# Patient Record
Sex: Male | Born: 1960
Health system: Southern US, Community
[De-identification: ages and names within clinical notes are randomized; demographics above are authoritative.]

## PROBLEM LIST (undated history)

## (undated) DIAGNOSIS — E119 Type 2 diabetes mellitus without complications: Secondary | ICD-10-CM

## (undated) DIAGNOSIS — Z87891 Personal history of nicotine dependence: Secondary | ICD-10-CM

## (undated) DIAGNOSIS — I639 Cerebral infarction, unspecified: Secondary | ICD-10-CM

## (undated) DIAGNOSIS — G4730 Sleep apnea, unspecified: Secondary | ICD-10-CM

## (undated) DIAGNOSIS — M109 Gout, unspecified: Secondary | ICD-10-CM

## (undated) DIAGNOSIS — E785 Hyperlipidemia, unspecified: Secondary | ICD-10-CM

## (undated) DIAGNOSIS — Z972 Presence of dental prosthetic device (complete) (partial): Secondary | ICD-10-CM

## (undated) DIAGNOSIS — E669 Obesity, unspecified: Secondary | ICD-10-CM

## (undated) DIAGNOSIS — I10 Essential (primary) hypertension: Secondary | ICD-10-CM

## (undated) HISTORY — DX: Hyperlipidemia, unspecified: E78.5

## (undated) HISTORY — DX: Personal history of nicotine dependence: Z87.891

## (undated) HISTORY — DX: Presence of dental prosthetic device (complete) (partial): Z97.2

## (undated) HISTORY — DX: Type 2 diabetes mellitus without complications: E11.9

## (undated) HISTORY — DX: Obesity, unspecified: E66.9

## (undated) HISTORY — DX: Cerebral infarction, unspecified: I63.9

## (undated) HISTORY — DX: Essential (primary) hypertension: I10

## (undated) HISTORY — PX: MOUTH SURGERY: SHX715

## (undated) HISTORY — PX: WISDOM TOOTH EXTRACTION: SHX21

## (undated) HISTORY — DX: Gout, unspecified: M10.9

## (undated) HISTORY — DX: Sleep apnea, unspecified: G47.30

---

## 2011-07-07 ENCOUNTER — Encounter: Payer: Self-pay | Admitting: Family Medicine

## 2011-07-07 ENCOUNTER — Ambulatory Visit (INDEPENDENT_AMBULATORY_CARE_PROVIDER_SITE_OTHER): Payer: BC Managed Care – PPO | Admitting: Family Medicine

## 2011-07-07 VITALS — BP 150/100 | HR 68 | Temp 98.8°F | Ht 68.0 in | Wt 234.8 lb

## 2011-07-07 DIAGNOSIS — I10 Essential (primary) hypertension: Secondary | ICD-10-CM | POA: Insufficient documentation

## 2011-07-07 DIAGNOSIS — R21 Rash and other nonspecific skin eruption: Secondary | ICD-10-CM | POA: Insufficient documentation

## 2011-07-07 NOTE — Patient Instructions (Signed)
Good to meet you today, call us with questions. Return at your convenience fasting for blood work, afterwards for physical. Pass by Marion's office to schedule dermatology appointment. I will request records from prior doc.

## 2011-07-07 NOTE — Assessment & Plan Note (Signed)
Longstanding.  Unclear etiology.  Not clearly fungal or eczematous.  Nonvesicular.   ?rxn to rubber vs eczematous dermatitis although papular/pustular description not consistent with this or in typical distribution of eczema.   lichenification behind ears and mid back patch from excessive scratching. Not improved with low potency steroid. Will refer to derm for further input.

## 2011-07-07 NOTE — Assessment & Plan Note (Addendum)
Discussed monitoring bp at home, if staying elevated to return sooner.  Pt endorses compliance with meds, states bp always elevated with doctor visits, states at home runs 130/80s. Return fasting for bloodwork and afterwards for CPE.

## 2011-07-07 NOTE — Progress Notes (Signed)
Subjective:    Patient ID: Thomas Howard, male    DOB: August 15, 1960, 51 y.o.   MRN: [id number]  HPI CC: new pt, rash  Prior saw Dr. Iris Pert in [place].  Rash - started last summer, thought mosquito bites.  Never fully healed.  Stinging and itchy rash.  Started left leg spreading to arms and torso and scalp now.  No new lotions, detergents, soaps, shampoos.  Worse itch with hot weather.  No fevers/chills, abd pain, n/v, oral lesions, joint pains.  States lesions start out as pimples.  Has been using vaseline to keep from skin drying.   Has been using cortisone (cortaid and caldecort) and anti itch creams which help but only temporarily. No h/o eczema.  Works for [employer] ?rubber rash.  Preventative: Tetanus unsure  Caffeine: sodas - 6 pack/day (pepsi, mt dew, ginger ale) Lives with GF, 1 cat.  Divorced Occupation: [employer] Activity: [employer] Diet: some water, rare fruits/vegetables  Medications and allergies reviewed and updated in chart.  Past histories reviewed and updated if relevant as below. Patient Active Problem List  Diagnoses  . Skin rash   Past Medical History  Diagnosis Date  . HTN (hypertension)   . Wears partial dentures    Past Surgical History  Procedure Date  . Wisdom tooth extraction   . Mouth surgery    History  Substance Use Topics  . Smoking status: Former Smoker    Types: Cigarettes    Quit date: 03/12/2011  . Smokeless tobacco: Never Used   Comment: still use e-cigs  . Alcohol Use: Yes     Occasional   Family History  Problem Relation Age of Onset  . Diabetes Mother   . Hypertension Mother   . Diabetes Maternal Grandmother   . Hypertension Maternal Grandmother   . Coronary artery disease Neg Hx   . Stroke Neg Hx   . Cancer Neg Hx    No Known Allergies No current outpatient prescriptions on file prior to visit.   Review of Systems  Constitutional: Negative for fever, chills, activity  change, appetite change, fatigue and unexpected weight change.  HENT: Negative for hearing loss and neck pain.   Eyes: Negative for visual disturbance.  Respiratory: Negative for cough, chest tightness, shortness of breath and wheezing.   Cardiovascular: Negative for chest pain, palpitations and leg swelling.  Gastrointestinal: Negative for nausea, vomiting, abdominal pain, diarrhea, constipation, blood in stool and abdominal distention.  Genitourinary: Negative for hematuria and difficulty urinating.  Musculoskeletal: Negative for myalgias and arthralgias.  Skin: Negative for rash.  Neurological: Negative for dizziness, seizures, syncope and headaches.  Hematological: Does not bruise/bleed easily.  Psychiatric/Behavioral: Negative for dysphoric mood. The patient is not nervous/anxious.        Objective:   Physical Exam  Nursing note and vitals reviewed. Constitutional: He is oriented to person, place, and time. He appears well-developed and well-nourished. No distress.  HENT:  Head: Normocephalic and atraumatic.  Right Ear: External ear normal.  Left Ear: External ear normal.  Nose: Nose normal.  Mouth/Throat: Oropharynx is clear and moist. No oropharyngeal exudate.  Eyes: Conjunctivae and EOM are normal. Pupils are equal, round, and reactive to light. No scleral icterus.  Neck: Normal range of motion. Neck supple. No thyromegaly present.  Cardiovascular: Normal rate, regular rhythm, normal heart sounds and intact distal pulses.   No murmur heard. Pulses:      Radial pulses are 2+ on the right side, and  2+ on the left side.  Pulmonary/Chest: Effort normal and breath sounds normal. No respiratory distress. He has no wheezes. He has no rales.  Abdominal: Soft. Bowel sounds are normal. He exhibits no distension and no mass. There is no tenderness. There is no rebound and no guarding.  Musculoskeletal: Normal range of motion. He exhibits no edema.  Lymphadenopathy:    He has no  cervical adenopathy.  Neurological: He is alert and oriented to person, place, and time.       CN grossly intact, station and gait intact  Skin: Skin is warm and dry. No rash noted.       Pruritic papular rash throughout body, old lesions with residual hyperpigmentation more on back and left leg.  Some papules on scalp as well. Patch of eczematous skin midline lower thoracic back. Some skin thickening posterior to bilateral ears L>R.  Psychiatric: He has a normal mood and affect. His behavior is normal. Judgment and thought content normal.      Assessment & Plan:

## 2011-12-15 ENCOUNTER — Encounter: Payer: Self-pay | Admitting: Family Medicine

## 2011-12-15 ENCOUNTER — Ambulatory Visit (INDEPENDENT_AMBULATORY_CARE_PROVIDER_SITE_OTHER): Payer: BC Managed Care – PPO | Admitting: Family Medicine

## 2011-12-15 VITALS — BP 138/84 | HR 78 | Temp 98.2°F | Wt 240.8 lb

## 2011-12-15 DIAGNOSIS — I10 Essential (primary) hypertension: Secondary | ICD-10-CM

## 2011-12-15 DIAGNOSIS — R21 Rash and other nonspecific skin eruption: Secondary | ICD-10-CM

## 2011-12-15 DIAGNOSIS — E669 Obesity, unspecified: Secondary | ICD-10-CM

## 2011-12-15 LAB — BASIC METABOLIC PANEL
BUN: 13 mg/dL (ref 6–23)
CO2: 29 mEq/L (ref 19–32)
Chloride: 99 mEq/L (ref 96–112)
Creatinine, Ser: 1 mg/dL (ref 0.4–1.5)
Glucose, Bld: 117 mg/dL — ABNORMAL HIGH (ref 70–99)
Potassium: 3.9 mEq/L (ref 3.5–5.1)

## 2011-12-15 LAB — TSH: TSH: 1.13 u[IU]/mL (ref 0.35–5.50)

## 2011-12-15 LAB — LIPID PANEL
Cholesterol: 184 mg/dL (ref 0–200)
LDL Cholesterol: 123 mg/dL — ABNORMAL HIGH (ref 0–99)
Triglycerides: 79 mg/dL (ref 0.0–149.0)

## 2011-12-15 MED ORDER — ENALAPRIL MALEATE 20 MG PO TABS: 20.0000 mg | ORAL_TABLET | Freq: Every day | ORAL | Status: DC

## 2011-12-15 MED ORDER — HYDROCHLOROTHIAZIDE 25 MG PO TABS: 25.0000 mg | ORAL_TABLET | Freq: Every day | ORAL | Status: DC | PRN

## 2011-12-15 NOTE — Assessment & Plan Note (Signed)
Has seen derm for this.

## 2011-12-15 NOTE — Progress Notes (Signed)
  Subjective:    Patient ID: Thomas Howard, male    DOB: Nov 02, 1960, 51 y.o.   MRN: [id number]  HPI CC: med refill  HTN - compliant enalapril and hydrochlorothiazide.  No HA, vision changes, CP/tightness, SOB, leg swelling.  Last vision check was last year.  Trouble with near vision.   BP Readings from Last 3 Encounters:  12/15/11 138/84  07/07/11 150/100   Smoking - still abstinent.  Wt Readings from Last 3 Encounters:  12/15/11 240 lb 12 oz (109.203 kg)  07/07/11 234 lb 12 oz (106.482 kg)  up 6 lbs.  Attributes to increased appetite after quitting smoking. Has increased water compared to sodas.  Works second shift.  Takes bp meds at ~12am. Waking up to go to bathroom 1x/night. Wonders about frequency.    fmhx diabetes and HTN in family.  Last CPE unsure. Fasting today.  Review of Systems Per HPI    Objective:   Physical Exam  Nursing note and vitals reviewed. Constitutional: He appears well-developed and well-nourished. No distress.  HENT:  Head: Normocephalic and atraumatic.  Mouth/Throat: Oropharynx is clear and moist. No oropharyngeal exudate.  Eyes: Conjunctivae and EOM are normal. Pupils are equal, round, and reactive to light. No scleral icterus.  Neck: Normal range of motion. Neck supple. Carotid bruit is not present.  Cardiovascular: Normal rate, regular rhythm, normal heart sounds and intact distal pulses.   No murmur heard. Pulmonary/Chest: Breath sounds normal. No respiratory distress. He has no wheezes. He has no rales.  Musculoskeletal: He exhibits no edema.  Skin: Skin is warm and dry. No rash noted.       Assessment & Plan:

## 2011-12-15 NOTE — Patient Instructions (Signed)
Blood work today.  We will call you with results. meds refilled for 1 year. Return in 1 year for physical

## 2011-12-15 NOTE — Assessment & Plan Note (Signed)
Weight gain noted. Wt Readings from Last 3 Encounters:  12/15/11 240 lb 12 oz (109.203 kg)  07/07/11 234 lb 12 oz (106.482 kg)  encouraged continued activity, watching diet.  Congratulated on changing sodas to water. Check FLP and glucose today ,as fasting.

## 2011-12-15 NOTE — Assessment & Plan Note (Signed)
Chronic, stable.  Continue meds.  BP Readings from Last 3 Encounters:  12/15/11 138/84  07/07/11 150/100

## 2013-06-20 ENCOUNTER — Ambulatory Visit (INDEPENDENT_AMBULATORY_CARE_PROVIDER_SITE_OTHER): Payer: BC Managed Care – PPO | Admitting: Family Medicine

## 2013-06-20 ENCOUNTER — Encounter: Payer: Self-pay | Admitting: Family Medicine

## 2013-06-20 VITALS — BP 164/102 | HR 82 | Temp 98.2°F | Ht 68.0 in | Wt 246.0 lb

## 2013-06-20 DIAGNOSIS — Z125 Encounter for screening for malignant neoplasm of prostate: Secondary | ICD-10-CM

## 2013-06-20 DIAGNOSIS — R21 Rash and other nonspecific skin eruption: Secondary | ICD-10-CM

## 2013-06-20 DIAGNOSIS — Z0000 Encounter for general adult medical examination without abnormal findings: Secondary | ICD-10-CM

## 2013-06-20 DIAGNOSIS — E669 Obesity, unspecified: Secondary | ICD-10-CM

## 2013-06-20 DIAGNOSIS — Z1211 Encounter for screening for malignant neoplasm of colon: Secondary | ICD-10-CM

## 2013-06-20 DIAGNOSIS — I10 Essential (primary) hypertension: Secondary | ICD-10-CM

## 2013-06-20 DIAGNOSIS — Z23 Encounter for immunization: Secondary | ICD-10-CM

## 2013-06-20 LAB — COMPREHENSIVE METABOLIC PANEL
ALBUMIN: 4 g/dL (ref 3.5–5.2)
ALT: 34 U/L (ref 0–53)
AST: 28 U/L (ref 0–37)
Alkaline Phosphatase: 88 U/L (ref 39–117)
BUN: 13 mg/dL (ref 6–23)
CALCIUM: 9.4 mg/dL (ref 8.4–10.5)
CHLORIDE: 104 meq/L (ref 96–112)
CO2: 26 meq/L (ref 19–32)
Creatinine, Ser: 1 mg/dL (ref 0.4–1.5)
GFR: 98.13 mL/min (ref >60.00)
Glucose, Bld: 123 mg/dL — ABNORMAL HIGH (ref 70–99)
POTASSIUM: 3.9 meq/L (ref 3.5–5.1)
Sodium: 138 mEq/L (ref 135–145)
Total Bilirubin: 1.2 mg/dL (ref 0.3–1.2)
Total Protein: 7.6 g/dL (ref 6.0–8.3)

## 2013-06-20 LAB — CBC WITH DIFFERENTIAL/PLATELET
BASOS PCT: 0.7 % (ref 0.0–3.0)
Basophils Absolute: 0 10*3/uL (ref 0.0–0.1)
EOS ABS: 0.2 10*3/uL (ref 0.0–0.7)
Eosinophils Relative: 4 % (ref 0.0–5.0)
HCT: 44.4 % (ref 39.0–52.0)
HEMOGLOBIN: 14.4 g/dL (ref 13.0–17.0)
LYMPHS PCT: 30.5 % (ref 12.0–46.0)
Lymphs Abs: 1.3 10*3/uL (ref 0.7–4.0)
MCHC: 32.5 g/dL (ref 30.0–36.0)
MCV: 89.1 fl (ref 78.0–100.0)
Monocytes Absolute: 0.3 10*3/uL (ref 0.1–1.0)
Monocytes Relative: 7.7 % (ref 3.0–12.0)
NEUTROS ABS: 2.5 10*3/uL (ref 1.4–7.7)
NEUTROS PCT: 57.1 % (ref 43.0–77.0)
Platelets: 298 10*3/uL (ref 150.0–400.0)
RBC: 4.98 Mil/uL (ref 4.22–5.81)
RDW: 13.5 % (ref 11.5–14.6)
WBC: 4.4 10*3/uL — ABNORMAL LOW (ref 4.5–10.5)

## 2013-06-20 LAB — LIPID PANEL
CHOL/HDL RATIO: 5
Cholesterol: 186 mg/dL (ref 0–200)
HDL: 37.9 mg/dL — ABNORMAL LOW (ref >39.00)
LDL CALC: 125 mg/dL — AB (ref 0–99)
TRIGLYCERIDES: 116 mg/dL (ref 0.0–149.0)
VLDL: 23.2 mg/dL (ref 0.0–40.0)

## 2013-06-20 LAB — PSA: PSA: 0.89 ng/mL (ref 0.10–4.00)

## 2013-06-20 MED ORDER — ENALAPRIL MALEATE 20 MG PO TABS
20.0000 mg | ORAL_TABLET | Freq: Every day | ORAL | Status: DC
Start: 2013-06-20 — End: 2014-06-24

## 2013-06-20 MED ORDER — CLOTRIMAZOLE 1 % EX CREA: 1.0000 "application " | TOPICAL_CREAM | Freq: Two times a day (BID) | CUTANEOUS | Status: DC

## 2013-06-20 MED ORDER — HYDROCHLOROTHIAZIDE 25 MG PO TABS: 25.0000 mg | ORAL_TABLET | Freq: Every day | ORAL | Status: DC | PRN

## 2013-06-20 NOTE — Progress Notes (Signed)
BP 164/102  Pulse 82  Temp(Src) 98.2 F (36.8 C) (Oral)  Ht 5\' 8"  (1.727 m)  Wt 246 lb (111.585 kg)  BMI 37.41 kg/m2   CC: CPE  Subjective:    Patient ID: Thomas Howard, male    DOB: Aug 03, 1960, 53 y.o.   MRN: [id number]  HPI: Thomas Howard is a 53 y.o. male presenting on 06/20/2013 with Annual Exam  HTN - Not compliant with current antihypertensive regimen.  Off meds for over a year.  Does not check blood pressures at home.  No low blood pressure readings or symptoms of dizziness/syncope.  Denies HA, vision changes, CP/tightness, SOB, leg swelling. BP Readings from Last 3 Encounters:  06/20/13 164/102  12/15/11 138/84  07/07/11 150/100   Rash has returned over last 3 months.  Now on bottom and finds significant itching/burning with evacuation that has led to raw skin and some bleeding.  Using medicated wipes (rexall - possibly with witch hazel).  He did see dermatologist for this over a year ago, told dry skin and treated with some topical medicine.  Wt Readings from Last 3 Encounters:  06/20/13 246 lb (111.585 kg)  12/15/11 240 lb 12 oz (109.203 kg)  07/07/11 234 lb 12 oz (106.482 kg)    Preventative:  Colon cancer screening - discussed, would like colonoscopy - will refer Prostate cancer screening - discussed, would like to screen. Tetanus - unsure > 10 yrs ago.  Would like today. Flu shot - declines  Caffeine: sodas - 6 pack/day (pepsi, mt dew, ginger ale)  Lives with GF, 1 cat. Divorced  Occupation: [employer]  Activity: [employer]  Diet: more water, decreasing sodas, some fruits/vegetables   Relevant past medical, surgical, family and social history reviewed and updated. Allergies and medications reviewed and updated. No current outpatient prescriptions on file prior to visit.   No current facility-administered medications on file prior to visit.    Review of Systems  Constitutional: Negative for fever, chills, activity  change, appetite change, fatigue and unexpected weight change.  HENT: Negative for hearing loss.   Eyes: Negative for visual disturbance.  Respiratory: Negative for cough, chest tightness, shortness of breath and wheezing.   Cardiovascular: Negative for chest pain, palpitations and leg swelling.  Gastrointestinal: Negative for nausea, vomiting, abdominal pain, diarrhea, constipation, blood in stool and abdominal distention.  Genitourinary: Negative for hematuria and difficulty urinating.  Musculoskeletal: Negative for arthralgias, myalgias and neck pain.  Skin: Negative for rash.  Neurological: Negative for dizziness, seizures, syncope and headaches.  Hematological: Negative for adenopathy. Does not bruise/bleed easily.  Psychiatric/Behavioral: Negative for dysphoric mood. The patient is not nervous/anxious.    Per HPI unless specifically indicated above    Objective:    BP 164/102  Pulse 82  Temp(Src) 98.2 F (36.8 C) (Oral)  Ht 5\' 8"  (1.727 m)  Wt 246 lb (111.585 kg)  BMI 37.41 kg/m2  Physical Exam  Nursing note and vitals reviewed. Constitutional: He is oriented to person, place, and time. He appears well-developed and well-nourished. No distress.  obese  HENT:  Head: Normocephalic and atraumatic.  Right Ear: Hearing, tympanic membrane, external ear and ear canal normal.  Left Ear: Hearing, tympanic membrane, external ear and ear canal normal.  Nose: Nose normal.  Mouth/Throat: Uvula is midline, oropharynx is clear and moist and mucous membranes are normal. No oropharyngeal exudate, posterior oropharyngeal edema or posterior oropharyngeal erythema.  Eyes: Conjunctivae and EOM are normal. Pupils are equal, round, and  reactive to light. No scleral icterus.  Neck: Normal range of motion. Neck supple.  Cardiovascular: Normal rate, regular rhythm, normal heart sounds and intact distal pulses.   No murmur heard. Pulses:      Radial pulses are 2+ on the right side, and 2+ on the  left side.  Pulmonary/Chest: Effort normal and breath sounds normal. No respiratory distress. He has no wheezes. He has no rales.  Abdominal: Soft. Bowel sounds are normal. He exhibits no distension and no mass. There is no tenderness. There is no rebound and no guarding.  Genitourinary: Rectal exam shows external hemorrhoid (non inflamed anterior). Rectal exam shows no internal hemorrhoid, no fissure, no mass, no tenderness and anal tone normal. Prostate is enlarged (20gm). Prostate is not tender.  Some hyperpigmentation perianally, pruritic  Musculoskeletal: Normal range of motion. He exhibits no edema.  Lymphadenopathy:    He has no cervical adenopathy.  Neurological: He is alert and oriented to person, place, and time.  CN grossly intact, station and gait intact  Skin: Skin is warm and dry. Rash noted.  Hyperpigmented pruritic rash - isolated papules on trunk, extremities - with excoriation  Psychiatric: He has a normal mood and affect. His behavior is normal. Judgment and thought content normal.      Assessment & Plan:   Problem List Items Addressed This Visit   Health care maintenance - Primary     Preventative protocols reviewed and updated unless pt declined. Discussed healthy diet and lifestyle.  DRE reassuring with some BPH evident.  Check PSA. Refer for colonsocopy today    Relevant Orders      CBC with Differential   HTN (hypertension)     Noncompliant with meds - encouraged restarting.  Will restart ACEI for 1 wk then restart HCTZ. Discussed reasons to treat HTN.    Relevant Medications      enalapril (VASOTEC) tablet      hydrochlorothiazide tablet   Other Relevant Orders      Comprehensive metabolic panel   Obesity     Reviewed weight gain.    Relevant Orders      Lipid panel      Comprehensive metabolic panel   Skin rash     Longstanding. Unclear etiology. Comes and goes.  Now affecting perianal region.  ?fungal intertrigo - treat with clotrimazole bid x 3-4  wks. Nonvesicular.  ?eczematous dermatitis If not better with antifungal, will refer back to derm.  Pt agrees.    Relevant Orders      Comprehensive metabolic panel      RPR    Other Visit Diagnoses   Special screening for malignant neoplasm of prostate        Relevant Orders       PSA    Special screening for malignant neoplasms, colon        Relevant Orders       Ambulatory referral to Gastroenterology        Follow up plan: Return in about 3 months (around 09/17/2013), or as needed, for follow up visit.

## 2013-06-20 NOTE — Assessment & Plan Note (Signed)
Longstanding. Unclear etiology. Comes and goes.  Now affecting perianal region.  ?fungal intertrigo - treat with clotrimazole bid x 3-4 wks. Nonvesicular.  ?eczematous dermatitis If not better with antifungal, will refer back to derm.  Pt agrees.

## 2013-06-20 NOTE — Assessment & Plan Note (Addendum)
Noncompliant with meds - encouraged restarting.  Will restart ACEI for 1 wk then restart HCTZ. Discussed reasons to treat HTN. RTC 3 mo for HTN f/u.

## 2013-06-20 NOTE — Addendum Note (Signed)
Addended by: Josph MachoANCE, Zymier Rodgers A on: 06/20/2013 12:24 PM   Modules accepted: Orders

## 2013-06-20 NOTE — Assessment & Plan Note (Signed)
Reviewed weight gain.

## 2013-06-20 NOTE — Assessment & Plan Note (Signed)
Preventative protocols reviewed and updated unless pt declined. Discussed healthy diet and lifestyle.  DRE reassuring with some BPH evident.  Check PSA. Refer for colonsocopy today

## 2013-06-20 NOTE — Patient Instructions (Signed)
Prostate checked today - blood work today. We will call you to schedule screening colonoscopy. Tetanus shot (Tdap). I've refilled your blood pressure medicines.  Restart lisinopril 20mg  daily for 1 week then restart hydrochlorothiazide 25mg  daily.  Monitor blood pressure closely while restarting these medicines. For rash around bottom - let's try antifungal cream for next 2-3 weeks - twice daily.  If not improving with this, I will refer you to skin doctor for further evaluation.

## 2013-06-21 ENCOUNTER — Telehealth: Payer: Self-pay | Admitting: Family Medicine

## 2013-06-21 LAB — RPR

## 2013-06-21 NOTE — Telephone Encounter (Signed)
Relevant patient education mailed to patient.  

## 2013-06-26 ENCOUNTER — Encounter: Payer: Self-pay | Admitting: *Deleted

## 2013-08-03 ENCOUNTER — Encounter: Payer: Self-pay | Admitting: Family Medicine

## 2013-09-18 ENCOUNTER — Ambulatory Visit: Payer: BC Managed Care – PPO | Admitting: Family Medicine

## 2013-09-18 DIAGNOSIS — Z0289 Encounter for other administrative examinations: Secondary | ICD-10-CM

## 2014-06-24 ENCOUNTER — Other Ambulatory Visit: Payer: Self-pay | Admitting: Family Medicine

## 2014-07-31 ENCOUNTER — Other Ambulatory Visit: Payer: Self-pay | Admitting: Family Medicine

## 2017-09-22 LAB — CBC AND DIFFERENTIAL
HEMOGLOBIN: 13.3 — AB (ref 13.5–17.5)
Platelets: 224 (ref 150–399)
WBC: 6.9

## 2017-09-22 LAB — BASIC METABOLIC PANEL
Creatinine: 1.1 (ref 0.6–1.3)
Glucose: 95

## 2017-09-22 LAB — LIPID PANEL
Cholesterol: 193 (ref 0–200)
HDL: 41 (ref 35–70)
LDL Cholesterol: 126
TRIGLYCERIDES: 123 (ref 40–160)

## 2017-09-22 LAB — HEPATIC FUNCTION PANEL
ALK PHOS: 110 (ref 25–125)
ALT: 34 (ref 10–40)
AST: 19 (ref 14–40)

## 2017-09-24 ENCOUNTER — Telehealth: Payer: Self-pay | Admitting: Family Medicine

## 2017-09-24 NOTE — Telephone Encounter (Signed)
See below  [facility] to schedule  Copied from CRM [id number]. Topic: Appointment Scheduling - Scheduling Inquiry for Clinic >> Sep 24, 2017 10:02 AM Crist Infante wrote: Reason for CRM: pt is being released from [facility] possibly today or next few days. April Blackwell calling for the pt and would like to know if Dr Rudene Re will accept him back as a pt? Last seen 06/20/2013. April states pt is going to need PT, ST a neuro referral and bp monitoring. Please advise if Dr Reece Agar will take him back.

## 2017-09-26 NOTE — Telephone Encounter (Signed)
Ok to schedule in any available 30 min appt this week.  °plz have him bring hospital records °

## 2017-09-27 NOTE — Telephone Encounter (Signed)
Appointment [phone]

## 2017-09-27 NOTE — Telephone Encounter (Addendum)
Thomas Howard was cold transferred to me; pt was at [facility] with CVA and D/C on 09/25/17. Anglea at [facility] scheduled 30' hospital f/u on 09/30/17 at 9 AM. Thomas voiced understanding and will have pt bring his hospital records. If pt condition changes or worsens prior to appt Thomas will cb or take pt to ED. FYI to Dr Reece Agar.

## 2017-09-27 NOTE — Telephone Encounter (Signed)
Left message asking pt to call office  °

## 2017-09-27 NOTE — Telephone Encounter (Signed)
Ok to schedule in any available 30 min appt this week.  plz have him bring hospital records

## 2017-09-30 ENCOUNTER — Encounter: Payer: Self-pay | Admitting: Family Medicine

## 2017-09-30 ENCOUNTER — Other Ambulatory Visit: Payer: Self-pay | Admitting: Family Medicine

## 2017-09-30 ENCOUNTER — Ambulatory Visit: Payer: BLUE CROSS/BLUE SHIELD | Admitting: Family Medicine

## 2017-09-30 VITALS — BP 166/104 | HR 83 | Temp 98.5°F | Ht 68.0 in | Wt 236.5 lb

## 2017-09-30 DIAGNOSIS — I6612 Occlusion and stenosis of left anterior cerebral artery: Secondary | ICD-10-CM

## 2017-09-30 DIAGNOSIS — I639 Cerebral infarction, unspecified: Secondary | ICD-10-CM | POA: Diagnosis not present

## 2017-09-30 DIAGNOSIS — I6930 Unspecified sequelae of cerebral infarction: Secondary | ICD-10-CM | POA: Insufficient documentation

## 2017-09-30 DIAGNOSIS — G4733 Obstructive sleep apnea (adult) (pediatric): Secondary | ICD-10-CM

## 2017-09-30 DIAGNOSIS — I10 Essential (primary) hypertension: Secondary | ICD-10-CM | POA: Diagnosis not present

## 2017-09-30 DIAGNOSIS — F17200 Nicotine dependence, unspecified, uncomplicated: Secondary | ICD-10-CM

## 2017-09-30 NOTE — Progress Notes (Signed)
BP (!) 166/104 (BP Location: Right Arm, Cuff Size: Large)   Pulse 83   Temp 98.5 F (36.9 C) (Oral)   Ht  (1.727 m)   Wt 236 lb 8 oz (107.3 kg)   SpO2 97%   BMI 35.96 kg/m    CC: hosp f/u visit Subjective:    Patient ID: Thomas Howard, male    DOB: 16-May-1960, 57 y.o.   MRN: [id number]  HPI: Thomas Howard is a 57 y.o. male presenting on 09/30/2017 for Hospitalization Follow-up (States he was admitted to [facility] about 2 wks ago due to stroke. Provided d/c summary. Pt accompanied by significant other, April.)   Last seen here 06/2013 at that time had not been taking antihypertensives.   Recent hospitalization 5/15-18/2019 at [facility] with acute stroke. Records reviewed. Admitted with BP 240/113, sudden onset slurred speech, L facial drooping with drooling and gait unsteadiness. Head CT non acute, carotid US ok, CTA showed hypoplastic or occluded A1 segment of L ACA. MRI showed acute nonhemorrhagic R basal ganglia lacunar infarct. Discharged on nicoderm CQ, plavix, aspirin , atorvastatin 80, lisinopril  daily, and hctz  daily. PT recommended outpatient PT twice weekly. rec restart CPAP for OSA.   Slurred speech is improving, unsteadiness and drooling persists. Larey Seat out of bed last night.   Previously saw [facility] but not recently.  OSA - has not been using CPAP. Doesn't have CPAP machine at home.  Tobacco use - ~1 ppd. Has just smoked 1 cig today. Discharged with patch.  Denies HA, vision changes, CP/tightness, SOB, leg swelling.  Interested in changing lisinopril antihypertensive.   Compliant with discharge medication regimen.   rec outpt neurology f/u (Dr Vernice Jefferson) and outpatient PT.   Relevant past medical, surgical, family and social history reviewed and updated as indicated. Interim medical history since our last visit reviewed. Allergies and medications reviewed and updated. Outpatient Medications Prior to Visit  Medication Sig  Dispense Refill  . aspirin 81 MG EC tablet Take 81 mg by mouth daily.  0  . atorvastatin (LIPITOR) 80 MG tablet Take 80 mg by mouth at bedtime.  0  . clopidogrel (PLAVIX) 75 MG tablet Take 1 tablet by mouth daily.    . hydrochlorothiazide (HYDRODIURIL) 50 MG tablet Take 50 mg by mouth daily.  0  . lisinopril (PRINIVIL,ZESTRIL) 40 MG tablet Take 40 mg by mouth daily.  0  . nicotine (NICODERM CQ - DOSED IN MG/24 HOURS) 21 mg/24hr patch Place 21 mg onto the skin daily.    . clotrimazole (LOTRIMIN) 1 % cream Apply 1 application topically 2 (two) times daily. To AA x 3-4 weeks 60 g 0  . enalapril (VASOTEC) 20 MG tablet Take one tablet daily **MUST HAVE PHYSICAL FOR FURTHER REFILLS** 30 tablet 0  . hydrochlorothiazide (HYDRODIURIL) 25 MG tablet Take one tablet daily **MUST HAVE PHYSICAL FOR FURTHER REFILLS** 30 tablet 0   No facility-administered medications prior to visit.      Per HPI unless specifically indicated in ROS section below Review of Systems     Objective:    BP (!) 166/104 (BP Location: Right Arm, Cuff Size: Large)   Pulse 83   Temp 98.5 F (36.9 C) (Oral)   Ht  (1.727 m)   Wt 236 lb 8 oz (107.3 kg)   SpO2 97%   BMI 35.96 kg/m   Wt Readings from Last 3 Encounters:  09/30/17 236 lb 8 oz (107.3 kg)  06/20/13 246 lb (111.6 kg)  12/15/11 240 lb 12 oz (109.2 kg)    Physical Exam  Constitutional: He appears well-developed and well-nourished. No distress.  HENT:  Head: Normocephalic and atraumatic.  Mouth/Throat: Oropharynx is clear and moist. No oropharyngeal exudate.  Eyes: Pupils are equal, round, and reactive to light. Conjunctivae and EOM are normal. No scleral icterus.  Neck: Normal range of motion. Neck supple. Carotid bruit is not present.  Cardiovascular: Normal rate, regular rhythm, normal heart sounds and intact distal pulses.  No murmur heard. Pulmonary/Chest: Breath sounds normal. No respiratory distress. He has no wheezes. He has no rales.    Musculoskeletal: He exhibits no edema.  Neurological: He is alert. A cranial nerve deficit is present. No sensory deficit. He displays a negative Romberg sign. Coordination and gait normal.  Mild left facial droop Mild residual slurred speech 5/5 strength RUE, 4+/5 LUE Mild pronator drift on left  Skin: Skin is warm and dry. No rash noted.  Vitals reviewed.  Results for orders placed or performed in visit on 09/30/17  CBC and differential  Result Value Ref Range   Hemoglobin 13.3 (A) 13.5 - 17.5   Platelets 224 150 - 399   WBC 6.9   Basic metabolic panel  Result Value Ref Range   Glucose 95    Creatinine 1.1 0.6 - 1.3  Lipid panel  Result Value Ref Range   Triglycerides 123 40 - 160   Cholesterol 193 0 - 200   HDL 41 35 - 70   LDL Cholesterol 126   Hepatic function panel  Result Value Ref Range   Alkaline Phosphatase 110 25 - 125   ALT 34 10 - 40   AST 19 14 - 40      Assessment & Plan:   Problem List Items Addressed This Visit    Acute cerebral infarction (HCC) - Primary    Recently suffered R acute nonhemorrhagic R basal ganglia lacunar infarct. Was not taking aspirin prior to stroke. Now on aspirin, plavix, high dose lipitor . Will refer to neurology. Given residual hemiparesis and deficits, will schedule for outpatient PT - this was recommended by PT evaluation in hospital. Work towards control of blood pressures.  I don't see where echo was obtained.       Relevant Orders   Ambulatory referral to Physical Therapy   Ambulatory referral to Neurology   HTN (hypertension)    Has restarted medications - bp remains elevated but slowly improving (from hospital highs). Will continue working toward slow BP control after recent ischemic infarct. Pt states he will likely want to come off lisinopril. Consider edarbi/chlorthalidone. Will RTC 2 wks for continued close f/u. RTC 2 wks lab visit only to check Cr.      Relevant Medications   aspirin 81 MG EC tablet    atorvastatin (LIPITOR) 80 MG tablet   hydrochlorothiazide (HYDRODIURIL) 50 MG tablet   lisinopril (PRINIVIL,ZESTRIL) 40 MG tablet   Occlusion and stenosis of left anterior cerebral artery   Relevant Medications   aspirin 81 MG EC tablet   atorvastatin (LIPITOR) 80 MG tablet   hydrochlorothiazide (HYDRODIURIL) 50 MG tablet   lisinopril (PRINIVIL,ZESTRIL) 40 MG tablet   OSA (obstructive sleep apnea)    Agrees to referral to sleep clinic for formal evaluation of OSA.       Relevant Orders   Ambulatory referral to Neurology   Ambulatory referral to Sleep Studies   Smoker    Prior 1 ppd, motivated for full cessation. Has been prescribed nicoderm  patch. Suggested next refill he buy and continue titration (every 1-2 wks).           No orders of the defined types were placed in this encounter.  Orders Placed This Encounter  Procedures  . CBC and differential    This external order was created through the Results Console.  . Basic metabolic panel    This external order was created through the Results Console.  . Lipid panel    This external order was created through the Results Console.  . Hepatic function panel    This external order was created through the Results Console.  . Ambulatory referral to Physical Therapy    Referral Priority:   Routine    Referral Type:   Physical Medicine    Referral Reason:   Specialty Services Required    Requested Specialty:   Physical Therapy    Number of Visits Requested:   1  . Ambulatory referral to Neurology    Referral Priority:   Routine    Referral Type:   Consultation    Referral Reason:   Specialty Services Required    Requested Specialty:   Neurology    Number of Visits Requested:   1  . Ambulatory referral to Sleep Studies    Referral Priority:   Routine    Referral Type:   Consultation    Referral Reason:   Specialty Services Required    Number of Visits Requested:   1    Follow up plan: Return in about 2 weeks  (around 10/14/2017) for follow up visit.  Eustaquio Boyden, MD

## 2017-09-30 NOTE — Patient Instructions (Addendum)
Next refill, buy patches for nicoderm.  Continue current medicines. Continue to monitor blood pressures and jot down number. Bring log to next visit.  See our referral coordinators to schedule appointments with neurology and physical therapy.  Return in 1 week for labs only Return in 2 weeks for follow up visit.

## 2017-09-30 NOTE — Assessment & Plan Note (Signed)
Prior 1 ppd, motivated for full cessation. Has been prescribed nicoderm patch. Suggested next refill he buy and continue titration (every 1-2 wks).

## 2017-09-30 NOTE — Assessment & Plan Note (Addendum)
Has restarted medications - bp remains elevated but slowly improving (from hospital highs). Will continue working toward slow BP control after recent ischemic infarct. Pt states he will likely want to come off lisinopril. Consider edarbi/chlorthalidone. Will RTC 2 wks for continued close f/u. RTC 2 wks lab visit only to check Cr.

## 2017-09-30 NOTE — Assessment & Plan Note (Signed)
Agrees to referral to sleep clinic for formal evaluation of OSA.

## 2017-09-30 NOTE — Assessment & Plan Note (Addendum)
Recently suffered R acute nonhemorrhagic R basal ganglia lacunar infarct. Was not taking aspirin prior to stroke. Now on aspirin, plavix, high dose lipitor . Will refer to neurology. Given residual hemiparesis and deficits, will schedule for outpatient PT - this was recommended by PT evaluation in hospital. Work towards control of blood pressures.  I don't see where echo was obtained.

## 2017-10-07 ENCOUNTER — Other Ambulatory Visit (INDEPENDENT_AMBULATORY_CARE_PROVIDER_SITE_OTHER): Payer: BLUE CROSS/BLUE SHIELD

## 2017-10-07 ENCOUNTER — Encounter: Payer: Self-pay | Admitting: Family Medicine

## 2017-10-07 DIAGNOSIS — I639 Cerebral infarction, unspecified: Secondary | ICD-10-CM

## 2017-10-07 LAB — BASIC METABOLIC PANEL
BUN: 22 mg/dL (ref 6–23)
CHLORIDE: 99 meq/L (ref 96–112)
CO2: 30 mEq/L (ref 19–32)
CREATININE: 1.07 mg/dL (ref 0.40–1.50)
Calcium: 9.5 mg/dL (ref 8.4–10.5)
GFR: 92.36 mL/min (ref >60.00)
Glucose, Bld: 276 mg/dL — ABNORMAL HIGH (ref 70–99)
POTASSIUM: 3.4 meq/L — AB (ref 3.5–5.1)
Sodium: 138 mEq/L (ref 135–145)

## 2017-10-07 LAB — TSH: TSH: 2.06 u[IU]/mL (ref 0.35–4.50)

## 2017-10-12 ENCOUNTER — Other Ambulatory Visit: Payer: Self-pay

## 2017-10-12 ENCOUNTER — Encounter: Payer: Self-pay | Admitting: Physical Therapy

## 2017-10-12 ENCOUNTER — Ambulatory Visit: Payer: BLUE CROSS/BLUE SHIELD | Attending: Family Medicine | Admitting: Physical Therapy

## 2017-10-12 ENCOUNTER — Telehealth: Payer: Self-pay | Admitting: Physical Therapy

## 2017-10-12 VITALS — BP 159/98 | HR 91

## 2017-10-12 DIAGNOSIS — R471 Dysarthria and anarthria: Secondary | ICD-10-CM | POA: Diagnosis not present

## 2017-10-12 DIAGNOSIS — R2689 Other abnormalities of gait and mobility: Secondary | ICD-10-CM | POA: Diagnosis not present

## 2017-10-12 DIAGNOSIS — R41842 Visuospatial deficit: Secondary | ICD-10-CM | POA: Diagnosis not present

## 2017-10-12 DIAGNOSIS — R2681 Unsteadiness on feet: Secondary | ICD-10-CM | POA: Diagnosis not present

## 2017-10-12 DIAGNOSIS — I69315 Cognitive social or emotional deficit following cerebral infarction: Secondary | ICD-10-CM | POA: Diagnosis not present

## 2017-10-12 DIAGNOSIS — M6281 Muscle weakness (generalized): Secondary | ICD-10-CM | POA: Diagnosis not present

## 2017-10-12 DIAGNOSIS — I69354 Hemiplegia and hemiparesis following cerebral infarction affecting left non-dominant side: Secondary | ICD-10-CM | POA: Insufficient documentation

## 2017-10-12 DIAGNOSIS — R41841 Cognitive communication deficit: Secondary | ICD-10-CM | POA: Diagnosis not present

## 2017-10-12 DIAGNOSIS — I639 Cerebral infarction, unspecified: Secondary | ICD-10-CM

## 2017-10-12 DIAGNOSIS — Z9181 History of falling: Secondary | ICD-10-CM | POA: Diagnosis not present

## 2017-10-12 DIAGNOSIS — R278 Other lack of coordination: Secondary | ICD-10-CM | POA: Diagnosis not present

## 2017-10-12 DIAGNOSIS — R293 Abnormal posture: Secondary | ICD-10-CM

## 2017-10-12 NOTE — Telephone Encounter (Signed)
Dr. Sharen HonesGutierrez, I performed a Physical Therapy evaluation on Thomas Howard this morning. He has deficits from his Acute right Basal ganglia lacunar infarct that indicate a need for Occupational Therapy and Speech Therapy. Can you please send referral to [facility] for OT & Speech evaluations? Thank you Vladimir Fasterobin Jadavion Spoelstra, PT, DPT Phone:  [phone]  Fax:  [phone] [facility] [address] [place], [place] [address]

## 2017-10-12 NOTE — Telephone Encounter (Signed)
Will do. Thanks for seeing him.

## 2017-10-12 NOTE — Telephone Encounter (Signed)
Fixed referral and routed it appropriately.

## 2017-10-12 NOTE — Telephone Encounter (Signed)
Routed to me.  I don't think I was involved in this.  Thanks.

## 2017-10-13 NOTE — Therapy (Signed)
[facility] [address] [place], [place], [address] Phone: [phone]   Fax:  [phone]  Physical Therapy Evaluation  Patient Details  Name: Thomas Howard MRN: [id number] Date of Birth: Aug 10, 1960 Referring Provider: Eustaquio BoydenJavier Gutierrez, MD   Encounter Date: 10/12/2017  PT End of Session - 10/12/17 2313    Visit Number  1    Number of Visits  9    Authorization Type  BCBS    PT Start Time  0945    PT Stop Time  1020    PT Time Calculation (min)  35 min    Equipment Utilized During Treatment  Gait belt    Activity Tolerance  Patient tolerated treatment well;Patient limited by fatigue    Behavior During Therapy  [facility] for tasks assessed/performed       Past Medical History:  Diagnosis Date  . History of smoking   . HTN (hypertension)   . Obesity   . Wears partial dentures     Past Surgical History:  Procedure Laterality Date  . MOUTH SURGERY    . WISDOM TOOTH EXTRACTION      Vitals:   10/12/17 1000  BP: (!) 159/98  Pulse: 91     Subjective Assessment - 10/12/17 0948    Subjective  This 57yo male was referred on 09/30/2017 by Eustaquio BoydenJavier Gutierrez, MD with acute cerebral infarction. He was hospitalized 09/22/17-09/25/17 [facility] with acute nonhemorrhagic Right basal ganglia lacunar infarct and HTN.    Patient is accompained by:  Family member Significant other, Thomas    Pertinent History  HTN, + smoker,    Limitations  Lifting;Standing;Walking;House hold activities    Patient Stated Goals  To not be so fatigued with every day activities.     Currently in Pain?  No/denies          10/12/17 0930  Assessment  Medical Diagnosis Acute cerebral Infarct  Referring Provider Eustaquio BoydenJavier Gutierrez, MD  Onset Date/Surgical Date 09/22/17  Hand Dominance Right  Prior Therapy None  Precautions  Precautions Fall  Balance Screen  Has the patient fallen in the past 6 months Yes  How many times? 1 (day of  CVA)  Has the patient had a decrease in activity level because of a fear of falling?  No  Is the patient reluctant to leave their home because of a fear of falling?  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  Finger Nose Finger Test LUE dysmetria & slowed motion  Heel Shin Test LLE unable to control motion  Posture/Postural Control  Posture/Postural Control Postural limitations  Postural Limitations Rounded Shoulders;Forward head;Increased lumbar lordosis;Flexed trunk;Weight shift right  Tone  Assessment Location LLE  ROM / Strength  AROM / PROM / Strength AROM;Strength  AROM  Overall AROM  Within functional limits for tasks performed  Strength  Overall Strength Deficits  Overall Strength Comments LUE decreased grip & strength (PT requested OT referral)  Strength Assessment Site Hip;Knee;Ankle  Right/Left Hip Left  Right/Left Knee Left  Right/Left Ankle Left  Left Hip Flexion 4/5  Left Hip Extension 3+/5  Left Hip ABduction 3+/5  Left Knee Flexion 4-/5  Left Knee  Extension 4/5  Left Ankle Dorsiflexion 4/5  Left Ankle Plantar Flexion 4-/5  Ambulation/Gait  Ambulation/Gait Yes  Ambulation/Gait Assistance 5: Supervision;4: Min assist (MinA with scanning & negotiating)  Ambulation Distance (Feet) 300 Feet  Assistive device None  Gait Pattern Step-through pattern;Decreased arm swing - left;Decreased step length - right;Decreased stance time - left;Decreased hip/knee flexion - left;Decreased weight shift to left;Left hip hike;Antalgic;Lateral hip instability;Trunk flexed;Wide base of support  Ambulation Surface Indoor;Level  Gait velocity 2.98 ft/sec  Stairs Yes  Stairs Assistance 5: Supervision  Stair Management Technique Two rails;Alternating pattern;Forwards  Number of Stairs 4  Standardized Balance Assessment  Standardized Balance Assessment Berg Balance Test  Berg Balance Test  Sit to Stand 4  Standing Unsupported 4  Sitting with Back Unsupported but Feet Supported on Floor or Stool 4  Stand to Sit 4  Transfers 4  Standing Unsupported with Eyes Closed 3  Standing Ubsupported with Feet Together 3  From Standing, Reach Forward with Outstretched Arm 3  From Standing Position, Pick up Object from Floor 3  From Standing Position, Turn to Look Behind Over each Shoulder 3  Turn 360 Degrees 2  Standing Unsupported, Alternately Place Feet on Step/Stool 1  Standing Unsupported, One Foot in Front 3  Standing on One Leg 1  Total Score 42  Functional Gait  Assessment  Gait assessed  Yes  Gait Level Surface 2  Change in Gait Speed 2  Gait with Horizontal Head Turns 1  Gait with Vertical Head Turns 2  Gait and Pivot Turn 1  Step Over Obstacle 1  Gait with Narrow Base of Support 1  Gait with Eyes Closed 1  Ambulating Backwards 1  Steps 2  Total Score 14  LLE Tone  LLE Tone WFL             Objective measurements completed on examination: See above findings.                   PT Long Term Goals - 10/12/17 2000       PT LONG TERM GOAL #1   Title  Patient demonstrates & verbalizes understanding of ongoing fitness plan /  HEP. (All LTGs Target  Date: 11/10/2017)    Time  4    Period  Weeks    Status  New    Target Date  11/10/17      PT LONG TERM GOAL #2   Title  Patient verbalizes understanding of CVA risk factors & signs/symptoms.    Time  4    Period  Weeks    Status  New    Target Date  11/10/17      PT LONG TERM GOAL #3   Title  Berg Balance >52/56 to indicate lower fall risk    Time  4    Period  Weeks    Status  New    Target Date  11/10/17      PT LONG TERM GOAL #4   Title  Functional Gait Assessment >/=  24/30    Time  4    Period  Weeks    Status  New    Target Date  11/10/17      PT LONG TERM GOAL #5   Title  assess 6-minute Walk Test & set goal    Time  4    Period  Weeks    Status  New    Target Date  11/10/17      Additional Long Term Goals   Additional Long Term Goals  Yes      PT LONG TERM GOAL #6   Title  Patient demonstrates work simulation tasks safely.     Time  4    Period  Weeks    Status  New    Target Date  11/10/17             Plan - 10/12/17 2018    Clinical Impression Statement  This 57yo male has left lower extremity hemiparesis limiting mobility and function including tasks required for return to work like standing long periods, pushing & pulling and moving heavy equipment.  Berg Balance 42 /56 indicates high fall risk. Functional Gait Assessment 14/30 also indicates high fall risk. Scanning & negotiating obstacles result in balance loss with minimal assist to prevent fall. Patient will benefit from skilled PT to improve function & safety.  He has weakness in LUE and dysarthria with speech and would benefit from evaluations.     History and Personal Factors relevant to plan of care:  HTN    Clinical Presentation  Evolving    Clinical Presentation due to:  unstable HTN, acute CVA, high fall risk    Clinical Decision Making  Moderate    Rehab  Potential  Good    Clinical Impairments Affecting Rehab Potential  LLE weakness, high fall risk, significant other has noticed impaired processing verbal instructions    PT Frequency  2x / week    PT Duration  4 weeks    PT Treatment/Interventions  ADLs/Self Care Home Management;Canalith Repostioning;DME Instruction;Gait training;Stair training;Functional mobility training;Therapeutic activities;Therapeutic exercise;Balance training;Neuromuscular re-education;Patient/family education;Vestibular    PT Next Visit Plan  perform 6 minute walk test & set LTG, HEP for strength & balance    Consulted and Agree with Plan of Care  Patient;Family member/caregiver    Family Member Consulted  girlfriend, Thomas Howard       Patient will benefit from skilled therapeutic intervention in order to improve the following deficits and impairments:  Abnormal gait, Decreased activity tolerance, Decreased balance, Decreased endurance, Decreased coordination, Decreased mobility, Decreased strength, Dizziness, Postural dysfunction  Visit Diagnosis: Hemiplegia and hemiparesis following cerebral infarction affecting left non-dominant side (HCC)  Unsteadiness on feet  Other abnormalities of gait and mobility  History of falling  Muscle weakness (generalized)  Abnormal posture     Problem List Patient Active Problem List   Diagnosis Date Noted  . Acute cerebral infarction (HCC) 09/30/2017  . OSA (obstructive sleep apnea) 09/30/2017  . Smoker 09/30/2017  . 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 Health care maintenance 06/20/2013  . Severe obesity (BMI 35.0-39.9) with comorbidity (HCC) 12/15/2011  . Skin rash 07/07/2011  . HTN (hypertension)     Thomas Howard PT, DPT 10/13/2017, 6:33 AM  [facility] [address] [place], [place], [address] Phone: [phone]   Fax:  [phone]  Name: Thomas Howard MRN:  [id number] Date of Birth: [birth year]

## 2017-10-14 ENCOUNTER — Ambulatory Visit: Payer: BLUE CROSS/BLUE SHIELD | Admitting: Family Medicine

## 2017-10-14 ENCOUNTER — Encounter: Payer: Self-pay | Admitting: Family Medicine

## 2017-10-14 VITALS — BP 152/84 | HR 93 | Temp 99.1°F | Ht 68.0 in | Wt 235.2 lb

## 2017-10-14 DIAGNOSIS — G4733 Obstructive sleep apnea (adult) (pediatric): Secondary | ICD-10-CM | POA: Diagnosis not present

## 2017-10-14 DIAGNOSIS — F17200 Nicotine dependence, unspecified, uncomplicated: Secondary | ICD-10-CM | POA: Diagnosis not present

## 2017-10-14 DIAGNOSIS — I639 Cerebral infarction, unspecified: Secondary | ICD-10-CM

## 2017-10-14 DIAGNOSIS — R739 Hyperglycemia, unspecified: Secondary | ICD-10-CM | POA: Diagnosis not present

## 2017-10-14 DIAGNOSIS — I10 Essential (primary) hypertension: Secondary | ICD-10-CM

## 2017-10-14 LAB — BASIC METABOLIC PANEL
BUN: 23 mg/dL (ref 6–23)
CHLORIDE: 98 meq/L (ref 96–112)
CO2: 28 mEq/L (ref 19–32)
CREATININE: 1.04 mg/dL (ref 0.40–1.50)
Calcium: 9.9 mg/dL (ref 8.4–10.5)
GFR: 95.44 mL/min (ref >60.00)
Glucose, Bld: 288 mg/dL — ABNORMAL HIGH (ref 70–99)
POTASSIUM: 3.3 meq/L — AB (ref 3.5–5.1)
SODIUM: 134 meq/L — AB (ref 135–145)

## 2017-10-14 LAB — HEMOGLOBIN A1C: HEMOGLOBIN A1C: 9.9 % — AB (ref 4.6–6.5)

## 2017-10-14 MED ORDER — ATORVASTATIN CALCIUM 80 MG PO TABS: 80.0000 mg | ORAL_TABLET | Freq: Every day | ORAL | 3 refills | Status: DC

## 2017-10-14 MED ORDER — CLOPIDOGREL BISULFATE 75 MG PO TABS: 75.0000 mg | ORAL_TABLET | Freq: Every day | ORAL | 3 refills | Status: DC

## 2017-10-14 MED ORDER — LISINOPRIL 40 MG PO TABS: 40.0000 mg | ORAL_TABLET | Freq: Every day | ORAL | 3 refills | Status: DC

## 2017-10-14 MED ORDER — HYDROCHLOROTHIAZIDE 50 MG PO TABS: 50.0000 mg | ORAL_TABLET | Freq: Every day | ORAL | 3 refills | Status: DC

## 2017-10-14 MED ORDER — AMLODIPINE BESYLATE 2.5 MG PO TABS: 2.5000 mg | ORAL_TABLET | Freq: Every day | ORAL | 3 refills | Status: DC

## 2017-10-14 NOTE — Patient Instructions (Addendum)
Continue current medicines, add amlodipine 2.5mg  daily for blood pressure control. 3 blood pressure medicines total.  Repeat labs today including sugar check.  Keep follow up with occupational and speech therapy - I will await their report.  Return in 1 month for follow up visit.  Check with therapy about estimated return to work as well.

## 2017-10-14 NOTE — Progress Notes (Signed)
BP (!) 152/84 (BP Location: Right Arm, Cuff Size: Large)   Pulse 93   Temp 99.1 F (37.3 C) (Oral)   Ht 5\' 8"  (1.727 m)   Wt 235 lb 4 oz (106.7 kg)   SpO2 94%   BMI 35.77 kg/m    CC: 2 wk f/u HTN Subjective:    Patient ID: Thomas Howard, male    DOB: January 13, 1961, 57 y.o.   MRN: [id number]  HPI: Thomas FairyDarvin Lippard is a 57 y.o. male presenting on 10/14/2017 for Hypertension (Here for 2 wk follow up. Pt accompained by significant other, April. )   See prior note for details.  Recent acute nonhemorrhagic R basal ganglia lacunar infarct. Has been referred to outpatient PT/OT/ST.  Persistent weakness and fatigue. Notes speech affected with prolonged talking and towards end of day. Unsteadiness improving - more noticeable when tired.   Compliant with medications - aspirin, plavix, lipitor, hctz and lisinopril.  Has f/u neurology appts 7/23 (OSA not on CPAP) and 7/24 (stroke).   Home BP log - 130-160/80-100.   Smoking - trouble keeping nicoderm patch on. Smoking has decreased - <1ppd.   Sugar levels at ER were stable, however last labwork cbg 288 without h/o DM.   Relevant past medical, surgical, family and social history reviewed and updated as indicated. Interim medical history since our last visit reviewed. Allergies and medications reviewed and updated. Outpatient Medications Prior to Visit  Medication Sig Dispense Refill  . aspirin 81 MG EC tablet Take 81 mg by mouth daily.  0  . nicotine (NICODERM CQ - DOSED IN MG/24 HOURS) 21 mg/24hr patch Place 21 mg onto the skin daily.    Marland Kitchen. atorvastatin (LIPITOR) 80 MG tablet Take 80 mg by mouth at bedtime.  0  . clopidogrel (PLAVIX) 75 MG tablet Take 1 tablet by mouth daily.    . hydrochlorothiazide (HYDRODIURIL) 50 MG tablet Take 50 mg by mouth daily.  0  . lisinopril (PRINIVIL,ZESTRIL) 40 MG tablet Take 40 mg by mouth daily.  0   No facility-administered medications prior to visit.      Per HPI unless specifically indicated in ROS section  below Review of Systems     Objective:    BP (!) 152/84 (BP Location: Right Arm, Cuff Size: Large)   Pulse 93   Temp 99.1 F (37.3 C) (Oral)   Ht 5\' 8"  (1.727 m)   Wt 235 lb 4 oz (106.7 kg)   SpO2 94%   BMI 35.77 kg/m   Wt Readings from Last 3 Encounters:  10/14/17 235 lb 4 oz (106.7 kg)  09/30/17 236 lb 8 oz (107.3 kg)  06/20/13 246 lb (111.6 kg)    Physical Exam  Constitutional: He appears well-developed and well-nourished. No distress.  HENT:  Mouth/Throat: Oropharynx is clear and moist. No oropharyngeal exudate.  Cardiovascular: Normal rate, regular rhythm and normal heart sounds.  No murmur heard. Pulmonary/Chest: Effort normal and breath sounds normal. No respiratory distress. He has no wheezes. He has no rales.  Musculoskeletal: He exhibits no edema.  Neurological: He is alert. No cranial nerve deficit or sensory deficit. He displays a negative Romberg sign.  CN 2-12 intact 4+/5 strength LUE and LLE 5/5 strength RUE, RLE  Nursing note and vitals reviewed.  Results for orders placed or performed in visit on 10/14/17  Basic metabolic panel  Result Value Ref Range   Sodium 134 (L) 135 - 145 mEq/L   Potassium 3.3 (L) 3.5 - 5.1 mEq/L   Chloride  98 96 - 112 mEq/L   CO2 28 19 - 32 mEq/L   Glucose, Bld 288 (H) 70 - 99 mg/dL   BUN 23 6 - 23 mg/dL   Creatinine, Ser 1.61 0.40 - 1.50 mg/dL   Calcium 9.9 8.4 - 09.6 mg/dL   GFR 04.54 >09.81 mL/min  Hemoglobin A1c  Result Value Ref Range   Hgb A1c MFr Bld 9.9 (H) 4.6 - 6.5 %      Assessment & Plan:   Problem List Items Addressed This Visit    Smoker    Slowly decreasing use, down to <1ppd. Continue to encourage full cessation.  Having trouble keeping nicoderm CQ attached.       Severe obesity (BMI 35.0-39.9) with comorbidity (HCC)   OSA (obstructive sleep apnea)    Has appt to establish with neurology for OSA next month.       Hyperglycemia    cbg last month 270s. Repeat today with A1c. ?new diabetes.        Relevant Orders   Basic metabolic panel (Completed)   Hemoglobin A1c (Completed)   HTN (hypertension) - Primary    Improving control with lisinopril 40mg , hctz 50mg  but persistently elevated. Will add amlodipine 2.5mg  daily. Continues home BP monitoring.       Relevant Medications   atorvastatin (LIPITOR) 80 MG tablet   hydrochlorothiazide (HYDRODIURIL) 50 MG tablet   lisinopril (PRINIVIL,ZESTRIL) 40 MG tablet   amLODipine (NORVASC) 2.5 MG tablet   Acute cerebral infarction (HCC)    Continue working towards BP, lipid control.  We have referred to PT/OT/ST.  Will await their recommendations for return to work. Will write out of work for next 2 weeks.           Meds ordered this encounter  Medications  . atorvastatin (LIPITOR) 80 MG tablet    Sig: Take 1 tablet (80 mg total) by mouth at bedtime.    Dispense:  90 tablet    Refill:  3  . clopidogrel (PLAVIX) 75 MG tablet    Sig: Take 1 tablet (75 mg total) by mouth daily.    Dispense:  90 tablet    Refill:  3  . hydrochlorothiazide (HYDRODIURIL) 50 MG tablet    Sig: Take 1 tablet (50 mg total) by mouth daily.    Dispense:  90 tablet    Refill:  3  . lisinopril (PRINIVIL,ZESTRIL) 40 MG tablet    Sig: Take 1 tablet (40 mg total) by mouth daily.    Dispense:  90 tablet    Refill:  3  . amLODipine (NORVASC) 2.5 MG tablet    Sig: Take 1 tablet (2.5 mg total) by mouth daily.    Dispense:  90 tablet    Refill:  3   Orders Placed This Encounter  Procedures  . Basic metabolic panel  . Hemoglobin A1c    Follow up plan: Return in about 1 month (around 11/11/2017) for follow up visit.  Eustaquio Boyden, MD

## 2017-10-15 ENCOUNTER — Ambulatory Visit: Payer: BLUE CROSS/BLUE SHIELD

## 2017-10-15 ENCOUNTER — Ambulatory Visit: Payer: BLUE CROSS/BLUE SHIELD | Attending: Family Medicine | Admitting: Occupational Therapy

## 2017-10-15 DIAGNOSIS — I69315 Cognitive social or emotional deficit following cerebral infarction: Secondary | ICD-10-CM

## 2017-10-15 DIAGNOSIS — R41842 Visuospatial deficit: Secondary | ICD-10-CM | POA: Insufficient documentation

## 2017-10-15 DIAGNOSIS — M6281 Muscle weakness (generalized): Secondary | ICD-10-CM | POA: Diagnosis not present

## 2017-10-15 DIAGNOSIS — I69354 Hemiplegia and hemiparesis following cerebral infarction affecting left non-dominant side: Secondary | ICD-10-CM | POA: Diagnosis not present

## 2017-10-15 DIAGNOSIS — R471 Dysarthria and anarthria: Secondary | ICD-10-CM

## 2017-10-15 DIAGNOSIS — Z9181 History of falling: Secondary | ICD-10-CM | POA: Diagnosis not present

## 2017-10-15 DIAGNOSIS — R41841 Cognitive communication deficit: Secondary | ICD-10-CM

## 2017-10-15 DIAGNOSIS — R278 Other lack of coordination: Secondary | ICD-10-CM | POA: Diagnosis not present

## 2017-10-15 DIAGNOSIS — R293 Abnormal posture: Secondary | ICD-10-CM | POA: Diagnosis not present

## 2017-10-15 DIAGNOSIS — R2681 Unsteadiness on feet: Secondary | ICD-10-CM | POA: Diagnosis not present

## 2017-10-15 DIAGNOSIS — R2689 Other abnormalities of gait and mobility: Secondary | ICD-10-CM | POA: Diagnosis not present

## 2017-10-15 NOTE — Therapy (Signed)
[facility] [address] [place], [place], [address] Phone: [phone]   Fax:  [phone]  Occupational Therapy Evaluation  Patient Details  Name: Thomas Howard MRN: [id number] Date of Birth: 05/28/1960 Referring Provider: Jerrye Bushy, MD   Encounter Date: 10/15/2017  OT End of Session - 10/15/17 1039    Visit Number  1    Number of Visits  25    Date for OT Re-Evaluation  01/13/18    Authorization Type  BCBS    Authorization - Visit Number  1    Authorization - Number of Visits  30 60 combined for PT/OT, 1 visit if same day    OT Start Time  0940    OT Stop Time  1020    OT Time Calculation (min)  40 min    Behavior During Therapy  [facility] for tasks assessed/performed       Past Medical History:  Diagnosis Date  . History of smoking   . HTN (hypertension)   . Obesity   . Wears partial dentures     Past Surgical History:  Procedure Laterality Date  . MOUTH SURGERY    . WISDOM TOOTH EXTRACTION      There were no vitals filed for this visit.  Subjective Assessment - 10/15/17 1540    Subjective   Pt reports LUE weakness    Pertinent History  hospitalized 09/22/17-09/25/17 at [facility] with acute nonhemorrhagic right basal ganglia lacunar infarct and HTN.    Patient Stated Goals  regain use of LUE    Currently in Pain?  No/denies        [facility] OT Assessment - 10/15/17 0944      Assessment   Medical Diagnosis  Acute cerebral Infarct    Referring Provider  Jerrye Bushy, MD    Onset Date/Surgical Date  09/22/17    Hand Dominance  Right      Precautions   Precautions  Fall    Precaution Comments  no driving      Balance Screen   Has the patient fallen in the past 6 months  Yes    How many times?  1    Has the patient had a decrease in activity level because of a fear of falling?   No    Is the patient reluctant to leave their home because of a fear of falling?   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;  on feet 8-12 hr shifts    Leisure  videogame      ADL   Eating/Feeding  Needs assist with cutting food min A    Grooming  Modified independent    Upper Body Bathing  Supervision/safety has grab bars    Lower Body Bathing  Supervision/safety    Upper Body Dressing  Moderate assistance    Lower Body Dressing  Moderate assistance due to fatigue  Music therapistToilet Transfer  Modified independent    Tub/Shower Transfer  Supervision/safety    ADL comments  Pt fatigues very quickly      IADL   Light Housekeeping  -- Pt did not perform before hand    Meal Prep  -- has grilled but has not used stove    Medication Management  Is responsible for taking medication in correct dosages at correct time    Financial Management  -- Pt does online bill pay      Mobility   Mobility Status  -- supervision to modified independent      Written Expression   Dominant Hand  Right      Vision Assessment   Vision Assessment  Vision impaired  _ to be further tested in functional context    Visual Fields  -- appears intact from gross assessment    Patient has diffculty with activities due to visual impairment  -- 1.47M number cancellation: 60% accuracy, missed items left    Comment  Pt demonstrates left inattention, requiring v.c to attend to left side during eval      Activity Tolerance   Activity Tolerance  -- Poor activity tolerance, fatigues during basic ADLs.       Cognition   Overall Cognitive Status  Impaired/Different from baseline    Area of Impairment  Safety/judgement;Awareness    Attention  Selective    Selective Attention  Impaired    Selective Attention Impairment  -- easily distracted    Memory  --    Awareness  Impaired decreased awareness of cognitive/ visual perceptual deficits    Behaviors  -- delayed processing       Posture/Postural Control   Posture/Postural Control  Postural limitations    Postural Limitations  Rounded Shoulders;Forward head;Increased lumbar lordosis;Flexed trunk;Weight shift right      Sensation   Light Touch  Appears Intact      Coordination   Gross Motor Movements are Fluid and Coordinated  No    Fine Motor Movements are Fluid and Coordinated  No    Coordination and Movement Description  decreased gross and fine motor control in LUE    9 Hole Peg Test  Right;Left    Right 9 Hole Peg Test  26.94 secs    Left 9 Hole Peg Test  44.97 secs      Praxis   Praxis  --      ROM / Strength   AROM / PROM / Strength  AROM      AROM   Overall AROM   Deficits    Overall AROM Comments  LUE shoulder flexion 125*, abduct 110*, grossly 75% supination       Strength   Overall Strength  Deficits    Overall Strength Comments  LUE decreased proximal strength grossly 3+/5 distal 4/5      Hand Function   Right Hand Grip (lbs)  105    Left Hand Grip (lbs)  75                        OT Short Term Goals - 10/15/17 1052      OT SHORT TERM GOAL #1   Title  I with inital HEP. due 11/29/17    Time  6    Period  Weeks    Status  New    Target Date  11/29/17      OT SHORT TERM GOAL #2   Title  Pt will verbalize  understanding of compensatory strategies for visual perceptual  impairment    Time  6    Period  Weeks    Status  New      OT SHORT TERM GOAL #3   Title  Pt will perform all basic ADLS with modified indpendently with no more than 2 rest breaks    Time  6    Period  Weeks    Status  New       OT SHORT TERM GOAL #4   Title  Pt will perform tabletop scanning activities with 80% or better accuracy for improved safety during ADLs.    Baseline  60% on 1.5 M number cancellation, mitems missed primarily L of midline    Time  6    Period  Weeks    Status  New      OT SHORT TERM GOAL #5   Title  Pt will perfrom basic environmental scanning with 80% or better accuracy in prep for driving.    Time  6    Period  Weeks    Status  New      Additional Short Term Goals   Additional Short Term Goals  Yes      OT SHORT TERM GOAL #6   Title  Pt will demonstrate ability to retrieve a lightweight object at 130* shoulder flexion without dropping with LUE     Time  6    Period  Weeks    Status  New      OT SHORT TERM GOAL #7   Title  further assess cognitve skills and set additional goal PRN    Time  6    Period  Weeks    Status  New        OT Long Term Goals - 10/15/17 1057      OT LONG TERM GOAL #1   Title  I with updated HEP.    Time  12    Period  Weeks    Status  New    Target Date  01/13/18      OT LONG TERM GOAL #2   Title  Pt will demonstrate improved fine motor coordination  for ADLs as evidenced by decreasing 9 hole peg test score to 38 secs or less.    Baseline  RUE 26.94 sec, LUE 44.97 secs    Time  12    Period  Weeks    Status  New      OT LONG TERM GOAL #3   Title  Pt will perform simple cooking task modified indpendently demonstrating good safety awareness.    Time  12    Period  Weeks    Status  New      OT LONG TERM GOAL #4   Title  Pt will perfrom tabletop and environmental scanning activities with 90% or better accurary in prep for ADLs and work activities.    Time  12    Period  Weeks    Status  New      OT LONG TERM GOAL #5   Title  Pt will demonstrate adequate LUE strength and control to retrieve a 5 lbs weight from mid level shelf x 5 reps without drops.    Time  12    Period  Weeks    Status  New            Plan - 10/15/17  1445    Clinical Impression Statement  This 57 yo male was referredby  Eustaquio Boyden, MD with acute cerebral infarction. He was hospitalized 09/22/17-09/25/17 at [facility] with acute nonhemorrhagic right basal ganglia lacunar infarct and HTN. Pt presents to occupational therapy with the following deficits which impede pt's ability to perfrom ADLs/IADLS: decreased strength, decreased coordination, decreased balance, left inattention, and cognitive deficits. Pt can benefit from skilled occupational therapy to maximize pt's safety and independence with daily activities.     Occupational Profile and client history currently impacting functional performance  Prior to CVA, pt was completely I, working full time and driving. Pt currently is unable to work, drive and he requires assist with ADLs/IADLs PMH: HTN, smoker    Occupational performance deficits (Please refer to evaluation for details):  ADL's;IADL's;Work;Leisure;Social Participation    Rehab Potential  Good    Current Impairments/barriers affecting progress:  severity of deficits    OT Frequency  2x / week plus eval    OT Duration  12 weeks    OT Treatment/Interventions  Self-care/ADL training;Electrical Stimulation;Therapeutic exercise;Visual/perceptual remediation/compensation;Coping strategies training;Patient/family education;Splinting;Neuromuscular education;Paraffin;Moist Heat;Fluidtherapy;Energy conservation;Therapeutic activities;Cognitive remediation/compensation;Balance training;Passive range of motion;Manual Therapy;DME and/or AE instruction;Contrast Bath;Ultrasound;Cryotherapy    Plan  further assess cognition, consider MOCHA, environmental scanning/ visual perceptual skills    Clinical Decision Making  Limited treatment options, no task modification necessary    Consulted and Agree with Plan of Care  Patient;Family member/caregiver    Family Member Consulted  April, girlfriend       Patient will benefit from skilled therapeutic  intervention in order to improve the following deficits and impairments:  Abnormal gait, Decreased cognition, Decreased knowledge of use of DME, Impaired flexibility, Impaired vision/preception, Decreased mobility, Decreased coordination, Decreased activity tolerance, Decreased endurance, Decreased range of motion, Decreased strength, Impaired UE functional use, Impaired perceived functional ability, Difficulty walking, Decreased safety awareness, Decreased knowledge of precautions, Decreased balance  Visit Diagnosis: Hemiplegia and hemiparesis following cerebral infarction affecting left non-dominant side (HCC)  Muscle weakness (generalized)  Visuospatial deficit  Cognitive social or emotional deficit following cerebral infarction  Other lack of coordination    Problem List Patient Active Problem List   Diagnosis Date Noted  . Acute cerebral infarction (HCC) 09/30/2017  . OSA (obstructive sleep apnea) 09/30/2017  . Smoker 09/30/2017  . 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 Health care maintenance 06/20/2013  . Severe obesity (BMI 35.0-39.9) with comorbidity (HCC) 12/15/2011  . Skin rash 07/07/2011  . HTN (hypertension)     Shamarcus Hoheisel 10/15/2017, 3:42 PM Keene Breath, OTR/L [phone] Phone: [phone] 3:43 PM 10/15/17 [facility] [address] [place], [place], [address] Phone: [phone]   Fax:  [phone]  Name: Thomas Howard MRN: [id number] Date of Birth: Mar 14, 1961

## 2017-10-15 NOTE — Patient Instructions (Signed)
LIP and TONGUE exercises  --- DO ALL EXERCISES TWICE EACH DAY  1. Lip press - Press your lips together firmly (like making a /m/ sound) and hold 5 seconds -repeat 15 times  2. LOUD and LONG Pryor MontesKiss - make a kissing sound as loud as you can, as long as you can - repeat 15 times  3. Tongue sweep - Sweep your tongue in the pockets of your mouth - touch every tooth  -  ten revolutions each way  4. PUH! - 15 times       MAKE THE SOUNDS STRONG      TUH - 15 times      KUH! - 15 times  5. Move a licorice stick or straw from side to side in your mouth - 20 back and forth movements  6. Say "OOOOO", and "EEEEE" 20 times (Kiss and smile)  7. Put air in your cheeks, and push on either side 15 times  *KEEP THE AIR IN and DON'T LET IT ESCAPE!!*    Say each of these 3 times, 2-3 times each day  Call the cat "Buttercup" A calendar of [place] Four floors to cover Yellow oil ointment Fellow lovers of felines Catastrophe in [place] Plump plumbers' plums The church's chimes chimed Telling time 'til eleven Five valve levers Keep the gate closed Go see that guy Fat cows give milk Automatic DataMinnesota Golden Gophers Fat frogs flip freely TXU Corpuck Tommy into bed Get that game to American Standard Companiesreg Thick thistles stick together Cinnamon aluminum linoleum Black bugs blood Lovely lemon linament Red leather, yellow leather  Big grocery buggy    Purple baby carriage [facility] Proper copper coffee pot Ripe purple cabbage Three free throws Owens-Illinoisim Tebow tackled  [employer] Eagles [place] Dave dipped the dessert  D[employer] Navistar International CorporationBlue Devils Buckle that Health Netbracket    The gospel of BJ'sMark Shirts shrink, shells shouldn't [place] 49ers Take the tackle box File the flash message Give me five flapjacks Fundamental relatives Dye the pets purple Talking Malaw[place]urkey time after time Dark chocolate chunks Political landscape of the kingdom Actuarylectrical engineering genius We played yo-yos yesterday

## 2017-10-15 NOTE — Therapy (Signed)
[facility] [address] [place], [place], [address] Phone: [phone]   Fax:  [phone]  Speech Language Pathology Treatment  Patient Details  Name: Thomas FairyDarvin Whiting MRN: [id number] Date of Birth: 1960-08-17 Referring Provider: Eustaquio BoydenGutierrez, Javier, MD   Encounter Date: 10/15/2017  End of Session - 10/15/17 1342    Visit Number  1    Number of Visits  17    Date for SLP Re-Evaluation  12/31/17    SLP Start Time  0804    SLP Stop Time   0845    SLP Time Calculation (min)  41 min    Activity Tolerance  Patient tolerated treatment well       Past Medical History:  Diagnosis Date  . History of smoking   . HTN (hypertension)   . Obesity   . Wears partial dentures     Past Surgical History:  Procedure Laterality Date  . MOUTH SURGERY    . WISDOM TOOTH EXTRACTION      There were no vitals filed for this visit.  Subjective Assessment - 10/15/17 0810    Subjective  Pt denies ST eval when admitted for CVA. "If I start talking for a long time I get alot of saliva built up and I can't talk at all."    Patient is accompained by:  -- SO    Currently in Pain?  No/denies        SLP Evaluation [facility] - 10/15/17 0810      SLP Visit Information   SLP Received On  10/15/17    Referring Provider  Eustaquio BoydenGutierrez, Javier, MD    Onset Date  09-22-17    Medical Diagnosis  CVA      Prior Functional Status   Cognitive/Linguistic Baseline  Within functional limits    Type of Home  Apartment     Lives With  Significant other    Available Support  Friend(s);Family    Vocation  Full time employment      Cognition   Overall Cognitive Status  Within Functional Limits for tasks assessed OT saw evidence of cognitive deficits;pt may need assessment      Auditory Comprehension   Overall Auditory Comprehension  Appears within functional limits for tasks assessed      Verbal Expression   Overall Verbal Expression  Appears within  functional limits for tasks assessed      Oral Motor/Sensory Function   Overall Oral Motor/Sensory Function  Impaired    Labial ROM  Within Functional Limits    Labial Symmetry  Within Functional Limits    Labial Strength  Reduced Left    Labial Coordination  WFL    Lingual ROM  Within Functional Limits    Lingual Symmetry  Abnormal symmetry left    Lingual Strength  Reduced Left    Lingual Coordination  Reduced    Velum  Within Functional Limits      Motor Speech   Overall Motor Speech  Impaired    Phonation  Low vocal intensity mid 60s dB average (WNL=70-72 dB at 30 cm)    Articulation  Impaired    Level of Impairment  Conversation    Intelligibility  Intelligible           SLP Education - 10/15/17 1341    Education Details  HEP, eval results and possible goals    Person(s) Educated  Patient;Caregiver(s)    Methods  Explanation    Comprehension  Verbalized understanding  SLP Short Term Goals - 10/15/17 1345      SLP SHORT TERM GOAL #1   Title  pt will undergo cognitive linguistic testing in teh first 3 sessions if necessary    Time  2    Period  Weeks    Status  New      SLP SHORT TERM GOAL #2   Title  pt will demo HEP for dysarthria (oral strength, and compensations) with occasional min A x4 sessions    Time  4    Period  Weeks    Status  New      SLP SHORT TERM GOAL #3   Title  pt will use dysarthria compensations in 8 minutes simple conversation to allow for 100% overall intelligibliity x2 sessions    Time  4    Period  Weeks    Status  New      SLP SHORT TERM GOAL #4   Title  pt will use dysarthria compensations for 18/20 sentence responses x2 sessions    [place]  4    Period  Weeks    Status  New      SLP SHORT TERM GOAL #5   Title  pt will use WNL loudness in 8 minutes simple conversation x3 sessions    Time  4    Period  Weeks    Status  New       SLP Long Term Goals - 10/15/17 1349      SLP LONG TERM GOAL #1   Title  pt will demo  dysarthria HEP withrare min A x3 visits    Time  8    Period  Weeks or 17 sessions, for all LTGs    Status  New      SLP LONG TERM GOAL #2   Title  pt will demo 100% overall intelligibliity in 10 minutes simple to mod complex conversation with modified inedpendence x3 sessions    Time  8    Period  Weeks    Status  New      SLP LONG TERM GOAL #3   Title  pt will use average 70dB volume in 10 minutes simple to mod complex converastion x2 sessions    Time  8    Period  Weeks    Status  New       Plan - 10/15/17 1342    Clinical Impression Statement  Pt presents today to SLP with mild dysarthria, reportedly more apparent later in the day. OT also saw evidence of cognitive deficits-pt may require cognitive linguistic assessment in the near future. Pt and significant other (SO) were shown dysarthria exercises today and pt return demonstrated each one to SLP correctly prior to continuing to educate on the next exericse. Pt will benefit from skilled ST focusing on speech clarity, adn possibly cognitive linguistics PRN, to maximize communicative effectiveness.     Speech Therapy Frequency  2x / week    Duration  -- 8 weeks or 17 visits    Treatment/Interventions  Environmental controls;Cueing hierarchy;SLP instruction and feedback;Oral motor exercises;Compensatory strategies;Functional tasks;Cognitive reorganization;Multimodal communcation approach;Internal/external aids;Patient/family education    Potential to Achieve Goals  Good    SLP Home Exercise Plan  provided today    Consulted and Agree with Plan of Care  Patient       Patient will benefit from skilled therapeutic intervention in order to improve the following deficits and impairments:   Dysarthria and anarthria  Cognitive communication deficit  Problem List Patient Active Problem List   Diagnosis Date Noted  . Acute cerebral infarction (HCC) 09/30/2017  . OSA (obstructive sleep apnea) 09/30/2017  . Smoker 09/30/2017  .  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 Health care maintenance 06/20/2013  . Severe obesity (BMI 35.0-39.9) with comorbidity (HCC) 12/15/2011  . Skin rash 07/07/2011  . HTN (hypertension)     Acea Yagi.,MS, CCC-SLP  10/15/2017, 1:53 PM  [facility] [address] [place], [place], [address] Phone: [phone]   Fax:  [phone]   Name: Alexandru Moorer MRN: [id number] Date of Birth: [birth year]

## 2017-10-17 ENCOUNTER — Encounter: Payer: Self-pay | Admitting: Family Medicine

## 2017-10-17 ENCOUNTER — Other Ambulatory Visit: Payer: Self-pay | Admitting: Family Medicine

## 2017-10-17 DIAGNOSIS — IMO0002 Reserved for concepts with insufficient information to code with codable children: Secondary | ICD-10-CM | POA: Insufficient documentation

## 2017-10-17 DIAGNOSIS — E1165 Type 2 diabetes mellitus with hyperglycemia: Secondary | ICD-10-CM | POA: Insufficient documentation

## 2017-10-17 DIAGNOSIS — E1149 Type 2 diabetes mellitus with other diabetic neurological complication: Secondary | ICD-10-CM | POA: Insufficient documentation

## 2017-10-17 NOTE — Assessment & Plan Note (Signed)
Continue working towards BP, lipid control.  We have referred to PT/OT/ST.  Will await their recommendations for return to work. Will write out of work for next 2 weeks.

## 2017-10-17 NOTE — Assessment & Plan Note (Signed)
Improving control with lisinopril 40mg , hctz 50mg  but persistently elevated. Will add amlodipine 2.5mg  daily. Continues home BP monitoring.

## 2017-10-17 NOTE — Assessment & Plan Note (Signed)
Slowly decreasing use, down to <1ppd. Continue to encourage full cessation.  Having trouble keeping nicoderm CQ attached.

## 2017-10-17 NOTE — Assessment & Plan Note (Signed)
Has appt to establish with neurology for OSA next month.

## 2017-10-17 NOTE — Assessment & Plan Note (Addendum)
cbg last month 270s. Repeat today with A1c. ?new diabetes.

## 2017-10-18 ENCOUNTER — Ambulatory Visit: Payer: BLUE CROSS/BLUE SHIELD | Admitting: Physical Therapy

## 2017-10-18 ENCOUNTER — Encounter: Payer: Self-pay | Admitting: Physical Therapy

## 2017-10-18 VITALS — BP 145/95 | HR 91

## 2017-10-18 DIAGNOSIS — I69354 Hemiplegia and hemiparesis following cerebral infarction affecting left non-dominant side: Secondary | ICD-10-CM

## 2017-10-18 DIAGNOSIS — R41842 Visuospatial deficit: Secondary | ICD-10-CM | POA: Diagnosis not present

## 2017-10-18 DIAGNOSIS — I69315 Cognitive social or emotional deficit following cerebral infarction: Secondary | ICD-10-CM | POA: Diagnosis not present

## 2017-10-18 DIAGNOSIS — R2681 Unsteadiness on feet: Secondary | ICD-10-CM | POA: Diagnosis not present

## 2017-10-18 DIAGNOSIS — R293 Abnormal posture: Secondary | ICD-10-CM

## 2017-10-18 DIAGNOSIS — Z9181 History of falling: Secondary | ICD-10-CM | POA: Diagnosis not present

## 2017-10-18 DIAGNOSIS — R2689 Other abnormalities of gait and mobility: Secondary | ICD-10-CM | POA: Diagnosis not present

## 2017-10-18 DIAGNOSIS — R41841 Cognitive communication deficit: Secondary | ICD-10-CM | POA: Diagnosis not present

## 2017-10-18 DIAGNOSIS — M6281 Muscle weakness (generalized): Secondary | ICD-10-CM

## 2017-10-18 DIAGNOSIS — R278 Other lack of coordination: Secondary | ICD-10-CM | POA: Diagnosis not present

## 2017-10-18 DIAGNOSIS — R471 Dysarthria and anarthria: Secondary | ICD-10-CM | POA: Diagnosis not present

## 2017-10-18 NOTE — Therapy (Signed)
[facility] [address] [place], [place], [address] Phone: [phone]   Fax:  [phone]  Physical Therapy Treatment  Patient Details  Name: Thomas Howard MRN: [id number] Date of Birth: 03/31/1961 Referring Provider: Jerrye Bushy, MD   Encounter Date: 10/18/2017  PT End of Session - 10/18/17 0933    Visit Number  2    Number of Visits  9    Authorization Type  [employer]    PT Start Time  0845    PT Stop Time  0930    PT Time Calculation (min)  45 min    Activity Tolerance  Patient tolerated treatment well       Past Medical History:  Diagnosis Date  . History of smoking   . HTN (hypertension)   . Obesity   . Wears partial dentures     Past Surgical History:  Procedure Laterality Date  . MOUTH SURGERY    . WISDOM TOOTH EXTRACTION      Vitals:   10/18/17 0851  BP: (!) 145/95  Pulse: 91  SpO2: 97%    Subjective Assessment - 10/18/17 0849    Subjective  Balance is still wobbly. Pt walked dog with April on Saturday for about 15- .    Currently in Pain?  No/denies         [facility] PT Assessment - 10/18/17 0001      6 Minute Walk- Baseline   6 Minute Walk- Baseline  yes    BP (mmHg)  145/95  (Abnormal)     HR (bpm)  91    02 Sat (%RA)  96 %      6 Minute walk- Post Test   6 Minute Walk Post Test  yes    BP (mmHg)  121/85    HR (bpm)  102    02 Sat (%RA)  94 %    Modified Borg Scale for Dyspnea  0.5- Very, very slight shortness of breath    Perceived Rate of Exertion (Borg)  6-      6 minute walk test results    Aerobic Endurance Distance Walked  1293    Endurance additional comments  no seated rest needed; no AD                   [facility] Adult PT Treatment/Exercise - 10/18/17 0001      Ambulation/Gait   Ambulation/Gait  Yes    Ambulation/Gait Assistance  5: Supervision    Ambulation/Gait Assistance Details  6 min walk test    Ambulation Distance (Feet)  1293 Feet    Assistive device  None    Gait Pattern  Step-through pattern;Decreased arm swing - left;Decreased step length - right;Decreased stance time - left;Decreased hip/knee flexion - left;Decreased weight shift to left;Left hip hike;Antalgic;Lateral hip instability;Trunk flexed;Wide base of support    Ambulation Surface  Level;Indoor          Balance Exercises - 10/18/17 1313      Balance Exercises: Standing   Tandem Stance  Eyes open;Intermittent upper extremity support;3 reps;30 secs progressed to tandem gait without UE support    SLS  Eyes open;Intermittent upper extremity support;3 reps;15 secs    Marching Limitations  high march with 3sec holds progressed from intermittent to no UE support.        PT Education - 10/18/17 1311    Education Details  HEP for standing balance; encouraged pt to continue to walk dog with April for supervison.  Person(s) Educated  Patient;Spouse    Methods  Explanation;Verbal cues;Demonstration;Handout    Comprehension  Verbalized understanding;Returned demonstration;Verbal cues required;Need further instruction          PT Long Term Goals - 10/12/17 2000      PT LONG TERM GOAL #1   Title  Patient demonstrates & verbalizes understanding of ongoing fitness plan /  HEP. (All LTGs Target  Date: 11/10/2017)    Time  4    Period  Weeks    Status  New    Target Date  11/10/17      PT LONG TERM GOAL #2   Title  Patient verbalizes understanding of CVA risk factors & signs/symptoms.    Time  4    Period  Weeks    Status  New    Target Date  11/10/17      PT LONG TERM GOAL #3   Title  Berg Balance >52/56 to indicate lower fall risk    Time  4    Period  Weeks    Status  New    Target Date  11/10/17      PT LONG TERM GOAL #4   Title  Functional Gait Assessment >/= 24/30    Time  4    Period  Weeks    Status  New    Target Date  11/10/17      PT LONG TERM GOAL #5   Title  assess 6-minute Walk Test & set goal    Time  4    Period  Weeks     Status  New    Target Date  11/10/17      Additional Long Term Goals   Additional Long Term Goals  Yes      PT LONG TERM GOAL #6   Title  Patient demonstrates work simulation tasks safely.     Time  4    Period  Weeks    Status  New    Target Date  11/10/17            Plan - 10/18/17 1305    Clinical Impression Statement  Initiated HEP for standing balance; pt required intermittent, light UE support on level surface.  Pt walked 1293 ft during 6 min walk; no AD.    Rehab Potential  Good    Clinical Impairments Affecting Rehab Potential  LLE weakness, high fall risk, significant other has noticed impaired processing verbal instructions    PT Frequency  2x / week    PT Duration  4 weeks    PT Treatment/Interventions  ADLs/Self Care Home Management;Canalith Repostioning;DME Instruction;Gait training;Stair training;Functional mobility training;Therapeutic activities;Therapeutic exercise;Balance training;Neuromuscular re-education;Patient/family education;Vestibular    PT Next Visit Plan  dynamic standing balance on compliant surface, L LE strengthening    Consulted and Agree with Plan of Care  Patient;Family member/caregiver    Family Member Consulted  girlfriend, April Blackwell       Patient will benefit from skilled therapeutic intervention in order to improve the following deficits and impairments:  Abnormal gait, Decreased activity tolerance, Decreased balance, Decreased endurance, Decreased coordination, Decreased mobility, Decreased strength, Dizziness, Postural dysfunction  Visit Diagnosis: Hemiplegia and hemiparesis following cerebral infarction affecting left non-dominant side (HCC)  Unsteadiness on feet  Muscle weakness (generalized)  History of falling  Abnormal posture     Problem List Patient Active Problem List   Diagnosis Date Noted  . Hyperglycemia 10/17/2017  . Acute cerebral infarction (HCC) 09/30/2017  . OSA (obstructive sleep  apnea) 09/30/2017  .  Smoker 09/30/2017  . 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 Health care maintenance 06/20/2013  . Severe obesity (BMI 35.0-39.9) with comorbidity (HCC) 12/15/2011  . Skin rash 07/07/2011  . HTN (hypertension)     Hortencia Conradi, PTA  10/18/17, 1:15 PM [facility] [address] [place], [place], [address] Phone: [phone]   Fax:  [phone]  Name: Nickoli Bagheri MRN: [id number] Date of Birth: 1961-05-02

## 2017-10-18 NOTE — Patient Instructions (Addendum)
Tandem Stance    Right foot in front of left, heel touching toe both feet "straight ahead". Stand on Foot Triangle of Support with both feet. Balance in this position _30__ seconds. x2-3 Do with left foot in front of right.  Copyright  VHI. All rights reserved.  Tandem Walking    Walk with each foot directly in front of other, heel of one foot touching toes of other foot with each step. Both feet straight ahead. Progress walking on gravel or grass; backwards, then with head turns  Copyright  VHI. All rights reserved.  Single Leg - Eyes Open    Holding support, lift right leg while maintaining balance over other leg. Progress to removing hands from support surface for longer periods of time. Hold__15__ seconds. Repeat __3__ times per session. Do _1-2___ sessions per day.  Copyright  VHI. All rights reserved.  Marching forward    Standing straight, alternate bringing knees toward trunk. Arms swing alternately. Do _4_ sets. Do _1-2__ times per day.  Copyright  VHI. All rights reserved.

## 2017-10-19 ENCOUNTER — Ambulatory Visit: Payer: BLUE CROSS/BLUE SHIELD | Admitting: Speech Pathology

## 2017-10-19 ENCOUNTER — Encounter: Payer: Self-pay | Admitting: Speech Pathology

## 2017-10-19 DIAGNOSIS — R2689 Other abnormalities of gait and mobility: Secondary | ICD-10-CM | POA: Diagnosis not present

## 2017-10-19 DIAGNOSIS — R471 Dysarthria and anarthria: Secondary | ICD-10-CM

## 2017-10-19 DIAGNOSIS — R41842 Visuospatial deficit: Secondary | ICD-10-CM | POA: Diagnosis not present

## 2017-10-19 DIAGNOSIS — R41841 Cognitive communication deficit: Secondary | ICD-10-CM

## 2017-10-19 DIAGNOSIS — Z9181 History of falling: Secondary | ICD-10-CM | POA: Diagnosis not present

## 2017-10-19 DIAGNOSIS — I69315 Cognitive social or emotional deficit following cerebral infarction: Secondary | ICD-10-CM | POA: Diagnosis not present

## 2017-10-19 DIAGNOSIS — M6281 Muscle weakness (generalized): Secondary | ICD-10-CM | POA: Diagnosis not present

## 2017-10-19 DIAGNOSIS — R2681 Unsteadiness on feet: Secondary | ICD-10-CM | POA: Diagnosis not present

## 2017-10-19 DIAGNOSIS — R293 Abnormal posture: Secondary | ICD-10-CM | POA: Diagnosis not present

## 2017-10-19 DIAGNOSIS — R278 Other lack of coordination: Secondary | ICD-10-CM | POA: Diagnosis not present

## 2017-10-19 DIAGNOSIS — I69354 Hemiplegia and hemiparesis following cerebral infarction affecting left non-dominant side: Secondary | ICD-10-CM | POA: Diagnosis not present

## 2017-10-19 NOTE — Patient Instructions (Signed)
   Cognitive Activities you can do at home:   - Solitaire  - Majong  - Scrabble  - Chess/Checkers  - Crosswords (easy level)  - Education officer, communityUno  - Card Games  - Board Games  - Connect 4  - Simon  - the Memory Game  - Dominoes  - Backgammon  - Jig saw  - Spot the difference  On your computer, tablet or phone: Air traffic controllerBrainHQ Memory Match Game App [employer] [place] [employer] Spot the difference games   Tips to help facilitate better attention, concentration, focus   Do harder, longer tasks when you are most alert/awake  Break down larger tasks into small parts  Limit distractions of TV, radio, conversation, e mails/texts, appliance noise, etc - if a job is important, do it in a quiet room  Be aware of how you are functioning in high stimulation environments such as large stores, parties, restaurants - any place with lots of lights, noise, signs etc  Group conversations may be more difficult to process than one on one conversations  Give yourself extra time to process conversation, reading materials, directions or information from your healthcare providers  Organization is key - clutters of laundry, mail, paperwork, dirty dishes - all make it more difficult to concentrate  Before you start a task, have all the needed supplies, directions, recipes ready and organized. This way you don't have to go looking for something in the middle of a task and become distracted.   Be aware of fatigue - take rests or breaks when needed to re-group and re-focus  Continue tongue twisters and face exercises - SLOW and BIG - you should feel funny - make every sound

## 2017-10-19 NOTE — Therapy (Signed)
[facility] [address] [place], [place], [address] Phone: [phone]   Fax:  [phone]  Speech Language Pathology Treatment  Patient Details  Name: Thomas Howard MRN: [id number] Date of Birth: November 24, 1960 Referring Provider: Eustaquio BoydenGutierrez, Javier, MD   Encounter Date: 10/19/2017  End of Session - 10/19/17 1230    Visit Number  2    Number of Visits  17    Date for SLP Re-Evaluation  12/31/17    SLP Start Time  0932    SLP Stop Time   1014    SLP Time Calculation (min)  42 min    Activity Tolerance  Patient tolerated treatment well       Past Medical History:  Diagnosis Date  . History of smoking   . HTN (hypertension)   . Obesity   . Wears partial dentures     Past Surgical History:  Procedure Laterality Date  . MOUTH SURGERY    . WISDOM TOOTH EXTRACTION      There were no vitals filed for this visit.  Subjective Assessment - 10/19/17 0856    Subjective  "My sister isn't asking "what what" over the phone"    Currently in Pain?  No/denies            ADULT SLP TREATMENT - 10/19/17 0857      General Information   Behavior/Cognition  Alert;Cooperative;Pleasant mood      Treatment Provided   Treatment provided  Cognitive-Linquistic      Cognitive-Linquistic Treatment   Treatment focused on  Dysarthria;Cognition    Skilled Treatment  Pt reported inconsistent follow up with HEP for dysarthria - I re-educated pt re: rationale for HEP. Pt reporting he is being asked to repeat himself less. Pt performed HEP with occasional to usual modeling, verbal cues for carryover of slow, loud, over articulation. Pt agreed to complete HEP more consistently. Pt affirmed difficulty with "focus" and attention. I administered the Cognitive Linguistic Quick Test (CLQT) for futher cognitive assessment. Pt reported mental fatigue after maze section.  Results: Attention - moderate impairment (78); memory - mild impairment  (152); Executive function - mild impairment (20); Language WNL; visuospatial skills - mild impairment (56); clock drawing - WNL.       Assessment / Recommendations / Plan   Plan  Continue with current plan of care      Progression Toward Goals   Progression toward goals  Progressing toward goals       SLP Education - 10/19/17 1223    Person(s) Educated  Patient    Methods  Explanation;Handout    Comprehension  Verbalized understanding;Need further instruction       SLP Short Term Goals - 10/19/17 1225      SLP SHORT TERM GOAL #1   Title  pt will undergo cognitive linguistic testing in teh first 3 sessions if necessary    [place]  2    Period  Weeks    Status  Achieved      SLP SHORT TERM GOAL #2   Title  pt will demo HEP for dysarthria (oral strength, and compensations) with occasional min A x4 sessions    Time  4    Period  Weeks    Status  On-going      SLP SHORT TERM GOAL #3   Title  pt will use dysarthria compensations in 8 minutes simple conversation to allow for 100% overall intelligibliity x2 sessions    Time  4  Period  Weeks    Status  On-going      SLP SHORT TERM GOAL #4   Title  pt will use dysarthria compensations for 18/20 sentence responses x2 sessions    Time  4    Period  Weeks    Status  On-going      SLP SHORT TERM GOAL #5   Title  pt will use WNL loudness in 8 minutes simple conversation x3 sessions    Time  4    Period  Weeks    Status  On-going      Additional Short Term Goals   Additional Short Term Goals  Yes      SLP SHORT TERM GOAL #6   Title  Pt will alternate attention between 2 simple cognitive lingusitic tasks with 90% on each    Time  4    Period  Weeks    Status  New       SLP Long Term Goals - 10/19/17 1227      SLP LONG TERM GOAL #1   Title  pt will demo dysarthria HEP withrare min A x3 visits    Time  8    Period  Weeks or 17 sessions, for all LTGs    Status  On-going      SLP LONG TERM GOAL #2   Title  pt will demo  100% overall intelligibliity in 10 minutes simple to mod complex conversation with modified inedpendence x3 sessions    Time  8    Period  Weeks    Status  On-going      SLP LONG TERM GOAL #3   Title  pt will use average 70dB volume in 10 minutes simple to mod complex converastion x2 sessions    Time  8    Period  Weeks    Status  On-going      SLP LONG TERM GOAL #4   Title  Pt will altenrate attention between 2 moderately complex cognitive linguistic tasks with occasional min A and 85% on each    Time  8    Period  Weeks    Status  New      SLP LONG TERM GOAL #5   Title  Pt will correct errors 3/5 on cognitive linguistic tasks with rare min A over 2 sessions    Time  8    Period  Weeks    Status  New       Plan - 10/19/17 1223    Clinical Impression Statement  Pt required min to mod A for HEP for dysarthira. Adminstered the CLQT - see skilled instructions. Pt with mild to moderate cognitive linguistic impairments in memory, attention, executive function, visuospatial skills. cognitive goals added. Continue skilled ST to maximize intellgilbity and cognition for indepedence and safety.     Treatment/Interventions  Environmental controls;Cueing hierarchy;SLP instruction and feedback;Oral motor exercises;Compensatory strategies;Functional tasks;Cognitive reorganization;Multimodal communcation approach;Internal/external aids;Patient/family education    Potential to Achieve Goals  Good    Potential Considerations  Cooperation/participation level       Patient will benefit from skilled therapeutic intervention in order to improve the following deficits and impairments:   Dysarthria and anarthria  Cognitive communication deficit    Problem List Patient Active Problem List   Diagnosis Date Noted  . Hyperglycemia 10/17/2017  . Acute cerebral infarction (HCC) 09/30/2017  . OSA (obstructive sleep apnea) 09/30/2017  . Smoker 09/30/2017  . Occlusion and stenosis of left anterior  cerebral artery 09/30/2017  .  Health care maintenance 06/20/2013  . Severe obesity (BMI 35.0-39.9) with comorbidity (HCC) 12/15/2011  . Skin rash 07/07/2011  . HTN (hypertension)     Loistine Eberlin, Radene Journey MS, CCC-SLP 10/19/2017, 12:31 PM  [facility] [address] [place], [place], [address] Phone: [phone]   Fax:  [phone]   Name: Thomas Howard MRN: [id number] Date of Birth: [birth year]

## 2017-10-20 ENCOUNTER — Ambulatory Visit: Payer: BLUE CROSS/BLUE SHIELD | Admitting: Occupational Therapy

## 2017-10-20 ENCOUNTER — Encounter: Payer: Self-pay | Admitting: Physical Therapy

## 2017-10-20 ENCOUNTER — Encounter: Payer: Self-pay | Admitting: Occupational Therapy

## 2017-10-20 ENCOUNTER — Ambulatory Visit: Payer: BLUE CROSS/BLUE SHIELD | Admitting: Physical Therapy

## 2017-10-20 DIAGNOSIS — I69315 Cognitive social or emotional deficit following cerebral infarction: Secondary | ICD-10-CM

## 2017-10-20 DIAGNOSIS — R278 Other lack of coordination: Secondary | ICD-10-CM | POA: Diagnosis not present

## 2017-10-20 DIAGNOSIS — R2681 Unsteadiness on feet: Secondary | ICD-10-CM

## 2017-10-20 DIAGNOSIS — R41842 Visuospatial deficit: Secondary | ICD-10-CM | POA: Diagnosis not present

## 2017-10-20 DIAGNOSIS — R293 Abnormal posture: Secondary | ICD-10-CM

## 2017-10-20 DIAGNOSIS — M6281 Muscle weakness (generalized): Secondary | ICD-10-CM | POA: Diagnosis not present

## 2017-10-20 DIAGNOSIS — R471 Dysarthria and anarthria: Secondary | ICD-10-CM | POA: Diagnosis not present

## 2017-10-20 DIAGNOSIS — Z9181 History of falling: Secondary | ICD-10-CM | POA: Diagnosis not present

## 2017-10-20 DIAGNOSIS — I69354 Hemiplegia and hemiparesis following cerebral infarction affecting left non-dominant side: Secondary | ICD-10-CM | POA: Diagnosis not present

## 2017-10-20 DIAGNOSIS — R41841 Cognitive communication deficit: Secondary | ICD-10-CM | POA: Diagnosis not present

## 2017-10-20 DIAGNOSIS — R2689 Other abnormalities of gait and mobility: Secondary | ICD-10-CM | POA: Diagnosis not present

## 2017-10-20 NOTE — Therapy (Signed)
[facility] [address] [place], [place], [address] Phone: [phone]   Fax:  [phone]  Physical Therapy Treatment  Patient Details  Name: Thomas Howard MRN: [id number] Date of Birth: 15-Jul-1960 Referring Provider: Jerrye Bushy, MD   Encounter Date: 10/20/2017  PT End of Session - 10/20/17 0931    Visit Number  3    Authorization Type  BCBS    PT Start Time  0847    PT Stop Time  0925    PT Time Calculation (min)  38 min    Activity Tolerance  Patient tolerated treatment well    Behavior During Therapy  [facility] for tasks assessed/performed       Past Medical History:  Diagnosis Date  . History of smoking   . HTN (hypertension)   . Obesity   . Wears partial dentures     Past Surgical History:  Procedure Laterality Date  . MOUTH SURGERY    . WISDOM TOOTH EXTRACTION      There were no vitals filed for this visit.  Subjective Assessment - 10/20/17 0848    Subjective  Pt walked dog alone about 30 min with no balance issues and good activity tolerance.  Practiced HEP with no issues.   Currently in Pain?  No/denies                            Balance Exercises - 10/20/17 0853      Balance Exercises: Standing   Tandem Stance  Eyes open;30 secs;Intermittent upper extremity support;Foam/compliant surface progress with head turns and tandem walk on compliant surfac    SLS  Eyes open;Foam/compliant surface;Intermittent upper extremity support    SLS with Vectors  Solid surface;Time 60 rolling ball under foot    Stepping Strategy  Anterior;Posterior;Lateral;Foam/compliant surface intermittent UE support; progressed with head nod/turns, cues for body mechanics with weigh shifts.        PT Education - 10/20/17 0927    Education Details  How to progress HEP with standing on compliant surface or with head turns/nods.    Person(s) Educated  Patient    Methods   Explanation;Demonstration;Verbal cues    Comprehension  Verbalized understanding;Returned demonstration;Need further instruction          PT Long Term Goals - 10/12/17 2000      PT LONG TERM GOAL #1   Title  Patient demonstrates & verbalizes understanding of ongoing fitness plan /  HEP. (All LTGs Target  Date: 11/10/2017)    Time  4    Period  Weeks    Status  New    Target Date  11/10/17      PT LONG TERM GOAL #2   Title  Patient verbalizes understanding of CVA risk factors & signs/symptoms.    Time  4    Period  Weeks    Status  New    Target Date  11/10/17      PT LONG TERM GOAL #3   Title  Berg Balance >52/56 to indicate lower fall risk    Time  4    Period  Weeks    Status  New    Target Date  11/10/17      PT LONG TERM GOAL #4   Title  Functional Gait Assessment >/= 24/30    Time  4    Period  Weeks    Status  New    Target Date  11/10/17  PT LONG TERM GOAL #5   Title  assess 6-minute Walk Test & set goal    Time  4    Period  Weeks    Status  New    Target Date  11/10/17      Additional Long Term Goals   Additional Long Term Goals  Yes      PT LONG TERM GOAL #6   Title  Patient demonstrates work simulation tasks safely.     Time  4    Period  Weeks    Status  New    Target Date  11/10/17            Plan - 10/20/17 [id number]    Clinical Impression Statement  Pt demonstrates understanding of HEP. Training to progress SLS and tandem activities and stepping strategies with weight shifts with compliant surface and head turns; pt performed with intermittent UE support and at supervision level.    Rehab Potential  Good    Clinical Impairments Affecting Rehab Potential  LLE weakness, high fall risk, significant other has noticed impaired processing verbal instructions    PT Frequency  2x / week    PT Duration  4 weeks    PT Treatment/Interventions  ADLs/Self Care Home Management;Canalith Repostioning;DME Instruction;Gait training;Stair  training;Functional mobility training;Therapeutic activities;Therapeutic exercise;Balance training;Neuromuscular re-education;Patient/family education;Vestibular    PT Next Visit Plan  dynamic standing balance on compliant surface, L LE strengthening; resistence gait    Consulted and Agree with Plan of Care  Patient;Family member/caregiver    Family Member Consulted  girlfriend, April Blackwell       Patient will benefit from skilled therapeutic intervention in order to improve the following deficits and impairments:  Abnormal gait, Decreased activity tolerance, Decreased balance, Decreased endurance, Decreased coordination, Decreased mobility, Decreased strength, Dizziness, Postural dysfunction  Visit Diagnosis: Hemiplegia and hemiparesis following cerebral infarction affecting left non-dominant side (HCC)  Unsteadiness on feet  Muscle weakness (generalized)  Abnormal posture     Problem List Patient Active Problem List   Diagnosis Date Noted  . Hyperglycemia 10/17/2017  . Acute cerebral infarction (HCC) 09/30/2017  . OSA (obstructive sleep apnea) 09/30/2017  . Smoker 09/30/2017  . 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 Health care maintenance 06/20/2013  . Severe obesity (BMI 35.0-39.9) with comorbidity (HCC) 12/15/2011  . Skin rash 07/07/2011  . HTN (hypertension)     Hortencia ConradiKarissa Todrick Siedschlag, PTA  10/20/17, 10:54 AM [facility] [address] [place], [place], [address] Phone: [phone]   Fax:  [phone]  Name: Antionette FairyDarvin Gaal MRN: [id number] Date of Birth: 1960/09/15

## 2017-10-20 NOTE — Patient Instructions (Addendum)
  Coordination Activities  Perform the following activities for 15-20 minutes 1-2 times per day with left hand(s).   Toss ball between hands.  Toss ball in air and catch with the same hand.  Rotate the ball in your fingertips - go slow when you do this.    Flip cards 1 at a time as fast as you can.  Pick up coins and place in container or coin bank.  Screw together nuts and bolts, then unfasten.

## 2017-10-20 NOTE — Therapy (Signed)
[facility] [address] [place], [place], [address] Phone: [phone]   Fax:  [phone]  Occupational Therapy Treatment  Patient Details  Name: Thomas Howard MRN: [id number] Date of Birth: 1961/03/25 Referring Provider: Jerrye Bushy, MD   Encounter Date: 10/20/2017  OT End of Session - 10/20/17 1334    Visit Number  2    Number of Visits  25    Date for OT Re-Evaluation  01/13/18    Authorization Type  BCBS - 60 visits combined for OT/PT, if occurs same day counts as one visit    Authorization - Visit Number  2    Authorization - Number of Visits  30    OT Start Time  1016    OT Stop Time  1100    OT Time Calculation (min)  44 min    Activity Tolerance  Patient tolerated treatment well       Past Medical History:  Diagnosis Date  . History of smoking   . HTN (hypertension)   . Obesity   . Wears partial dentures     Past Surgical History:  Procedure Laterality Date  . MOUTH SURGERY    . WISDOM TOOTH EXTRACTION      There were no vitals filed for this visit.  Subjective Assessment - 10/20/17 1019    Subjective   My concentration is shot - I can only pay attend for like 5 minutes.    Pertinent History  hospitalized 09/22/17-09/25/17 at [facility] with acute nonhemorrhagic right basal ganglia lacunar infarct and HTN.    Patient Stated Goals  regain use of LUE    Currently in Pain?  No/denies                   OT Treatments/Exercises (OP) - 10/20/17 0001      ADLs   ADL Comments  Reviewed goals and POC - pt in agreement. Provided copy of written goals and folder for pt to keep handouts organized.       Cognitive Exercises   Other Cognitive Exercises 1  Further assessed cognition via MOCA - pt with score of 19/30.  Pt demostrated deficits in visual spatial skills, working memory, Event organiser and advanced concentration, recall related to memory (pt able to form and store a  memory).  Pt did not know day day or date however wife is keeping track of all appts and pt states "I am not working so I just do what my wife tells me to ."  Do not feel pt is truly disoriented.  Reviewed results with pt and pt verbalized understanding. Pt also aware of concentration and memory deficts as well as L neglect.       Neurological Re-education Exercises   Other Exercises 1  Instructed pt in HEP for coordination - pt able to return demonstate all activities after instruction and practice.            Balance Exercises - 10/20/17 0853      Balance Exercises: Standing   Tandem Stance  Eyes open;30 secs;Intermittent upper extremity support;Foam/compliant surface progress with head turns and tandem walk on compliant surfac    SLS  Eyes open;Foam/compliant surface;Intermittent upper extremity support    SLS with Vectors  Solid surface;Time 60 rolling ball under foot    Stepping Strategy  Anterior;Posterior;Lateral;Foam/compliant surface intermittent UE support; progressed with head nod/turns, wei        OT Education - 10/20/17 1332    Education  Details  goals, POC, result of MOCA, coordination HEP    Person(s) Educated  Patient    Methods  Explanation;Demonstration;Handout    Comprehension  Verbalized understanding;Returned demonstration       OT Short Term Goals - 10/20/17 1332      OT SHORT TERM GOAL #1   Title  I with inital HEP. due 11/29/17    Time  6    Period  Weeks    Status  On-going      OT SHORT TERM GOAL #2   Title  Pt will verbalize understanding of compensatory strategies for visual perceptual  impairment    Time  6    Period  Weeks    Status  On-going      OT SHORT TERM GOAL #3   Title  Pt will perform all basic ADLS with modified indpendently with no more than 2 rest breaks    Time  6    Period  Weeks    Status  On-going      OT SHORT TERM GOAL #4   Title  Pt will perform tabletop scanning activities with 80% or better accuracy for improved  safety during ADLs.    Baseline  60% on 1.5 M number cancellation, mitems missed primarily L of midline    Time  6    Period  Weeks    Status  On-going      OT SHORT TERM GOAL #5   Title  Pt will perfrom basic environmental scanning with 80% or better accuracy in prep for driving.    Time  6    Period  Weeks    Status  On-going      OT SHORT TERM GOAL #6   Title  Pt will demonstrate ability to retrieve a lightweight object at 130* shoulder flexion without dropping with LUE     Time  6    Period  Weeks    Status  New      OT SHORT TERM GOAL #7   Title  further assess cognitve skills and set additional goal PRN    Time  6    Period  Weeks    Status  On-going        OT Long Term Goals - 10/20/17 1333      OT LONG TERM GOAL #1   Title  I with updated HEP.    Time  12    Period  Weeks    Status  New      OT LONG TERM GOAL #2   Title  Pt will demonstrate improved fine motor coordination  for ADLs as evidenced by decreasing 9 hole peg test score to 38 secs or less.    Baseline  RUE 26.94 sec, LUE 44.97 secs    Time  12    Period  Weeks    Status  New      OT LONG TERM GOAL #3   Title  Pt will perform simple cooking task modified indpendently demonstrating good safety awareness.    Time  12    Period  Weeks    Status  New      OT LONG TERM GOAL #4   Title  Pt will perfrom tabletop and environmental scanning activities with 90% or better accurary in prep for ADLs and work activities.    Time  12    Period  Weeks    Status  New      OT LONG TERM GOAL #5  Title  Pt will demonstrate adequate LUE strength and control to retrieve a 5 lbs weight from mid level shelf x 5 reps without drops.    Time  12    Period  Weeks    Status  New            Plan - 10/20/17 1333    Clinical Impression Statement  Pt in agreement with goals and POC. Pt progressing toward goals.     Occupational Profile and client history currently impacting functional performance  Prior to CVA,  pt was completely I, working full time and driving. Pt currently is unable to work, drive and he requires assist with ADLs/IADLs PMH: HTN, smoker    Occupational performance deficits (Please refer to evaluation for details):  ADL's;IADL's;Work;Leisure;Social Participation    Rehab Potential  Good    Current Impairments/barriers affecting progress:  severity of deficits    OT Frequency  2x / week    OT Duration  12 weeks    OT Treatment/Interventions  Self-care/ADL training;Electrical Stimulation;Therapeutic exercise;Visual/perceptual remediation/compensation;Coping strategies training;Patient/family education;Splinting;Neuromuscular education;Paraffin;Moist Heat;Fluidtherapy;Energy conservation;Therapeutic activities;Cognitive remediation/compensation;Balance training;Passive range of motion;Manual Therapy;DME and/or AE instruction;Contrast Bath;Ultrasound;Cryotherapy    Plan  environmental scanning/ visual perceptual skills, memory (retrieval), coordination, functional activities to address L neglect.    Consulted and Agree with Plan of Care  Patient       Patient will benefit from skilled therapeutic intervention in order to improve the following deficits and impairments:  Abnormal gait, Decreased cognition, Decreased knowledge of use of DME, Impaired flexibility, Impaired vision/preception, Decreased mobility, Decreased coordination, Decreased activity tolerance, Decreased endurance, Decreased range of motion, Decreased strength, Impaired UE functional use, Impaired perceived functional ability, Difficulty walking, Decreased safety awareness, Decreased knowledge of precautions, Decreased balance  Visit Diagnosis: Hemiplegia and hemiparesis following cerebral infarction affecting left non-dominant side (HCC)  Unsteadiness on feet  Muscle weakness (generalized)  Abnormal posture  Visuospatial deficit  Cognitive social or emotional deficit following cerebral infarction  Other lack of  coordination    Problem List Patient Active Problem List   Diagnosis Date Noted  . Hyperglycemia 10/17/2017  . Acute cerebral infarction (HCC) 09/30/2017  . OSA (obstructive sleep apnea) 09/30/2017  . Smoker 09/30/2017  . 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 Health care maintenance 06/20/2013  . Severe obesity (BMI 35.0-39.9) with comorbidity (HCC) 12/15/2011  . Skin rash 07/07/2011  . HTN (hypertension)     Norton Pastel ,OTR/L 10/20/2017, 1:37 PM  [facility] [address] [place], [place], [address] Phone: [phone]   Fax:  [phone]  Name: Thomas Howard MRN: [id number] Date of Birth: 11-06-1960

## 2017-10-25 ENCOUNTER — Encounter: Payer: Self-pay | Admitting: Physical Therapy

## 2017-10-25 ENCOUNTER — Ambulatory Visit: Payer: BLUE CROSS/BLUE SHIELD | Admitting: Physical Therapy

## 2017-10-25 ENCOUNTER — Encounter: Payer: Self-pay | Admitting: Family Medicine

## 2017-10-25 DIAGNOSIS — R293 Abnormal posture: Secondary | ICD-10-CM | POA: Diagnosis not present

## 2017-10-25 DIAGNOSIS — R2681 Unsteadiness on feet: Secondary | ICD-10-CM | POA: Diagnosis not present

## 2017-10-25 DIAGNOSIS — M6281 Muscle weakness (generalized): Secondary | ICD-10-CM

## 2017-10-25 DIAGNOSIS — R2689 Other abnormalities of gait and mobility: Secondary | ICD-10-CM

## 2017-10-25 DIAGNOSIS — R41842 Visuospatial deficit: Secondary | ICD-10-CM | POA: Diagnosis not present

## 2017-10-25 DIAGNOSIS — I69315 Cognitive social or emotional deficit following cerebral infarction: Secondary | ICD-10-CM | POA: Diagnosis not present

## 2017-10-25 DIAGNOSIS — I69354 Hemiplegia and hemiparesis following cerebral infarction affecting left non-dominant side: Secondary | ICD-10-CM | POA: Diagnosis not present

## 2017-10-25 DIAGNOSIS — R41841 Cognitive communication deficit: Secondary | ICD-10-CM | POA: Diagnosis not present

## 2017-10-25 DIAGNOSIS — Z9181 History of falling: Secondary | ICD-10-CM | POA: Diagnosis not present

## 2017-10-25 DIAGNOSIS — R471 Dysarthria and anarthria: Secondary | ICD-10-CM | POA: Diagnosis not present

## 2017-10-25 DIAGNOSIS — R278 Other lack of coordination: Secondary | ICD-10-CM | POA: Diagnosis not present

## 2017-10-26 NOTE — Therapy (Signed)
[facility] [address] [place], [place], [address] Phone: [phone]   Fax:  [phone]  Physical Therapy Treatment  Patient Details  Name: Margarita Bobrowski MRN: [id number] Date of Birth: 22-May-1960 Referring Provider: Jerrye Bushy, MD   Encounter Date: 10/25/2017  PT End of Session - 10/25/17 1152    Visit Number  4    Authorization Type  BCBS    PT Start Time  1149    PT Stop Time  1230    PT Time Calculation (min)  41 min    Equipment Utilized During Treatment  Gait belt    Activity Tolerance  Patient tolerated treatment well    Behavior During Therapy  [facility] for tasks assessed/performed       Past Medical History:  Diagnosis Date  . History of smoking   . HTN (hypertension)   . Obesity   . Wears partial dentures     Past Surgical History:  Procedure Laterality Date  . MOUTH SURGERY    . WISDOM TOOTH EXTRACTION      There were no vitals filed for this visit.  Subjective Assessment - 10/25/17 1152    Subjective  No new complaints. No falls. Continue to walk dog without any issues, April is there some of the times, not all of them.     Pertinent History  HTN, + smoker,    Limitations  Lifting;Standing;Walking;House hold activities    Patient Stated Goals  To not be so fatigued with every day activities.     Currently in Pain?  No/denies         [facility] Adult PT Treatment/Exercise - 10/25/17 1154      High Level Balance   High Level Balance Activities  Marching forwards;Marching backwards;Tandem walking tandem/toe walking fwd/bwd    High Level Balance Comments  on red mats next to counter top: intermittent touch to counter for balance with cues on posture and ex form/technique. min guard to min assist for 3 laps each.           Balance Exercises - 10/25/17 1207      Balance Exercises: Standing   SLS with Vectors  Foam/compliant surface;Limitations    Balance Beam  standing across red foam  beam:alternating fwd heel taps to floor/back to beam, then alternating bwd toe taps to floor/back onto beam x 10 reps each. Cues on step length, step height and weight shifting needed with this activity. Min guard assist for balance.    Step Over Hurdles / Cones  hurdles of varied heights on both red mats: reciprocal stepping over them x 6 laps forward with no UE support, min assist on 1st lap, then min guard assist with remaining laps. Cues for increased hip/knee flexion to clear hurdle.     Sit to Stand Time  seated with feet across red foam beam: sit<>stands without UE support x 10 reps. No UE support with cues on full upright standing and slow, controlled descent with sitting back down. Min guard to min assist at times.      Balance Exercises: Standing   SLS with Vectors Limitations  6 cones along edges of red mats: alternating fwd toe taps with each foot to a cone with side stepping left<>right x 1 lap each way, then alternating fwd double toe taps with each foot to each cone with side stepping left<><right x 1 lap each way, progressing to flipping cone over/up alternating which foot does this with each cone with side stepping  left<>right x 1 lap each way. Min assist needed with cues on stance position, weight shifting and to slow down for improved balance.             PT Long Term Goals - 10/12/17 2000      PT LONG TERM GOAL #1   Title  Patient demonstrates & verbalizes understanding of ongoing fitness plan /  HEP. (All LTGs Target  Date: 11/10/2017)    Time  4    Period  Weeks    Status  New    Target Date  11/10/17      PT LONG TERM GOAL #2   Title  Patient verbalizes understanding of CVA risk factors & signs/symptoms.    Time  4    Period  Weeks    Status  New    Target Date  11/10/17      PT LONG TERM GOAL #3   Title  Berg Balance >52/56 to indicate lower fall risk    Time  4    Period  Weeks    Status  New    Target Date  11/10/17      PT LONG TERM GOAL #4   Title   Functional Gait Assessment >/= 24/30    Time  4    Period  Weeks    Status  New    Target Date  11/10/17      PT LONG TERM GOAL #5   Title  assess 6-minute Walk Test & set goal    Time  4    Period  Weeks    Status  New    Target Date  11/10/17      Additional Long Term Goals   Additional Long Term Goals  Yes      PT LONG TERM GOAL #6   Title  Patient demonstrates work simulation tasks safely.     Time  4    Period  Weeks    Status  New    Target Date  11/10/17           10/25/17 1153  Plan  Clinical Impression Statement Today's skilled session continued to focus on balance reactions with no significant issues noted. Pt continues to be challenged by complaint surfaces and needs continued work on these surfaces to improve balance. Pt is progressing toward goals and should benefit from continued PT to progress toward unmet goals.   Pt will benefit from skilled therapeutic intervention in order to improve on the following deficits Abnormal gait;Decreased activity tolerance;Decreased balance;Decreased endurance;Decreased coordination;Decreased mobility;Decreased strength;Dizziness;Postural dysfunction  Rehab Potential Good  Clinical Impairments Affecting Rehab Potential LLE weakness, high fall risk, significant other has noticed impaired processing verbal instructions  PT Frequency 2x / week  PT Duration 4 weeks  PT Treatment/Interventions ADLs/Self Care Home Management;Canalith Repostioning;DME Instruction;Gait training;Stair training;Functional mobility training;Therapeutic activities;Therapeutic exercise;Balance training;Neuromuscular re-education;Patient/family education;Vestibular  PT Next Visit Plan dynamic standing balance on compliant surface, L LE strengthening; resistance gait  Consulted and Agree with Plan of Care Patient;Family member/caregiver  Family Member Consulted girlfriend, April Blackwell        Patient will benefit from skilled therapeutic intervention in  order to improve the following deficits and impairments:  Abnormal gait, Decreased activity tolerance, Decreased balance, Decreased endurance, Decreased coordination, Decreased mobility, Decreased strength, Dizziness, Postural dysfunction  Visit Diagnosis: Hemiplegia and hemiparesis following cerebral infarction affecting left non-dominant side (HCC)  Unsteadiness on feet  Muscle weakness (generalized)  Abnormal posture  Other abnormalities of gait and  mobility     Problem List Patient Active Problem List   Diagnosis Date Noted  . Hyperglycemia 10/17/2017  . Acute cerebral infarction (HCC) 09/30/2017  . OSA (obstructive sleep apnea) 09/30/2017  . Smoker 09/30/2017  . 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 Health care maintenance 06/20/2013  . Severe obesity (BMI 35.0-39.9) with comorbidity (HCC) 12/15/2011  . Skin rash 07/07/2011  . HTN (hypertension)     Sallyanne KusterKathy Dabney Schanz, PTA, [facility] [address], [address] [place], [place] [address] [phone] 10/27/17, 8:15 AM   Name: Antionette FairyDarvin Schimek MRN: [id number] Date of Birth: [birth year]

## 2017-10-27 ENCOUNTER — Ambulatory Visit: Payer: BLUE CROSS/BLUE SHIELD | Admitting: Physical Therapy

## 2017-10-27 ENCOUNTER — Encounter: Payer: Self-pay | Admitting: Physical Therapy

## 2017-10-27 DIAGNOSIS — R41841 Cognitive communication deficit: Secondary | ICD-10-CM | POA: Diagnosis not present

## 2017-10-27 DIAGNOSIS — R2681 Unsteadiness on feet: Secondary | ICD-10-CM

## 2017-10-27 DIAGNOSIS — R41842 Visuospatial deficit: Secondary | ICD-10-CM | POA: Diagnosis not present

## 2017-10-27 DIAGNOSIS — R293 Abnormal posture: Secondary | ICD-10-CM

## 2017-10-27 DIAGNOSIS — R471 Dysarthria and anarthria: Secondary | ICD-10-CM | POA: Diagnosis not present

## 2017-10-27 DIAGNOSIS — Z9181 History of falling: Secondary | ICD-10-CM | POA: Diagnosis not present

## 2017-10-27 DIAGNOSIS — R2689 Other abnormalities of gait and mobility: Secondary | ICD-10-CM | POA: Diagnosis not present

## 2017-10-27 DIAGNOSIS — M6281 Muscle weakness (generalized): Secondary | ICD-10-CM | POA: Diagnosis not present

## 2017-10-27 DIAGNOSIS — I69315 Cognitive social or emotional deficit following cerebral infarction: Secondary | ICD-10-CM | POA: Diagnosis not present

## 2017-10-27 DIAGNOSIS — R278 Other lack of coordination: Secondary | ICD-10-CM | POA: Diagnosis not present

## 2017-10-27 DIAGNOSIS — I69354 Hemiplegia and hemiparesis following cerebral infarction affecting left non-dominant side: Secondary | ICD-10-CM

## 2017-10-27 NOTE — Therapy (Signed)
[facility] [address] [place], [place], [address] Phone: [phone]   Fax:  [phone]  Physical Therapy Treatment  Patient Details  Name: Thomas Howard MRN: [id number] Date of Birth: June 11, 1960 Referring Provider: Jerrye Bushy, MD   Encounter Date: 10/27/2017  PT End of Session - 10/27/17 0932    Visit Number  5    Number of Visits  9    Authorization Type  BCBS    PT Start Time  0845    PT Stop Time  0924    PT Time Calculation (min)  39 min    Equipment Utilized During Treatment  Gait belt    Activity Tolerance  Patient tolerated treatment well    Behavior During Therapy  [facility] for tasks assessed/performed       Past Medical History:  Diagnosis Date  . History of smoking   . HTN (hypertension)   . Obesity   . Wears partial dentures     Past Surgical History:  Procedure Laterality Date  . MOUTH SURGERY    . WISDOM TOOTH EXTRACTION      There were no vitals filed for this visit.  Subjective Assessment - 10/27/17 0848    Subjective  Pt brought job description to PT. Doesn't think he'll be ready to return to work till the end on the summer. Job invloves 8 hrs of:  Push/Pull 120#, lifting over head 50#, climbing stairs x3, 12".    Pertinent History  HTN, + smoker,    Limitations  Lifting;Standing;Walking;House hold activities    Patient Stated Goals  To not be so fatigued with every day activities.     Currently in Pain?  No/denies                       [facility] Adult PT Treatment/Exercise - 10/27/17 0001      Ambulation/Gait   Ambulation/Gait  Yes    Ambulation/Gait Assistance  7: Independent    Ambulation/Gait Assistance Details  practising visual scanning    Ambulation Distance (Feet)  200 Feet    Assistive device  None    Gait Pattern  Step-through pattern;Decreased arm swing - left;Decreased step length - right;Decreased stance time - left;Decreased hip/knee flexion -  left;Decreased weight shift to left;Left hip hike;Antalgic;Lateral hip instability;Trunk flexed;Wide base of support    Ambulation Surface  Level;Indoor      Exercises   Exercises  Lumbar      Lumbar Exercises: Quadruped   Straight Leg Raise  5 reps;5 seconds      Knee/Hip Exercises: Aerobic   Elliptical  L= 3 to 4  Post BP 181/89, HR 109 bpm          Balance Exercises - 10/27/17 0913      Balance Exercises: Standing   Stepping Strategy  Anterior;Posterior;Foam/compliant surface progress with upright gaze    Step Ups  Forward 8" step leading with L LE; ascending and descending backward        PT Education - 10/27/17 0904    Education Details  Discussed current schedule, date of LTG check, work requirements and concerns with going back to work,    [employer]) Educated  Patient    Methods  Explanation    Comprehension  Verbalized understanding          PT Long Term Goals - 10/12/17 2000      PT LONG TERM GOAL #1   Title  Patient demonstrates & verbalizes  understanding of ongoing fitness plan /  HEP. (All LTGs Target  Date: 11/10/2017)    Time  4    Period  Weeks    Status  New    Target Date  11/10/17      PT LONG TERM GOAL #2   Title  Patient verbalizes understanding of CVA risk factors & signs/symptoms.    Time  4    Period  Weeks    Status  New    Target Date  11/10/17      PT LONG TERM GOAL #3   Title  Berg Balance >52/56 to indicate lower fall risk    Time  4    Period  Weeks    Status  New    Target Date  11/10/17      PT LONG TERM GOAL #4   Title  Functional Gait Assessment >/= 24/30    Time  4    Period  Weeks    Status  New    Target Date  11/10/17      PT LONG TERM GOAL #5   Title  assess 6-minute Walk Test & set goal    Time  4    Period  Weeks    Status  New    Target Date  11/10/17      Additional Long Term Goals   Additional Long Term Goals  Yes      PT LONG TERM GOAL #6   Title  Patient demonstrates work simulation tasks  safely.     Time  4    Period  Weeks    Status  New    Target Date  11/10/17            Plan - 10/27/17 0926    Clinical Impression Statement  Pt brought in job description today. Job involves intense physical labour with heavy lifting and negotiating high steps repeatedly.  Started treatment with pt on Elliptical machine for 5 min to increase physical challenge to the rest of the balance and strength training; pt was challenged throughout session. Pt continues to require intermittent UE support with dynamic balance activities on compliant surface.                                                                                Rehab Potential  Good    Clinical Impairments Affecting Rehab Potential  LLE weakness, high fall risk, significant other has noticed impaired processing verbal instructions    PT Frequency  2x / week    PT Duration  4 weeks    PT Treatment/Interventions  ADLs/Self Care Home Management;Canalith Repostioning;DME Instruction;Gait training;Stair training;Functional mobility training;Therapeutic activities;Therapeutic exercise;Balance training;Neuromuscular re-education;Patient/family education;Vestibular    PT Next Visit Plan  start with Elliptical to increase intensity during session? dynamic standing balance on compliant surface, L LE strengthening; resistance gait    Consulted and Agree with Plan of Care  Patient;Family member/caregiver    Family Member Consulted  girlfriend, April Blackwell       Patient will benefit from skilled therapeutic intervention in order to improve the following deficits and impairments:  Abnormal gait, Decreased activity tolerance, Decreased balance, Decreased endurance, Decreased coordination, Decreased  mobility, Decreased strength, Dizziness, Postural dysfunction  Visit Diagnosis: Hemiplegia and hemiparesis following cerebral infarction affecting left non-dominant side (HCC)  Unsteadiness on feet  Muscle weakness  (generalized)  Abnormal posture     Problem List Patient Active Problem List   Diagnosis Date Noted  . Hyperglycemia 10/17/2017  . Acute cerebral infarction (HCC) 09/30/2017  . OSA (obstructive sleep apnea) 09/30/2017  . Smoker 09/30/2017  . 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 Health care maintenance 06/20/2013  . Severe obesity (BMI 35.0-39.9) with comorbidity (HCC) 12/15/2011  . Skin rash 07/07/2011  . HTN (hypertension)     Hortencia Conradi, PTA  10/27/17, 10:36 AM [facility] [address] [place], [place], [address] Phone: [phone]   Fax:  [phone]  Name: Thomas Howard MRN: [id number] Date of Birth: 1961/01/27

## 2017-10-28 ENCOUNTER — Telehealth: Payer: Self-pay

## 2017-10-28 NOTE — Telephone Encounter (Signed)
Left message on vm per dpr asking pt to call back to schedule a follow up OV mid July 2019, per Dr. Glory RosebushG. [See Pt email, 10/25/17.]

## 2017-10-28 NOTE — Telephone Encounter (Signed)
Letter printed and in Lisa's box. plz schedule f/u mid next month in office.  [facility] fax number [phone]

## 2017-10-28 NOTE — Telephone Encounter (Signed)
Faxed letter to [facility].  Also, lvm asking pt to call back to schedule f/u OV mid July 2019.

## 2017-11-01 ENCOUNTER — Ambulatory Visit: Payer: BLUE CROSS/BLUE SHIELD | Admitting: Occupational Therapy

## 2017-11-01 ENCOUNTER — Encounter: Payer: Self-pay | Admitting: Occupational Therapy

## 2017-11-01 ENCOUNTER — Encounter: Payer: Self-pay | Admitting: Physical Therapy

## 2017-11-01 ENCOUNTER — Ambulatory Visit: Payer: BLUE CROSS/BLUE SHIELD | Admitting: Physical Therapy

## 2017-11-01 DIAGNOSIS — I69354 Hemiplegia and hemiparesis following cerebral infarction affecting left non-dominant side: Secondary | ICD-10-CM

## 2017-11-01 DIAGNOSIS — M6281 Muscle weakness (generalized): Secondary | ICD-10-CM | POA: Diagnosis not present

## 2017-11-01 DIAGNOSIS — I69315 Cognitive social or emotional deficit following cerebral infarction: Secondary | ICD-10-CM

## 2017-11-01 DIAGNOSIS — Z9181 History of falling: Secondary | ICD-10-CM | POA: Diagnosis not present

## 2017-11-01 DIAGNOSIS — R41842 Visuospatial deficit: Secondary | ICD-10-CM | POA: Diagnosis not present

## 2017-11-01 DIAGNOSIS — R2681 Unsteadiness on feet: Secondary | ICD-10-CM

## 2017-11-01 DIAGNOSIS — R41841 Cognitive communication deficit: Secondary | ICD-10-CM | POA: Diagnosis not present

## 2017-11-01 DIAGNOSIS — R278 Other lack of coordination: Secondary | ICD-10-CM

## 2017-11-01 DIAGNOSIS — R293 Abnormal posture: Secondary | ICD-10-CM

## 2017-11-01 DIAGNOSIS — R2689 Other abnormalities of gait and mobility: Secondary | ICD-10-CM | POA: Diagnosis not present

## 2017-11-01 DIAGNOSIS — R471 Dysarthria and anarthria: Secondary | ICD-10-CM | POA: Diagnosis not present

## 2017-11-01 NOTE — Therapy (Signed)
[facility] [address] [place], [place], [address] Phone: [phone]   Fax:  [phone]  Occupational Therapy Treatment  Patient Details  Name: Thomas Howard MRN: [id number] Date of Birth: Apr 26, 1961 Referring Provider: Jerrye Bushy, MD   Encounter Date: 11/01/2017  OT End of Session - 11/01/17 1556    Visit Number  3    Number of Visits  25    Date for OT Re-Evaluation  01/13/18    Authorization Type  BCBS - 60 visits combined for OT/PT, if occurs same day counts as one visit    Authorization - Visit Number  3    Authorization - Number of Visits  30    OT Start Time  1447    OT Stop Time  1529    OT Time Calculation (min)  42 min    Activity Tolerance  Patient tolerated treatment well       Past Medical History:  Diagnosis Date  . History of smoking   . HTN (hypertension)   . Obesity   . Wears partial dentures     Past Surgical History:  Procedure Laterality Date  . MOUTH SURGERY    . WISDOM TOOTH EXTRACTION      There were no vitals filed for this visit.  Subjective Assessment - 11/01/17 1451    Subjective   I worked on those coordination things at home    Pertinent History  hospitalized 09/22/17-09/25/17 at [facility] with acute nonhemorrhagic right basal ganglia lacunar infarct and HTN.    Patient Stated Goals  regain use of LUE    Currently in Pain?  No/denies                   OT Treatments/Exercises (OP) - 11/01/17 0001      ADLs   ADL Comments  Pt brought in job description at therapist's request. Will continue to incorporate work related skills where possible.       Neurological Re-education Exercises   Other Exercises 1  Neuro re ed to develop HEP for proximal strength and working toward improved functional overhead reach. In sitting pt can now reach to approximately 145* of shoulder flexion however has compensations (mild elbow flexion, scapula abducted,  elevated).  Pt has been using 10 pound weight at home in sitting doing straight overhead reach from shoulder.  Discussed malalignment and goal of more normal reach pattern for overhead reach as well as strength with overhead reach. Pt verbalized understanding.  Pt given exercises in supine as well as prone to promote improved alignment and more normal movement pattern for overhead reach along with strengthening. Pt able to return demonstrate all activities.               OT Education - 11/01/17 1553    Education Details  HEP for proximal LUE strength       OT Short Term Goals - 11/01/17 1555      OT SHORT TERM GOAL #1   Title  I with inital HEP. due 11/29/17    Time  6    Period  Weeks    Status  On-going      OT SHORT TERM GOAL #2   Title  Pt will verbalize understanding of compensatory strategies for visual perceptual  impairment    Time  6    Period  Weeks    Status  On-going      OT SHORT TERM GOAL #3   Title  Pt will perform all basic ADLS with modified indpendently with no more than 2 rest breaks    Time  6    Period  Weeks    Status  On-going      OT SHORT TERM GOAL #4   Title  Pt will perform tabletop scanning activities with 80% or better accuracy for improved safety during ADLs.    Baseline  60% on 1.5 M number cancellation, mitems missed primarily L of midline    Time  6    Period  Weeks    Status  On-going      OT SHORT TERM GOAL #5   Title  Pt will perfrom basic environmental scanning with 80% or better accuracy in prep for driving.    Time  6    Period  Weeks    Status  On-going      OT SHORT TERM GOAL #6   Title  Pt will demonstrate ability to retrieve a lightweight object at 130* shoulder flexion without dropping with LUE     Time  6    Period  Weeks    Status  Achieved      OT SHORT TERM GOAL #7   Title  further assess cognitve skills and set additional goal PRN    Time  6    Period  Weeks    Status  On-going        OT Long Term Goals -  11/01/17 1555      OT LONG TERM GOAL #1   Title  I with updated HEP.    Time  12    Period  Weeks    Status  New      OT LONG TERM GOAL #2   Title  Pt will demonstrate improved fine motor coordination  for ADLs as evidenced by decreasing 9 hole peg test score to 38 secs or less.    Baseline  RUE 26.94 sec, LUE 44.97 secs    Time  12    Period  Weeks    Status  New      OT LONG TERM GOAL #3   Title  Pt will perform simple cooking task modified indpendently demonstrating good safety awareness.    Time  12    Period  Weeks    Status  New      OT LONG TERM GOAL #4   Title  Pt will perfrom tabletop and environmental scanning activities with 90% or better accurary in prep for ADLs and work activities.    Time  12    Period  Weeks    Status  New      OT LONG TERM GOAL #5   Title  Pt will demonstrate adequate LUE strength and control to retrieve a 5 lbs weight from mid level shelf x 5 reps without drops.    Time  12    Period  Weeks    Status  New            Plan - 11/01/17 1555    Clinical Impression Statement  Pt progressing toward goals. Pt has significant improvement on LUE AROM    Occupational Profile and client history currently impacting functional performance  Prior to CVA, pt was completely I, working full time and driving. Pt currently is unable to work, drive and he requires assist with ADLs/IADLs PMH: HTN, smoker    Occupational performance deficits (Please refer to evaluation for details):  ADL's;IADL's;Work;Leisure;Social Participation    Rehab Potential  Good    Current Impairments/barriers affecting progress:  severity of deficits    OT Frequency  2x / week    OT Duration  12 weeks    OT Treatment/Interventions  Self-care/ADL training;Electrical Stimulation;Therapeutic exercise;Visual/perceptual remediation/compensation;Coping strategies training;Patient/family education;Splinting;Neuromuscular education;Paraffin;Moist Heat;Fluidtherapy;Energy  conservation;Therapeutic activities;Cognitive remediation/compensation;Balance training;Passive range of motion;Manual Therapy;DME and/or AE instruction;Contrast Bath;Ultrasound;Cryotherapy    Plan  environmental scanning/ visual perceptual skills, memory (retrieval), coordination, functional activities to address L neglect.    Consulted and Agree with Plan of Care  Patient       Patient will benefit from skilled therapeutic intervention in order to improve the following deficits and impairments:  Abnormal gait, Decreased cognition, Decreased knowledge of use of DME, Impaired flexibility, Impaired vision/preception, Decreased mobility, Decreased coordination, Decreased activity tolerance, Decreased endurance, Decreased range of motion, Decreased strength, Impaired UE functional use, Impaired perceived functional ability, Difficulty walking, Decreased safety awareness, Decreased knowledge of precautions, Decreased balance  Visit Diagnosis: Hemiplegia and hemiparesis following cerebral infarction affecting left non-dominant side (HCC)  Unsteadiness on feet  Muscle weakness (generalized)  Abnormal posture  Visuospatial deficit  Cognitive social or emotional deficit following cerebral infarction  Other lack of coordination    Problem List Patient Active Problem List   Diagnosis Date Noted  . Hyperglycemia 10/17/2017  . Acute cerebral infarction (HCC) 09/30/2017  . OSA (obstructive sleep apnea) 09/30/2017  . Smoker 09/30/2017  . 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 Health care maintenance 06/20/2013  . Severe obesity (BMI 35.0-39.9) with comorbidity (HCC) 12/15/2011  . Skin rash 07/07/2011  . HTN (hypertension)     Norton Pastelulaski, Ellyson Rarick Halliday, OTR/L 11/01/2017, 3:59 PM  [facility] [address] [place], [place], [address] Phone: [phone]   Fax:  [phone]  Name: Thomas Howard MRN:  [id number] Date of Birth: 1960-08-06

## 2017-11-01 NOTE — Patient Instructions (Signed)
Access Code: [email or web address]  URL: [email or web address]  Date: 11/01/2017  Prepared by: Mackie PaiKaren Breonia Kirstein   Program Notes  we used a 10 pound weight for the ones laying on your back in the gym. We used a 4 pound weight for the one where you lay on your stomach.   Exercises  Supine Shoulder Press - 8 reps - 2 sets - 0 seconds hold - 2x daily - 7x weekly  Supine Alternating Shoulder Flexion - 8 reps - 2 sets - 0 seconds hold - 2x daily - 7x weekly  Prone Shoulder Extension - 5 reps - 2 sets - 0 seconds hold - 2x daily - 7x weekly  ++

## 2017-11-01 NOTE — Patient Instructions (Signed)
Fitness Plan has 4 components.  1. Endurance - Recommend machines that are sitting with back support that uses both your arms and your legs.Or can do walking program Goal is 20-30 minutes. 2. Strength - weight machines enough weight to have resistance but not so much that you have to strain or cheat.  Goal is to do 15 repetitions for 2-3 sets. Rest 30-60 seconds between sets.  Do 2-4 leg machines, 2-4 arm machines & 2-4 trunk machines. Look for pictures to make sure you exercise both sides of arm, leg or trunk.  Leg machines like leg press (can do both legs or each leg by themselves),   At home can do theraband exercises for arms or legs, floor transfers, sit to stand to sit using arms as little as possible, Yoga positions 3.Flexibility - make sure you include arms, legs & trunk. Can do Yoga also 4. Balance- can work in corner with chair in front - head turns, arm motions, eyes closed; place one foot inside cabinet or on 4-6" block to work on one-legged stance,   Try to get to each component 3-5 times per week.  Going to [employer] or fitness center you want do things you can not do at home.  For example use bikes at [employer] if you don't have one at home. Then on days you don't go to [employer], do exercises at home.  At least 150 minutes a week of moderate intensity exercise. Try not to go more than 2 days in a row without being active for at least 30 minutes a day. Any activity where you are up and moving is good- Walking, bicycling, stationary bicycling, dancing.  (moderate intensity means to get  a little out of breath)   Do resistance exercise at least 2 times a week.  This can be yoga poses or strength training where you lift your own weight (think leg lifts or toe raises)  or light weights like cans of beans with your arms.    Flexibility and balance exercise- safe stretching and practicing balance is very important to health.  

## 2017-11-02 NOTE — Therapy (Signed)
[facility] [address] [place], [place], [address] Phone: [phone]   Fax:  [phone]  Physical Therapy Treatment  Patient Details  Name: Thomas FairyDarvin Harm MRN: [id number] Date of Birth: 1961/03/28 Referring Provider: Jerrye BushyJavier Guetierrez, MD   Encounter Date: 11/01/2017  PT End of Session - 11/01/17 2144    Visit Number  6    Number of Visits  9    Authorization Type  BCBS    PT Start Time  1315    PT Stop Time  1400    PT Time Calculation (min)  45 min    Equipment Utilized During Treatment  Gait belt    Activity Tolerance  Patient tolerated treatment well    Behavior During Therapy  [facility] for tasks assessed/performed       Past Medical History:  Diagnosis Date  . History of smoking   . HTN (hypertension)   . Obesity   . Wears partial dentures     Past Surgical History:  Procedure Laterality Date  . MOUTH SURGERY    . WISDOM TOOTH EXTRACTION      There were no vitals filed for this visit.   Therapeutic Exercise: See pt education Treadmill: PT instructed in safety with getting on/off, starting/stopping and adjusting speed. Pt ambulated 5 min starting at 1.5mph and increased to 2.510mph. Verbal cues on step length & upright posture Bilateral leg press 100# 15 reps 2 sets PT instructed in how to determine proper weight with too little (80#) & too much (110#)  Bilateral chest press 30# 10 reps 2 sets verbal cues in posture with equal weight bearing Row 30# 10 reps 2 sets verbal cues on posture with equal weight bearing PT instructed to initiate with 4-6 UE machines & 4-6 LE machines working antagonist muscle groups. Pt verbalized understanding.   Therapeutic Activities: In parallel bars with mirror for visual feedback, tactile & verbal cues for balance: rocker board ant/post and medial/lateral stabilization with head turns and weight shifts with stabilization / recovery.  Standing crossways on foam beam: eyes  open head turns right/left, up/down and diagonals.                          PT Education - 11/01/17 2150    Education Details  Well-rounded fitness program with components to address endurance, strength, flexibility & balance    Person(s) Educated  Patient;Caregiver(s)    Methods  Explanation;Demonstration;Tactile cues;Verbal cues;Handout    Comprehension  Verbalized understanding;Returned demonstration;Verbal cues required;Tactile cues required;Need further instruction          PT Long Term Goals - 11/01/17 2145      PT LONG TERM GOAL #1   Title  Patient demonstrates & verbalizes understanding of ongoing fitness plan /  HEP. (All LTGs Target  Date: 11/10/2017)    Time  4    Period  Weeks    Status  On-going    Target Date  11/10/17      PT LONG TERM GOAL #2   Title  Patient verbalizes understanding of CVA risk factors & signs/symptoms.    Time  4    Period  Weeks    Status  On-going    Target Date  11/10/17      PT LONG TERM GOAL #3   Title  Berg Balance >52/56 to indicate lower fall risk    Time  4    Period  Weeks    Status  On-going    Target Date  11/10/17      PT LONG TERM GOAL #4   Title  Functional Gait Assessment >/= 24/30    Time  4    Period  Weeks    Status  On-going    Target Date  11/10/17      PT LONG TERM GOAL #5   Title  6 Minute Walk Test without assistive device >1400' safely    Time  4    Period  Weeks    Status  On-going    Target Date  11/10/17      PT LONG TERM GOAL #6   Title  Patient demonstrates work simulation tasks safely.     Time  4    Period  Weeks    Status  On-going    Target Date  11/10/17            Plan - 11/01/17 2147    Clinical Impression Statement  Today's session focused on establishing a fitness program. He appears to have a basic understanding.     Rehab Potential  Good    Clinical Impairments Affecting Rehab Potential  LLE weakness, high fall risk, significant other has noticed impaired  processing verbal instructions    PT Frequency  2x / week    PT Duration  4 weeks    PT Treatment/Interventions  ADLs/Self Care Home Management;Canalith Repostioning;DME Instruction;Gait training;Stair training;Functional mobility training;Therapeutic activities;Therapeutic exercise;Balance training;Neuromuscular re-education;Patient/family education;Vestibular    PT Next Visit Plan  Educate on signs/symptoms & risk factors for CVA,  check if started or set-up plan for fitness plan,  dynamic gait multi-tasking / scanning    Consulted and Agree with Plan of Care  Patient;Family member/caregiver    Family Member Consulted  girlfriend, April Blackwell       Patient will benefit from skilled therapeutic intervention in order to improve the following deficits and impairments:  Abnormal gait, Decreased activity tolerance, Decreased balance, Decreased endurance, Decreased coordination, Decreased mobility, Decreased strength, Dizziness, Postural dysfunction  Visit Diagnosis: Hemiplegia and hemiparesis following cerebral infarction affecting left non-dominant side (HCC)  Unsteadiness on feet  Muscle weakness (generalized)  Abnormal posture  Other abnormalities of gait and mobility     Problem List Patient Active Problem List   Diagnosis Date Noted  . Hyperglycemia 10/17/2017  . Acute cerebral infarction (HCC) 09/30/2017  . OSA (obstructive sleep apnea) 09/30/2017  . Smoker 09/30/2017  . 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 Health care maintenance 06/20/2013  . Severe obesity (BMI 35.0-39.9) with comorbidity (HCC) 12/15/2011  . Skin rash 07/07/2011  . HTN (hypertension)     Gabrelle Roca PT, DPT 11/02/2017, 9:53 PM  [facility] [address] [place], [place], [address] Phone: [phone]   Fax:  [phone]  Name: Woodie Degraffenreid MRN: [id number] Date of Birth: [birth year]

## 2017-11-03 ENCOUNTER — Ambulatory Visit: Payer: BLUE CROSS/BLUE SHIELD | Admitting: Occupational Therapy

## 2017-11-03 ENCOUNTER — Ambulatory Visit: Payer: BLUE CROSS/BLUE SHIELD | Admitting: Physical Therapy

## 2017-11-04 ENCOUNTER — Encounter: Payer: Self-pay | Admitting: Family Medicine

## 2017-11-05 MED ORDER — AMLODIPINE BESYLATE 5 MG PO TABS: 5.0000 mg | ORAL_TABLET | Freq: Every day | ORAL | 1 refills | Status: DC

## 2017-11-08 ENCOUNTER — Ambulatory Visit: Payer: BLUE CROSS/BLUE SHIELD | Admitting: Occupational Therapy

## 2017-11-08 ENCOUNTER — Encounter: Payer: Self-pay | Admitting: Occupational Therapy

## 2017-11-08 ENCOUNTER — Encounter: Payer: Self-pay | Admitting: Physical Therapy

## 2017-11-08 ENCOUNTER — Ambulatory Visit: Payer: BLUE CROSS/BLUE SHIELD | Attending: Family Medicine | Admitting: Physical Therapy

## 2017-11-08 VITALS — BP 148/100

## 2017-11-08 VITALS — BP 163/96 | HR 79

## 2017-11-08 DIAGNOSIS — I69354 Hemiplegia and hemiparesis following cerebral infarction affecting left non-dominant side: Secondary | ICD-10-CM

## 2017-11-08 DIAGNOSIS — R41842 Visuospatial deficit: Secondary | ICD-10-CM

## 2017-11-08 DIAGNOSIS — R278 Other lack of coordination: Secondary | ICD-10-CM | POA: Insufficient documentation

## 2017-11-08 DIAGNOSIS — Z9181 History of falling: Secondary | ICD-10-CM | POA: Insufficient documentation

## 2017-11-08 DIAGNOSIS — R471 Dysarthria and anarthria: Secondary | ICD-10-CM | POA: Diagnosis not present

## 2017-11-08 DIAGNOSIS — R2689 Other abnormalities of gait and mobility: Secondary | ICD-10-CM | POA: Diagnosis not present

## 2017-11-08 DIAGNOSIS — I69315 Cognitive social or emotional deficit following cerebral infarction: Secondary | ICD-10-CM

## 2017-11-08 DIAGNOSIS — R261 Paralytic gait: Secondary | ICD-10-CM | POA: Diagnosis not present

## 2017-11-08 DIAGNOSIS — M6281 Muscle weakness (generalized): Secondary | ICD-10-CM

## 2017-11-08 DIAGNOSIS — R2681 Unsteadiness on feet: Secondary | ICD-10-CM | POA: Insufficient documentation

## 2017-11-08 DIAGNOSIS — R293 Abnormal posture: Secondary | ICD-10-CM

## 2017-11-08 DIAGNOSIS — R41841 Cognitive communication deficit: Secondary | ICD-10-CM | POA: Insufficient documentation

## 2017-11-08 NOTE — Therapy (Signed)
[facility] [address] [place], [place], [address] Phone: [phone]   Fax:  [phone]  Occupational Therapy Treatment  Patient Details  Name: Thomas Howard MRN: [id number] Date of Birth: 1960/07/17 Referring Provider: Jerrye Bushy, MD   Encounter Date: 11/08/2017  OT End of Session - 11/08/17 0858    Visit Number  4    Number of Visits  25    Date for OT Re-Evaluation  01/13/18    Authorization Type  [employer] - 60 visits combined for OT/PT, if occurs same day counts as one visit    Authorization - Visit Number  4    Authorization - Number of Visits  30    OT Start Time  0848    OT Stop Time  0930    OT Time Calculation (min)  42 min    Activity Tolerance  Patient tolerated treatment well    Behavior During Therapy  [facility] for tasks assessed/performed       Past Medical History:  Diagnosis Date  . History of smoking   . HTN (hypertension)   . Obesity   . Wears partial dentures     Past Surgical History:  Procedure Laterality Date  . MOUTH SURGERY    . WISDOM TOOTH EXTRACTION      Vitals:   11/08/17 0856  BP: (!) 148/100    Subjective Assessment - 11/08/17 0924    Subjective   Pt reports not feeling weel last week    Pertinent History  hospitalized 09/22/17-09/25/17 at [facility] with acute nonhemorrhagic right basal ganglia lacunar infarct and HTN.    Patient Stated Goals  regain use of LUE    Currently in Pain?  No/denies              Treatment: Pt reported that he had not eaten breakfast this am, BP was elevated , pt was provided with crackers and water. Pt is asymptomatic. Visual scanning activities on I-pad with min v.c for performance (scanning for a particular items then alphabetizing words). Small peg design, pt completed correctly, using LUE for increased fine motor coordination, min difficulty for in hand manipulation.  154/104-RUE,  155/99-LUE              OT  Education - 11/08/17 [phone]    Education Details  compensatory strategies for L neglect, home scanning activities    Person(s) Educated  Patient    Methods  Explanation;Demonstration;Handout    Comprehension  Verbalized understanding       OT Short Term Goals - 11/08/17 0859      OT SHORT TERM GOAL #1   Title  I with inital HEP. due 11/29/17    Status  On-going      OT SHORT TERM GOAL #2   Title  Pt will verbalize understanding of compensatory strategies for visual perceptual  impairment    Status  On-going      OT SHORT TERM GOAL #3   Title  Pt will perform all basic ADLS with modified indpendently with no more than 2 rest breaks    Status  On-going      OT SHORT TERM GOAL #4   Title  Pt will perform tabletop scanning activities with 80% or better accuracy for improved safety during ADLs.    Status  On-going      OT SHORT TERM GOAL #5   Title  Pt will perfrom basic environmental scanning with 80% or better accuracy in prep for driving.  Status  On-going      OT SHORT TERM GOAL #6   Title  Pt will demonstrate ability to retrieve a lightweight object at 130* shoulder flexion without dropping with LUE     Status  On-going      OT SHORT TERM GOAL #7   Title  further assess cognitve skills and set additional goal PRN    Status  On-going        OT Long Term Goals - 11/08/17 0921      OT LONG TERM GOAL #1   Title  I with updated HEP.    Time  12    Period  Weeks    Status  On-going      OT LONG TERM GOAL #2   Title  Pt will demonstrate improved fine motor coordination  for ADLs as evidenced by decreasing 9 hole peg test score to 38 secs or less.    Baseline  RUE 26.94 sec, LUE 44.97 secs    Time  12    Period  Weeks    Status  On-going      OT LONG TERM GOAL #3   Title  Pt will perform simple cooking task modified indpendently demonstrating good safety awareness.    Time  12    Period  Weeks    Status  On-going      OT LONG TERM GOAL #4   Title  Pt will perfrom  tabletop and environmental scanning activities with 90% or better accurary in prep for ADLs and work activities.    Time  12    Period  Weeks    Status  On-going      OT LONG TERM GOAL #5   Title  Pt will demonstrate adequate LUE strength and control to retrieve a 5 lbs weight from mid level shelf x 5 reps without drops.    Time  12    Period  Weeks    Status  On-going            Plan - 11/08/17 [id number]    Clinical Impression Statement  Pt progressing toward goals. Pt demonstrates improvements in visual perceptual skills.    Rehab Potential  Good    Current Impairments/barriers affecting progress:  severity of deficits    OT Frequency  2x / week    OT Duration  12 weeks    OT Treatment/Interventions  Self-care/ADL training;Electrical Stimulation;Therapeutic exercise;Visual/perceptual remediation/compensation;Coping strategies training;Patient/family education;Splinting;Neuromuscular education;Paraffin;Moist Heat;Fluidtherapy;Energy conservation;Therapeutic activities;Cognitive remediation/compensation;Balance training;Passive range of motion;Manual Therapy;DME and/or AE instruction;Contrast Bath;Ultrasound;Cryotherapy    Plan  environmental scanning/ visual perceptual skills, memory (retrieval), coordination, functional activities to address L neglect.    Consulted and Agree with Plan of Care  Patient       Patient will benefit from skilled therapeutic intervention in order to improve the following deficits and impairments:  Abnormal gait, Decreased cognition, Decreased knowledge of use of DME, Impaired flexibility, Impaired vision/preception, Decreased mobility, Decreased coordination, Decreased activity tolerance, Decreased endurance, Decreased range of motion, Decreased strength, Impaired UE functional use, Impaired perceived functional ability, Difficulty walking, Decreased safety awareness, Decreased knowledge of precautions, Decreased balance  Visit Diagnosis: Visuospatial  deficit  Cognitive social or emotional deficit following cerebral infarction  Other lack of coordination  Hemiplegia and hemiparesis following cerebral infarction affecting left non-dominant side [facility](HCC)    Problem List Patient Active Problem List   Diagnosis Date Noted  . Hyperglycemia 10/17/2017  . Acute cerebral infarction (HCC) 09/30/2017  . OSA (obstructive sleep apnea)  09/30/2017  . Smoker 09/30/2017  . 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 Health care maintenance 06/20/2013  . Severe obesity (BMI 35.0-39.9) with comorbidity (HCC) 12/15/2011  . Skin rash 07/07/2011  . HTN (hypertension)     Erhard Senske 11/08/2017, 9:24 AM  [facility] [address] [place], [place], [address] Phone: [phone]   Fax:  [phone]  Name: Thomas Howard MRN: [id number] Date of Birth: 07-08-1960

## 2017-11-08 NOTE — Patient Instructions (Signed)
1. Look for the edge of objects (to the left and/or right) so that you make sure you are seeing all of an object 2. Turn your head when walking, scan from side to side, particularly in busy environments 3. Use an organized scanning pattern. It's usually easier to scan from top to bottom, and left to right (like you are reading) 4. Double check yourself 5. Use a line guide (like a blank piece of paper) or your finger when reading 6. If necessary, place brightly colored tape at end of table or work area as a reminder to always look until you see the tape.  Activities to try at home to encourage visual scanning:   1. Word searches 2. Mazes 3. Puzzles 4. Card games 5. Computer games and/or searches 6. Connect-the-dots  Activities for environmental (larger) scanning:  1. With supervision, scan for items in grocery store or drugstore.  Begin with a familiar store, then progress to a new store you've never been in before. Make sure you have supervision with this.  2. With supervision, tell a family member or caregiver when it is safe to cross a street after looking all directions and any side streets. However, do NOT cross street unless family member or caregiver is with you and says it is OK    

## 2017-11-08 NOTE — Therapy (Signed)
[place] [address] [place], [place], [address] Phone: [phone]   Fax:  [phone]  Physical Therapy Treatment  Patient Details  Name: Thomas Howard MRN: [id number] Date of Birth: Oct 12, 1960 Referring Provider: Elson Clan, MD   Encounter Date: 11/08/2017  PT End of Session - 11/08/17 1014    Visit Number  7    Number of Visits  9    Authorization Type  BCBS    PT Start Time  0930    PT Stop Time  1010    PT Time Calculation (min)  40 min    Equipment Utilized During Treatment  Gait belt    Activity Tolerance  Patient tolerated treatment well    Behavior During Therapy  [facility] for tasks assessed/performed       Past Medical History:  Diagnosis Date  . History of smoking   . HTN (hypertension)   . Obesity   . Wears partial dentures     Past Surgical History:  Procedure Laterality Date  . MOUTH SURGERY    . WISDOM TOOTH EXTRACTION      Vitals:   11/08/17 0940  BP: (!) 163/96  Pulse: 79    Subjective Assessment - 11/08/17 0933    Subjective  No falls. His dog was pulling.     Pertinent History  HTN, + smoker,    Limitations  Lifting;Standing;Walking;House hold activities    Patient Stated Goals  To not be so fatigued with every day activities.     Currently in Pain?  No/denies         [facility] PT Assessment - 11/08/17 0930      Ambulation/Gait   Gait velocity  3.93 ft/sec  initial was 2.98 ft/sec      Berg Balance Test   Sit to Stand  Able to stand without using hands and stabilize independently    Standing Unsupported  Able to stand safely 2 minutes    Sitting with Back Unsupported but Feet Supported on Floor or Stool  Able to sit safely and securely 2 minutes    Stand to Sit  Sits safely with minimal use of hands    Transfers  Able to transfer safely, minor use of hands    Standing Unsupported with Eyes Closed  Able to stand 10 seconds safely    Standing Ubsupported with Feet  Together  Able to place feet together independently and stand 1 minute safely    From Standing, Reach Forward with Outstretched Arm  Can reach confidently >25 cm (10")    From Standing Position, Pick up Object from Floor  Able to pick up shoe safely and easily    From Standing Position, Turn to Look Behind Over each Shoulder  Looks behind from both sides and weight shifts well    Turn 360 Degrees  Able to turn 360 degrees safely in 4 seconds or less    Standing Unsupported, Alternately Place Feet on Step/Stool  Able to stand independently and safely and complete 8 steps in 20 seconds    Standing Unsupported, One Foot in Front  Able to place foot tandem independently and hold 30 seconds    Standing on One Leg  Able to lift leg independently and hold > 10 seconds    Total Score  56    Berg comment:  Initial Berg Balance 42/56  PT Education - 11/08/17 0940    Education Details  reviewed risk factors and signs/symptoms CVA,  visual imagery to reduce stress,     Person(s) Educated  Patient    Methods  Explanation;Verbal cues    Comprehension  Verbalized understanding          PT Long Term Goals - 11/08/17 1426      PT LONG TERM GOAL #1   Title  Patient demonstrates & verbalizes understanding of ongoing fitness plan /  HEP. (All LTGs Target  Date: 11/10/2017)    Time  4    Period  Weeks    Status  On-going    Target Date  11/10/17      PT LONG TERM GOAL #2   Title  Patient verbalizes understanding of CVA risk factors & signs/symptoms.    Baseline  MET 11/08/2017    Time  4    Period  Weeks    Status  Achieved      PT LONG TERM GOAL #3   Title  Berg Balance >52/56 to indicate lower fall risk    Baseline  MET 11/08/2017  [name] 56/56    Time  4    Period  Weeks    Status  Achieved      PT LONG TERM GOAL #4   Title  Functional Gait Assessment >/= 24/30    Time  4    Period  Weeks    Status  On-going    Target Date  11/10/17      PT  LONG TERM GOAL #5   Title  6 Minute Walk Test without assistive device >1400' safely    Time  4    Period  Weeks    Status  On-going    Target Date  11/10/17      PT LONG TERM GOAL #6   Title  Patient demonstrates work simulation tasks safely.     Time  4    Period  Weeks    Status  On-going    Target Date  11/10/17            Plan - 11/08/17 1427    Clinical Impression Statement  Patient's blood pressure was too high to participate in any moderate to extreme activities. He seems to understand use of visual imagery to assist somewhat with BP. He understands PT recommendation to see MD regarding BP control. He may need date extension for PT to meet work simulation goal.     Rehab Potential  Good    Clinical Impairments Affecting Rehab Potential  LLE weakness, high fall risk, significant other has noticed impaired processing verbal instructions    PT Frequency  2x / week    PT Duration  4 weeks    PT Treatment/Interventions  ADLs/Self Care Home Management;Canalith Repostioning;DME Instruction;Gait training;Stair training;Functional mobility training;Therapeutic activities;Therapeutic exercise;Balance training;Neuromuscular re-education;Patient/family education;Vestibular    PT Next Visit Plan  assess remaining LTGs if BP is safe range    Consulted and Agree with Plan of Care  Patient       Patient will benefit from skilled therapeutic intervention in order to improve the following deficits and impairments:  Abnormal gait, Decreased activity tolerance, Decreased balance, Decreased endurance, Decreased coordination, Decreased mobility, Decreased strength, Dizziness, Postural dysfunction  Visit Diagnosis: Hemiplegia and hemiparesis following cerebral infarction affecting left non-dominant side (HCC)  Unsteadiness on feet  Muscle weakness (generalized)  Abnormal posture  Other abnormalities of gait and mobility     Problem  List Patient Active Problem List   Diagnosis Date  Noted  . Hyperglycemia 10/17/2017  . Acute cerebral infarction ([place]) 09/30/2017  . OSA (obstructive sleep apnea) 09/30/2017  . Smoker 09/30/2017  . 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 Health care maintenance 06/20/2013  . Severe obesity (BMI 35.0-39.9) with comorbidity ([place]) 12/15/2011  . Skin rash 07/07/2011  . HTN (hypertension)     Santina Trillo PT, DPT 11/08/2017, 2:44 PM  [place] [address] [place], [place], [address] Phone: [phone]   Fax:  [phone]  Name: Thomas Howard MRN: [id number] Date of Birth: 08-Jun-1960

## 2017-11-10 ENCOUNTER — Encounter: Payer: Self-pay | Admitting: Occupational Therapy

## 2017-11-10 ENCOUNTER — Encounter: Payer: Self-pay | Admitting: Physical Therapy

## 2017-11-10 ENCOUNTER — Ambulatory Visit: Payer: BLUE CROSS/BLUE SHIELD | Admitting: Physical Therapy

## 2017-11-10 ENCOUNTER — Ambulatory Visit: Payer: BLUE CROSS/BLUE SHIELD | Admitting: Occupational Therapy

## 2017-11-10 VITALS — BP 138/77

## 2017-11-10 DIAGNOSIS — R2689 Other abnormalities of gait and mobility: Secondary | ICD-10-CM | POA: Diagnosis not present

## 2017-11-10 DIAGNOSIS — R2681 Unsteadiness on feet: Secondary | ICD-10-CM | POA: Diagnosis not present

## 2017-11-10 DIAGNOSIS — R293 Abnormal posture: Secondary | ICD-10-CM

## 2017-11-10 DIAGNOSIS — R471 Dysarthria and anarthria: Secondary | ICD-10-CM | POA: Diagnosis not present

## 2017-11-10 DIAGNOSIS — R278 Other lack of coordination: Secondary | ICD-10-CM

## 2017-11-10 DIAGNOSIS — I69354 Hemiplegia and hemiparesis following cerebral infarction affecting left non-dominant side: Secondary | ICD-10-CM | POA: Diagnosis not present

## 2017-11-10 DIAGNOSIS — R261 Paralytic gait: Secondary | ICD-10-CM | POA: Diagnosis not present

## 2017-11-10 DIAGNOSIS — I69315 Cognitive social or emotional deficit following cerebral infarction: Secondary | ICD-10-CM | POA: Diagnosis not present

## 2017-11-10 DIAGNOSIS — M6281 Muscle weakness (generalized): Secondary | ICD-10-CM | POA: Diagnosis not present

## 2017-11-10 DIAGNOSIS — R41842 Visuospatial deficit: Secondary | ICD-10-CM

## 2017-11-10 DIAGNOSIS — Z9181 History of falling: Secondary | ICD-10-CM

## 2017-11-10 DIAGNOSIS — R41841 Cognitive communication deficit: Secondary | ICD-10-CM | POA: Diagnosis not present

## 2017-11-10 NOTE — Therapy (Addendum)
[facility] [address] [place], [place], [address] Phone: [phone]   Fax:  [phone]  Occupational Therapy Treatment  Patient Details  Name: Thomas Howard MRN: [id number] Date of Birth: 02/12/1961 Referring Provider: Jerrye Bushy, MD   Encounter Date: 11/10/2017  OT End of Session - 11/10/17 1737    Visit Number  5    Number of Visits  25    Date for OT Re-Evaluation  01/13/18    Authorization Type  BCBS - 60 visits combined for OT/PT, if occurs same day counts as one visit    Authorization - Visit Number  5    Authorization - Number of Visits  30    OT Start Time  0850    OT Stop Time  0930    OT Time Calculation (min)  40 min       Past Medical History:  Diagnosis Date  . History of smoking   . HTN (hypertension)   . Obesity   . Wears partial dentures     Past Surgical History:  Procedure Laterality Date  . MOUTH SURGERY    . WISDOM TOOTH EXTRACTION      Vitals:   11/10/17 0854  BP: 138/77    Subjective Assessment - 11/10/17 0922    Pertinent History  hospitalized 09/22/17-09/25/17 at [facility] with acute nonhemorrhagic right basal ganglia lacunar infarct and HTN.    Patient Stated Goals  regain use of LUE    Currently in Pain?  No/denies                   Treatment: see education section, pt was instructed in HEP, strategies for donning doffing shoes        OT Education - 11/10/17 [phone]    Education Details  importance of performing self care to be more independent, donning/ doffing shoe, red theraband HEP 15 reps bilateral UE's min v.c/ demonstration   Person(s) Educated  Patient    Methods  Explanation;Demonstration;Handout    Comprehension  Verbalized understanding       OT Short Term Goals - 11/08/17 0859      OT SHORT TERM GOAL #1   Title  I with inital HEP. due 11/29/17    Status  On-going      OT SHORT TERM GOAL #2   Title  Pt will verbalize  understanding of compensatory strategies for visual perceptual  impairment    Status  On-going      OT SHORT TERM GOAL #3   Title  Pt will perform all basic ADLS with modified indpendently with no more than 2 rest breaks    Status  On-going      OT SHORT TERM GOAL #4   Title  Pt will perform tabletop scanning activities with 80% or better accuracy for improved safety during ADLs.    Status  On-going      OT SHORT TERM GOAL #5   Title  Pt will perfrom basic environmental scanning with 80% or better accuracy in prep for driving.    Status  On-going      OT SHORT TERM GOAL #6   Title  Pt will demonstrate ability to retrieve a lightweight object at 130* shoulder flexion without dropping with LUE     Status  On-going      OT SHORT TERM GOAL #7   Title  further assess cognitve skills and set additional goal PRN    Status  On-going  OT Long Term Goals - 11/08/17 [id number]      OT LONG TERM GOAL #1   Title  I with updated HEP.    Time  12    Period  Weeks    Status  On-going      OT LONG TERM GOAL #2   Title  Pt will demonstrate improved fine motor coordination  for ADLs as evidenced by decreasing 9 hole peg test score to 38 secs or less.    Baseline  RUE 26.94 sec, LUE 44.97 secs    Time  12    Period  Weeks    Status  On-going      OT LONG TERM GOAL #3   Title  Pt will perform simple cooking task modified indpendently demonstrating good safety awareness.    Time  12    Period  Weeks    Status  On-going      OT LONG TERM GOAL #4   Title  Pt will perfrom tabletop and environmental scanning activities with 90% or better accurary in prep for ADLs and work activities.    Time  12    Period  Weeks    Status  On-going      OT LONG TERM GOAL #5   Title  Pt will demonstrate adequate LUE strength and control to retrieve a 5 lbs weight from mid level shelf x 5 reps without drops.    Time  12    Period  Weeks    Status  On-going            Plan - 11/10/17 1737     Clinical Impression Statement  Pt progressing toward goals. Pt demonstrates improving strength and endurance.    Occupational Profile and client history currently impacting functional performance  Prior to CVA, pt was completely I, working full time and driving. Pt currently is unable to work, drive and he requires assist with ADLs/IADLs PMH: HTN, smoker    Occupational performance deficits (Please refer to evaluation for details):  ADL's;IADL's;Work;Leisure;Social Participation    Rehab Potential  Good    Current Impairments/barriers affecting progress:  severity of deficits    OT Frequency  2x / week    OT Duration  12 weeks    OT Treatment/Interventions  Self-care/ADL training;Electrical Stimulation;Therapeutic exercise;Visual/perceptual remediation/compensation;Coping strategies training;Patient/family education;Splinting;Neuromuscular education;Paraffin;Moist Heat;Fluidtherapy;Energy conservation;Therapeutic activities;Cognitive remediation/compensation;Balance training;Passive range of motion;Manual Therapy;DME and/or AE instruction;Contrast Bath;Ultrasound;Cryotherapy    Plan  green putty HEP, environmental scanning/ visual perceptual skills, memory (retrieval), coordination,     Consulted and Agree with Plan of Care  Patient       Patient will benefit from skilled therapeutic intervention in order to improve the following deficits and impairments:  Abnormal gait, Decreased cognition, Decreased knowledge of use of DME, Impaired flexibility, Impaired vision/preception, Decreased mobility, Decreased coordination, Decreased activity tolerance, Decreased endurance, Decreased range of motion, Decreased strength, Impaired UE functional use, Impaired perceived functional ability, Difficulty walking, Decreased safety awareness, Decreased knowledge of precautions, Decreased balance  Visit Diagnosis: Muscle weakness (generalized)  Visuospatial deficit  Cognitive social or emotional deficit following  cerebral infarction  Other lack of coordination    Problem List Patient Active Problem List   Diagnosis Date Noted  . Hyperglycemia 10/17/2017  . Acute cerebral infarction (HCC) 09/30/2017  . OSA (obstructive sleep apnea) 09/30/2017  . Smoker 09/30/2017  . 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 Health care maintenance 06/20/2013  . Severe obesity (BMI 35.0-39.9) with comorbidity (HCC) 12/15/2011  .  Skin rash 07/07/2011  . HTN (hypertension)     RINE,KATHRYN 11/10/2017, 5:39 PM  [facility] [address] [place], [place], [address] Phone: [phone]   Fax:  [phone]  Name: Thomas Howard MRN: [id number] Date of Birth: [birth year]

## 2017-11-10 NOTE — Patient Instructions (Signed)
  Strengthening: Resisted Flexion   Hold tubing with __each___ arm(s) at side. Pull forward and up. Move shoulder through pain-free range of motion. Repeat __15__ times per set.  Do _1-2_ sessions per day , every other day   Strengthening: Resisted Extension   Hold tubing in ___each__ hand(s), arm forward. Pull arm back, elbow straight. Repeat _15___ times per set. Do _1-2___ sessions per day, every other day.   Resisted Horizontal Abduction: Bilateral   Sit or stand, tubing in both hands, arms out in front. Keeping arms straight, pinch shoulder blades together and stretch arms out. Repeat _15__ times per set. Do _1-2___ sessions per day, every other day.   Elbow Flexion: Resisted   With tubing held in ___each__ hand(s) and other end secured under foot, curl arm up as far as possible. Repeat _10___ times per set. Do _1-2___ sessions per day, every other day.    Elbow Extension: Resisted   Sit in chair with resistive band secured at armrest (or hold with other hand) and __each_____ elbow bent. Straighten elbow. Repeat _15__ times per set.  Do _1-2___ sessions per day, every other day.   Copyright  V[employer]. All rights reserved.

## 2017-11-11 NOTE — Therapy (Signed)
[facility] [address] [place], [place], [address] Phone: [phone]   Fax:  [phone]  Physical Therapy Treatment  Patient Details  Name: Thomas Howard MRN: [id number] Date of Birth: 07/05/1960  Ordering physician: Ria Bush, MD  Encounter Date: 11/10/2017  PT End of Session - 11/10/17 1841    Visit Number  8    Number of Visits  26    Date for PT Re-Evaluation  01/14/18    Authorization Type  [employer] 60 visit limit combined PT & OT, counts as 1 visit if seen on same day.  $20 co-pay    Authorization - Visit Number  10 counting PT, OT & speech, counts as 1 if seen on same day    Authorization - Number of Visits  60    PT Start Time  0930    PT Stop Time  1015    PT Time Calculation (min)  45 min    Equipment Utilized During Treatment  Gait belt    Activity Tolerance  Patient tolerated treatment well    Behavior During Therapy  [facility] for tasks assessed/performed       Past Medical History:  Diagnosis Date  . History of smoking   . HTN (hypertension)   . Obesity   . Wears partial dentures     Past Surgical History:  Procedure Laterality Date  . MOUTH SURGERY    . WISDOM TOOTH EXTRACTION      There were no vitals filed for this visit.  Subjective Assessment - 11/10/17 0930    Subjective  He continues to fatigue easily. He is going on cruise to [place] in 2 weeks. BP was good prior to OT this morning.     Pertinent History  HTN, + smoker,    Limitations  Lifting;Standing;Walking;House hold activities    Patient Stated Goals  To not be so fatigued with every day activities.     Currently in Pain?  No/denies         [facility] PT Assessment - 11/10/17 0930      Ambulation/Gait   Ambulation/Gait  Yes    Ambulation/Gait Assistance  5: Supervision unsteadiness & occassional left side neglect of environment    Ambulation/Gait Assistance Details  Patient unaware of his location to edge of sidewalk which  risks stepping off & loosing balance    Ambulation Distance (Feet)  1605 Feet    Assistive device  None    Ambulation Surface  Indoor;Level;Outdoor;Paved    Gait velocity  3.93 ft/sec Initial was 2.98 ft/sec    Stairs  Yes    Stairs Assistance  5: Supervision    Stair Management Technique  No rails;Alternating pattern;Forwards    Number of Stairs  4    Curb  5: Supervision no device      6 Minute Walk- Baseline   6 Minute Walk- Baseline  yes    BP (mmHg)  161/89    HR (bpm)  83    02 Sat (%RA)  95 %    Modified Borg Scale for Dyspnea  0- Nothing at all      6 Minute walk- Post Test   6 Minute Walk Post Test  yes    BP (mmHg)  129/79    HR (bpm)  97    02 Sat (%RA)  97 %    Modified Borg Scale for Dyspnea  2- Mild shortness of breath    Perceived Rate of Exertion (Borg)  13- Somewhat  hard      6 minute walk test results    Aerobic Endurance Distance Walked  1605      Functional Gait  Assessment   Gait assessed   Yes    Gait Level Surface  Walks 20 ft in less than 5.5 sec, no assistive devices, good speed, no evidence for imbalance, normal gait pattern, deviates no more than 6 in outside of the 12 in walkway width.    Change in Gait Speed  Able to smoothly change walking speed without loss of balance or gait deviation. Deviate no more than 6 in outside of the 12 in walkway width.    Gait with Horizontal Head Turns  Performs head turns smoothly with slight change in gait velocity (eg, minor disruption to smooth gait path), deviates 6-10 in outside 12 in walkway width, or uses an assistive device.    Gait with Vertical Head Turns  Performs task with slight change in gait velocity (eg, minor disruption to smooth gait path), deviates 6 - 10 in outside 12 in walkway width or uses assistive device    Gait and Pivot Turn  Pivot turns safely within 3 sec and stops quickly with no loss of balance.    Step Over Obstacle  Is able to step over one shoe box (4.5 in total height) without changing  gait speed. No evidence of imbalance.    Gait with Narrow Base of Support  Ambulates 7-9 steps.    Gait with Eyes Closed  Walks 20 ft, uses assistive device, slower speed, mild gait deviations, deviates 6-10 in outside 12 in walkway width. Ambulates 20 ft in less than 9 sec but greater than 7 sec.    Ambulating Backwards  Walks 20 ft, uses assistive device, slower speed, mild gait deviations, deviates 6-10 in outside 12 in walkway width.    Steps  Alternating feet, no rail.    Total Score  24    FGA comment:  Initial FGA 14/30      Work simulation tasks / assessment: Patient lifted 25# box 2 reps, then 3 reps with carrying it 64' with PT demo, verbal & tactile cues on proper mechanics. Pt required 3 min seated rest.  Pt lifted from floor & turned 90* to place crate on 18" mat: 35#, 40#, 45#, 50#, 55# 1 rep each with verbal & tactile cues.  Pt required 3 min seated after these 5 lifts.  Used dynameter against horizontal bar to measure push & pull power: push 95#, 95#, 105# for 98.33# average;  Pull 105#, 105#, 125# for 111.67# average                         PT Short Term Goals - 11/10/17 1055      PT SHORT TERM GOAL #1   Title  Patient verbalizes & demonstrates exercise program to safely perform at local fitness including if BP in safe range. (All STGs Target Date: 12/17/2017)    Time  4    Period  Weeks    Status  New    Target Date  12/17/17      PT SHORT TERM GOAL #2   Title  Patient tolerates 30 minutes of moderate intensity activities with </= 2 seated rests for <3 minutes.     Time  4    Period  Weeks    Status  New    Target Date  12/17/17      PT SHORT TERM  GOAL #3   Title  Patient lifts 35# box & carries 52' for 10 reps with cues on body mechanics only.     Time  4    Period  Weeks    Status  New    Target Date  12/17/17      PT SHORT TERM GOAL #4   Title  Patient pushes/pulls weight cart 50' 20 reps with verbal cues on technique / body mechanics.      Time  4    Period  Weeks    Status  New    Target Date  12/17/17        PT Long Term Goals - 11/10/17 1044      PT LONG TERM GOAL #1   Title  Patient demonstrates & verbalizes understanding of ongoing fitness plan /  HEP. (All LTGs Target  Date: 11/10/2017)    Baseline  NOT MET but progressing 11/10/2017  Patient is independent with basic HEP but requires skilled instruction to progress to level that would enable safe return to work    Time  4    Period  Weeks    Status  Not Met      PT LONG TERM GOAL #2   Title  Patient verbalizes understanding of CVA risk factors & signs/symptoms.    Baseline  MET 11/08/2017    Time  4    Period  Weeks    Status  Achieved      PT LONG TERM GOAL #3   Title  Berg Balance >52/56 to indicate lower fall risk    Baseline  MET 11/08/2017  [name] 56/56    Time  4    Period  Weeks    Status  Achieved      PT LONG TERM GOAL #4   Title  Functional Gait Assessment >/= 24/30    Baseline  MET 11/10/2017  FGA 24/30    Time  4    Period  Weeks    Status  Achieved      PT LONG TERM GOAL #5   Title  6 Minute Walk Test without assistive device >1400' safely    Baseline  Partially MET 11/10/2017 6MWT 1605' but supervision due to left side neglect of environment    Time  4    Period  Weeks    Status  Partially Met      PT LONG TERM GOAL #6   Title  Patient demonstrates work simulation tasks safely.     Baseline  NOT MET 11/10/2017  See PT note for 11/10/2017    Time  4    Period  Weeks    Status  Not Met        PT Long Term Goals - 11/11/17 1111      PT LONG TERM GOAL #1   Title  Patient demonstrates & verbalizes understanding of ongoing fitness plan /  HEP. (All updated LTGs Target  Date: 01/14/2018)    Time  8    Period  Weeks    Status  On-going    Target Date  01/14/18      PT LONG TERM GOAL #2   Title  Patient verbalizes safe BP for moderate to high intensity activites like fitness program or work.     Time  8    Period  Weeks    Status  New     Target Date  01/14/18      PT LONG TERM GOAL #3  Title  Patient tolerates 40 minutes of standing activities of moderate intensity without seated rests.    Time  8    Period  Weeks    Status  New    Target Date  01/14/18      PT LONG TERM GOAL #4   Title  Functional Gait Assessment >/= 28/30    Time  8    Period  Weeks    Status  Revised    Target Date  01/14/18      PT LONG TERM GOAL #5   Title  Patient lifts 100# crate floor to knee height surface 3 reps and lifts 55# / carries 50' 10 reps with proper body mechanics safely    Time  8    Period  Weeks    Status  New    Target Date  01/14/18      PT LONG TERM GOAL #6   Title  Patient pushes & pulls up to 125# average, pushes & pulls weighted cart 50' X 20 reps safely.     Time  8    Period  Weeks    Status  New    Target Date  01/14/18            Plan - 11/11/17 1047    Clinical Impression Statement  Patient met or partially met 4 of 6 LTGs set for initial plan of care. He needs additional skilled care to progress to level that he could safely return to work.  His BP continues to be a concern for moderate to heavy intensity activities like work tasks. Patient may need a Functional Work Evaluation at end of this plan of care as [facility] only sees him 1.5 hours for PT & OT and work is 8hrs/day.      Rehab Potential  Good    Clinical Impairments Affecting Rehab Potential  LLE weakness, high fall risk, significant other has noticed impaired processing verbal instructions    PT Frequency  2x / week    PT Duration  8 weeks    PT Treatment/Interventions  ADLs/Self Care Home Management;Canalith Repostioning;DME Instruction;Gait training;Stair training;Functional mobility training;Therapeutic activities;Therapeutic exercise;Balance training;Neuromuscular re-education;Patient/family education;Vestibular    PT Next Visit Plan  Check BP prior & during PT, lifting, carrying, pushing, pulling & gait /balance with increasing time  tolerates moderate activities    Consulted and Agree with Plan of Care  Patient       Patient will benefit from skilled therapeutic intervention in order to improve the following deficits and impairments:  Abnormal gait, Decreased activity tolerance, Decreased balance, Decreased endurance, Decreased coordination, Decreased mobility, Decreased strength, Dizziness, Postural dysfunction  Visit Diagnosis: Unsteadiness on feet  Muscle weakness (generalized)  Abnormal posture  Other abnormalities of gait and mobility  Other lack of coordination  Hemiplegia and hemiparesis following cerebral infarction affecting left non-dominant side (HCC)  History of falling  Paralytic gait     Problem List Patient Active Problem List   Diagnosis Date Noted  . Hyperglycemia 10/17/2017  . Acute cerebral infarction ([place]) 09/30/2017  . OSA (obstructive sleep apnea) 09/30/2017  . Smoker 09/30/2017  . 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 Health care maintenance 06/20/2013  . Severe obesity (BMI 35.0-39.9) with comorbidity ([place]) 12/15/2011  . Skin rash 07/07/2011  . HTN (hypertension)     Ivalene Platte PT, DPT 11/11/2017, 11:03 AM  [facility] [address] [place], [place], [address] Phone: [phone]  Fax:  [phone]  Name: Frenchie Dangerfield MRN: [id number] Date of Birth: 1960-12-16

## 2017-11-16 ENCOUNTER — Encounter: Payer: Self-pay | Admitting: Family Medicine

## 2017-11-16 ENCOUNTER — Encounter: Payer: Self-pay | Admitting: Occupational Therapy

## 2017-11-16 ENCOUNTER — Ambulatory Visit: Payer: BLUE CROSS/BLUE SHIELD | Admitting: Occupational Therapy

## 2017-11-16 ENCOUNTER — Ambulatory Visit: Payer: BLUE CROSS/BLUE SHIELD | Admitting: Family Medicine

## 2017-11-16 VITALS — BP 138/88

## 2017-11-16 VITALS — BP 160/80 | HR 84 | Temp 98.4°F | Ht 68.0 in | Wt 237.8 lb

## 2017-11-16 DIAGNOSIS — I10 Essential (primary) hypertension: Secondary | ICD-10-CM

## 2017-11-16 DIAGNOSIS — F17200 Nicotine dependence, unspecified, uncomplicated: Secondary | ICD-10-CM

## 2017-11-16 DIAGNOSIS — R278 Other lack of coordination: Secondary | ICD-10-CM

## 2017-11-16 DIAGNOSIS — R41841 Cognitive communication deficit: Secondary | ICD-10-CM | POA: Diagnosis not present

## 2017-11-16 DIAGNOSIS — R2689 Other abnormalities of gait and mobility: Secondary | ICD-10-CM | POA: Diagnosis not present

## 2017-11-16 DIAGNOSIS — M6281 Muscle weakness (generalized): Secondary | ICD-10-CM | POA: Diagnosis not present

## 2017-11-16 DIAGNOSIS — R42 Dizziness and giddiness: Secondary | ICD-10-CM

## 2017-11-16 DIAGNOSIS — I69315 Cognitive social or emotional deficit following cerebral infarction: Secondary | ICD-10-CM

## 2017-11-16 DIAGNOSIS — E1165 Type 2 diabetes mellitus with hyperglycemia: Secondary | ICD-10-CM

## 2017-11-16 DIAGNOSIS — R41842 Visuospatial deficit: Secondary | ICD-10-CM | POA: Diagnosis not present

## 2017-11-16 DIAGNOSIS — Z9181 History of falling: Secondary | ICD-10-CM | POA: Diagnosis not present

## 2017-11-16 DIAGNOSIS — IMO0002 Reserved for concepts with insufficient information to code with codable children: Secondary | ICD-10-CM

## 2017-11-16 DIAGNOSIS — R261 Paralytic gait: Secondary | ICD-10-CM | POA: Diagnosis not present

## 2017-11-16 DIAGNOSIS — I6930 Unspecified sequelae of cerebral infarction: Secondary | ICD-10-CM

## 2017-11-16 DIAGNOSIS — I69354 Hemiplegia and hemiparesis following cerebral infarction affecting left non-dominant side: Secondary | ICD-10-CM | POA: Diagnosis not present

## 2017-11-16 DIAGNOSIS — R2681 Unsteadiness on feet: Secondary | ICD-10-CM | POA: Diagnosis not present

## 2017-11-16 DIAGNOSIS — R293 Abnormal posture: Secondary | ICD-10-CM | POA: Diagnosis not present

## 2017-11-16 DIAGNOSIS — E1149 Type 2 diabetes mellitus with other diabetic neurological complication: Secondary | ICD-10-CM | POA: Diagnosis not present

## 2017-11-16 DIAGNOSIS — I6612 Occlusion and stenosis of left anterior cerebral artery: Secondary | ICD-10-CM | POA: Diagnosis not present

## 2017-11-16 DIAGNOSIS — R471 Dysarthria and anarthria: Secondary | ICD-10-CM | POA: Diagnosis not present

## 2017-11-16 NOTE — Patient Instructions (Addendum)
See [name] to extend therapy sessions.  Increase amlodipine to 10mg  daily - take 2 5mg  tablets at a time.  Continue other medicines. Continue working on fully quitting smoking - congratulations on progress to date!

## 2017-11-16 NOTE — Progress Notes (Addendum)
BP (!) 160/80 (BP Location: Right Arm, Cuff Size: Large)   Pulse 84   Temp 98.4 F (36.9 C) (Oral)   Ht 5' 8"  (1.727 m)   Wt 237 lb 12 oz (107.8 kg)   SpO2 97%   BMI 36.15 kg/m    CC: f/u visit after stroke Subjective:    Patient ID: Thomas Howard, male    DOB: 1960/11/11, 57 y.o.   MRN: [id number]  HPI: Nahzir Pohle is a 57 y.o. male presenting on 11/16/2017 for Follow-up (Here for f/u for PT/OT to continue.  Pt accompanied by his wife.)   Suffered 09/2017 acute nonhemorrhagic R basal ganglia lacunar infarct.   Sees PT/OT twice weekly. Latest PT/OT eval reviewed 11/10/2017 -  PT: has met 4/6 long term goals, needs additional skilled care to progress to level that he could safely return to work. Suggested possible need for Functional Work Evaluation at end of plan of care. Ongoing deficits including LLE weakness, high fall risk, impaired processing of verbal instructions.  OT: currently requires assistance with ADLs/IADLs.   Endorses 1-2 wk h/o dizziness first thing when he wakes up - this lasts 5-7 hours until 1pm.  Dizziness described as vertigo, no presyncope or syncope. Trouble sleeping - tossing and turning in his sleep. Symptoms can be triggered when turning over in bed.   Has f/u neurology appts 7/23 (OSA not on CPAP) and 7/24 (stroke).   Compliant with medications - amlodipine 69m, aspirin, plavix, lipitor 880m hctz 2553maily and lisinopril 22m53mily. BP at home running higher - 140-150/80-90.   Current smoker - continue decreasing use - down to <1/2 ppd.   Relevant past medical, surgical, family and social history reviewed and updated as indicated. Interim medical history since our last visit reviewed. Allergies and medications reviewed and updated. Outpatient Medications Prior to Visit  Medication Sig Dispense Refill  . amLODipine (NORVASC) 10 MG tablet Take 1 tablet (10 mg total) by mouth daily.    . asMarland Kitchenirin 81 MG EC tablet Take 81 mg by mouth daily.  0  .  atorvastatin (LIPITOR) 80 MG tablet Take 1 tablet (80 mg total) by mouth at bedtime. 90 tablet 3  . clopidogrel (PLAVIX) 75 MG tablet Take 1 tablet (75 mg total) by mouth daily. 90 tablet 3  . hydrochlorothiazide (HYDRODIURIL) 50 MG tablet Take 0.5 tablets (25 mg total) by mouth daily.    . liMarland Kitcheninopril (PRINIVIL,ZESTRIL) 40 MG tablet Take 1 tablet (40 mg total) by mouth daily. 90 tablet 3  . nicotine (NICODERM CQ - DOSED IN MG/24 HOURS) 21 mg/24hr patch Place 21 mg onto the skin daily.    . amMarland KitchenODipine (NORVASC) 5 MG tablet Take 1 tablet (5 mg total) by mouth daily. 90 tablet 1   No facility-administered medications prior to visit.      Per HPI unless specifically indicated in ROS section below Review of Systems     Objective:    BP (!) 160/80 (BP Location: Right Arm, Cuff Size: Large)   Pulse 84   Temp 98.4 F (36.9 C) (Oral)   Ht 5' 8"  (1.727 m)   Wt 237 lb 12 oz (107.8 kg)   SpO2 97%   BMI 36.15 kg/m   Wt Readings from Last 3 Encounters:  11/16/17 237 lb 12 oz (107.8 kg)  10/14/17 235 lb 4 oz (106.7 kg)  09/30/17 236 lb 8 oz (107.3 kg)    Physical Exam  Constitutional: He appears well-developed and well-nourished. No distress.  HENT:  Mouth/Throat: Oropharynx is clear and moist. No oropharyngeal exudate.  Cardiovascular: Normal rate, regular rhythm and normal heart sounds.  No murmur heard. Pulmonary/Chest: Effort normal and breath sounds normal. No respiratory distress. He has no wheezes. He has no rales.  Musculoskeletal: He exhibits no edema.  Neurological: He is alert. No cranial nerve deficit or sensory deficit. He displays a negative Romberg sign. Coordination (unsteady on feet) abnormal.  CN 2-12 intact FTN intact Neg dix hallpike bilaterally Decreased strength LUE,LLE  Skin: Skin is warm.  Psychiatric: He has a normal mood and affect.  Nursing note and vitals reviewed.  Results for orders placed or performed in visit on 62/94/76  Basic metabolic panel  Result  Value Ref Range   Sodium 134 (L) 135 - 145 mEq/L   Potassium 3.3 (L) 3.5 - 5.1 mEq/L   Chloride 98 96 - 112 mEq/L   CO2 28 19 - 32 mEq/L   Glucose, Bld 288 (H) 70 - 99 mg/dL   BUN 23 6 - 23 mg/dL   Creatinine, Ser 1.04 0.40 - 1.50 mg/dL   Calcium 9.9 8.4 - 10.5 mg/dL   GFR 95.44 >60.00 mL/min  Hemoglobin A1c  Result Value Ref Range   Hgb A1c MFr Bld 9.9 (H) 4.6 - 6.5 %      Assessment & Plan:   Problem List Items Addressed This Visit    Vertigo    Not consistent with BPPV - neg dix hallpike. Strange temporal nature of symptoms - predominate in the morning associated with marked fatigue, then resolve later in the day. Main concern is CVA etiology. Will start by working towards better hypertension control, if persistent vertigo, low threshold to reimage. Has f/u neuro appt scheduled later this month.       Uncontrolled type 2 diabetes mellitus with neurologic complication, without long-term current use of insulin ([place])    New diagnosis last month. Did not review this today. Will discuss next month.       Smoker    Continues decreasing tobacco use - congratulated on this.       Severe obesity (BMI 35.0-39.9) with comorbidity ([place])    Activity limited by neurological deficits, continues working with PT.      Occlusion and stenosis of left anterior cerebral artery    Continue aspirin, plavix, statin.       Relevant Medications   amLODipine (NORVASC) 10 MG tablet   HTN (hypertension) - Primary    Persistently elevated readings despite current regimen - will increase amlodipine to 34m daily, continue lisinopril 416mand hczt 2586mReassess at 1 mo f/u visit.       Relevant Medications   amLODipine (NORVASC) 10 MG tablet   History of hemorrhagic cerebrovascular accident (CVA) with residual deficit    Ongoing neurological deficits, therapist recommend extending sessions - new referral placed.       Relevant Orders   Ambulatory referral to Physical Therapy   Ambulatory  referral to Occupational Therapy       No orders of the defined types were placed in this encounter.  Orders Placed This Encounter  Procedures  . Ambulatory referral to Physical Therapy    Referral Priority:   Routine    Referral Type:   Physical Medicine    Referral Reason:   Specialty Services Required    Requested Specialty:   Physical Therapy    Number of Visits Requested:   1  . Ambulatory referral to Occupational Therapy    Referral Priority:  Routine    Referral Type:   Occupational Therapy    Referral Reason:   Specialty Services Required    Requested Specialty:   Occupational Therapy    Number of Visits Requested:   1    Follow up plan: Return in about 1 month (around 12/14/2017) for follow up visit.  Ria Bush, MD

## 2017-11-17 NOTE — Therapy (Signed)
[facility] [address] [place], [place], [address] Phone: [phone]   Fax:  [phone]  Occupational Therapy Treatment  Patient Details  Name: Thomas Howard MRN: [id number] Date of Birth: November 30, 1960 Referring Provider: Eustaquio Boyden, MD   Encounter Date: 11/16/2017  OT End of Session - 11/17/17 1309    Visit Number  6    Number of Visits  25    Date for OT Re-Evaluation  01/13/18    Authorization Type  [employer] - 60 visits combined for OT/PT, if occurs same day counts as one visit    Authorization - Visit Number  6    Authorization - Number of Visits  30    OT Start Time  0850    OT Stop Time  0930    OT Time Calculation (min)  40 min    Activity Tolerance  Patient tolerated treatment well    Behavior During Therapy  [facility] for tasks assessed/performed       Past Medical History:  Diagnosis Date  . History of smoking   . HTN (hypertension)   . Obesity   . Wears partial dentures     Past Surgical History:  Procedure Laterality Date  . MOUTH SURGERY    . WISDOM TOOTH EXTRACTION      Vitals:   11/16/17 0902  BP: 138/88    Subjective Assessment - 11/16/17 0926    Subjective   Pt reports sleeping alot at home    Pertinent History  hospitalized 09/22/17-09/25/17 at [facility] with acute nonhemorrhagic right basal ganglia lacunar infarct and HTN.    Patient Stated Goals  regain use of LUE    Currently in Pain?  No/denies               Treatment: tabletop activities for visual perceptual skills and visual memory, min v.c Environmental scanning : 53% accuracy on first pass. Pt barely looked to the left side. Pt located remaining items with  min v.c for more effective scanning.            OT Education - 11/17/17 1315    Education Details  compensatory strategies for visual perceptual deficits, pt's need for constant supervision while on cruise due to pt's cogntive/ visual perceptual  deficits.    Person(s) Educated  Patient    Methods  Explanation;Demonstration;Handout    Comprehension  Verbalized understanding       OT Short Term Goals - 11/16/17 0930      OT SHORT TERM GOAL #1   Title  I with inital HEP. due 11/29/17    Status  On-going      OT SHORT TERM GOAL #2   Title  Pt will verbalize understanding of compensatory strategies for visual perceptual  impairment    Status  On-going      OT SHORT TERM GOAL #3   Title  Pt will perform all basic ADLS with modified indpendently with no more than 2 rest breaks    Status  On-going      OT SHORT TERM GOAL #4   Title  Pt will perform tabletop scanning activities with 80% or better accuracy for improved safety during ADLs.    Status  On-going      OT SHORT TERM GOAL #5   Title  Pt will perfrom basic environmental scanning with 80% or better accuracy in prep for driving.    Status  On-going      OT SHORT TERM GOAL #6  Title  Pt will demonstrate ability to retrieve a lightweight object at 130* shoulder flexion without dropping with LUE     Status  On-going      OT SHORT TERM GOAL #7   Title  further assess cognitve skills and set additional goal PRN    Status  On-going        OT Long Term Goals - 11/08/17 0921      OT LONG TERM GOAL #1   Title  I with updated HEP.    Time  12    Period  Weeks    Status  On-going      OT LONG TERM GOAL #2   Title  Pt will demonstrate improved fine motor coordination  for ADLs as evidenced by decreasing 9 hole peg test score to 38 secs or less.    Baseline  RUE 26.94 sec, LUE 44.97 secs    Time  12    Period  Weeks    Status  On-going      OT LONG TERM GOAL #3   Title  Pt will perform simple cooking task modified indpendently demonstrating good safety awareness.    Time  12    Period  Weeks    Status  On-going      OT LONG TERM GOAL #4   Title  Pt will perfrom tabletop and environmental scanning activities with 90% or better accurary in prep for ADLs and work  activities.    Time  12    Period  Weeks    Status  On-going      OT LONG TERM GOAL #5   Title  Pt will demonstrate adequate LUE strength and control to retrieve a 5 lbs weight from mid level shelf x 5 reps without drops.    Time  12    Period  Weeks    Status  On-going            Plan - 11/17/17 1312    Clinical Impression Statement  Pt progressing slowly toward goals. Pt continues to demonstrate significant left neglect and decreased safety awareness. Pt is going on a cruise next week, therapist discussed pt's need for direct supervision on the boat for safety.    Occupational Profile and client history currently impacting functional performance  Prior to CVA, pt was completely I, working full time and driving. Pt currently is unable to work, drive and he requires assist with ADLs/IADLs PMH: HTN, smoker    Occupational performance deficits (Please refer to evaluation for details):  ADL's;IADL's;Work;Leisure;Social Participation    Rehab Potential  Good    Current Impairments/barriers affecting progress:  severity of deficits    OT Frequency  2x / week    OT Duration  12 weeks    OT Treatment/Interventions  Self-care/ADL training;Electrical Stimulation;Therapeutic exercise;Visual/perceptual remediation/compensation;Coping strategies training;Patient/family education;Splinting;Neuromuscular education;Paraffin;Moist Heat;Fluidtherapy;Energy conservation;Therapeutic activities;Cognitive remediation/compensation;Balance training;Passive range of motion;Manual Therapy;DME and/or AE instruction;Contrast Bath;Ultrasound;Cryotherapy    Plan  green putty HEP, environmental scanning/ visual perceptual skills, memory (retrieval), coordination,     Consulted and Agree with Plan of Care  Patient    Family Member Consulted  April, girlfriend       Patient will benefit from skilled therapeutic intervention in order to improve the following deficits and impairments:  Abnormal gait, Decreased  cognition, Decreased knowledge of use of DME, Impaired flexibility, Impaired vision/preception, Decreased mobility, Decreased coordination, Decreased activity tolerance, Decreased endurance, Decreased range of motion, Decreased strength, Impaired UE functional use, Impaired perceived functional ability,  Difficulty walking, Decreased safety awareness, Decreased knowledge of precautions, Decreased balance  Visit Diagnosis: Muscle weakness (generalized)  Visuospatial deficit  Cognitive social or emotional deficit following cerebral infarction  Other lack of coordination    Problem List Patient Active Problem List   Diagnosis Date Noted  . Hyperglycemia 10/17/2017  . Acute cerebral infarction (HCC) 09/30/2017  . OSA (obstructive sleep apnea) 09/30/2017  . Smoker 09/30/2017  . 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 Health care maintenance 06/20/2013  . Severe obesity (BMI 35.0-39.9) with comorbidity (HCC) 12/15/2011  . Skin rash 07/07/2011  . HTN (hypertension)     Shawnna Pancake 11/17/2017, 2:46 PM Keene Breath, OTR/L [phone] Phone: [phone] 3:00 PM 11/17/17 [facility] [address] [place], [place], [address] Phone: [phone]   Fax:  [phone]  Name: Thomas Howard MRN: [id number] Date of Birth: Oct 23, 1960

## 2017-11-18 ENCOUNTER — Encounter: Payer: Self-pay | Admitting: Occupational Therapy

## 2017-11-18 ENCOUNTER — Ambulatory Visit: Payer: BLUE CROSS/BLUE SHIELD | Admitting: Occupational Therapy

## 2017-11-18 DIAGNOSIS — I69315 Cognitive social or emotional deficit following cerebral infarction: Secondary | ICD-10-CM

## 2017-11-18 DIAGNOSIS — R41842 Visuospatial deficit: Secondary | ICD-10-CM | POA: Diagnosis not present

## 2017-11-18 DIAGNOSIS — R293 Abnormal posture: Secondary | ICD-10-CM

## 2017-11-18 DIAGNOSIS — Z9181 History of falling: Secondary | ICD-10-CM | POA: Diagnosis not present

## 2017-11-18 DIAGNOSIS — R41841 Cognitive communication deficit: Secondary | ICD-10-CM | POA: Diagnosis not present

## 2017-11-18 DIAGNOSIS — R278 Other lack of coordination: Secondary | ICD-10-CM | POA: Diagnosis not present

## 2017-11-18 DIAGNOSIS — R471 Dysarthria and anarthria: Secondary | ICD-10-CM | POA: Diagnosis not present

## 2017-11-18 DIAGNOSIS — M6281 Muscle weakness (generalized): Secondary | ICD-10-CM | POA: Diagnosis not present

## 2017-11-18 DIAGNOSIS — R2689 Other abnormalities of gait and mobility: Secondary | ICD-10-CM | POA: Diagnosis not present

## 2017-11-18 DIAGNOSIS — I69354 Hemiplegia and hemiparesis following cerebral infarction affecting left non-dominant side: Secondary | ICD-10-CM | POA: Diagnosis not present

## 2017-11-18 DIAGNOSIS — R2681 Unsteadiness on feet: Secondary | ICD-10-CM

## 2017-11-18 DIAGNOSIS — R261 Paralytic gait: Secondary | ICD-10-CM | POA: Diagnosis not present

## 2017-11-18 NOTE — Therapy (Signed)
[facility] [address] [place], [place], [address] Phone: [phone]   Fax:  [phone]  Occupational Therapy Treatment  Patient Details  Name: Thomas Howard MRN: [id number] Date of Birth: August 27, 1960 Referring Provider: Eustaquio Boyden, MD   Encounter Date: 11/18/2017  OT End of Session - 11/18/17 1040    Visit Number  7    Number of Visits  25    Date for OT Re-Evaluation  01/13/18    Authorization Type  [employer] - 60 visits combined for OT/PT, if occurs same day counts as one visit    Authorization - Visit Number  7    Authorization - Number of Visits  30    OT Start Time  0847    OT Stop Time  0930    OT Time Calculation (min)  43 min    Activity Tolerance  Patient tolerated treatment well       Past Medical History:  Diagnosis Date  . History of smoking   . HTN (hypertension)   . Obesity   . Wears partial dentures     Past Surgical History:  Procedure Laterality Date  . MOUTH SURGERY    . WISDOM TOOTH EXTRACTION      There were no vitals filed for this visit.  Subjective Assessment - 11/18/17 0852    Subjective   Wow I did really bad on that scanning thing last time - I missed every one on the left    Pertinent History  hospitalized 09/22/17-09/25/17 at [facility] with acute nonhemorrhagic right basal ganglia lacunar infarct and HTN.    Patient Stated Goals  regain use of LUE    Currently in Pain?  No/denies                   OT Treatments/Exercises (OP) - 11/18/17 0001      ADLs   Eating  Pt reports he is having difficulty cutting meat at table.  In watching pt attempt, feel this is due primarily to in hand manipulation skills. On confrontational testing, sensation intact however suspect difficulty in incorporating sensory input when using hand.  Issued coban to increase sensory input and use of mat under plate and pt with signficant improvement ("this feels normal").        Neurological Re-education Exercises   Other Exercises 1  Neuro re ed to upgrade HEP for LUE for overhead reach with strength. Pt issued red theraband to begin to work in sitting up against gravity. Pt able to monitor to prevent compensations - recommened that pt only work to 100* of flexion to avoid neck/upper trap strain.  Also strongly recommended that pt not do more than 10 reps x2 per day.  P verbalized understanding and understands this will be in addition to current HEP.  Pt today has 140* of shoulder flexion 3+/5 MMT. Also addressed in hand manipulation using Grooved Peg Board -pt with minimal difficulty  Board placed in L field to encourage scanning.              OT Education - 11/18/17 1037    Education Details  upgraded HEP for LUE for use of red theraband in sitting for shoulder flexion to 100*.    Person(s) Educated  Patient    Methods  Explanation;Demonstration    Comprehension  Verbalized understanding;Returned demonstration       OT Short Term Goals - 11/18/17 1038      OT SHORT TERM GOAL #1  Title  I with inital HEP. due 11/29/17    Status  On-going      OT SHORT TERM GOAL #2   Title  Pt will verbalize understanding of compensatory strategies for visual perceptual  impairment    Status  On-going      OT SHORT TERM GOAL #3   Title  Pt will perform all basic ADLS with modified indpendently with no more than 2 rest breaks    Status  On-going      OT SHORT TERM GOAL #4   Title  Pt will perform tabletop scanning activities with 80% or better accuracy for improved safety during ADLs.    Status  On-going      OT SHORT TERM GOAL #5   Title  Pt will perfrom basic environmental scanning with 80% or better accuracy in prep for driving.    Status  On-going      OT SHORT TERM GOAL #6   Title  Pt will demonstrate ability to retrieve a lightweight object at 130* shoulder flexion without dropping with LUE     Status  Achieved      OT SHORT TERM GOAL #7   Title  further  assess cognitve skills and set additional goal PRN    Status  On-going        OT Long Term Goals - 11/18/17 1038      OT LONG TERM GOAL #1   Title  I with updated HEP.    Time  12    Period  Weeks    Status  On-going      OT LONG TERM GOAL #2   Title  Pt will demonstrate improved fine motor coordination  for ADLs as evidenced by decreasing 9 hole peg test score to 38 secs or less.    Baseline  RUE 26.94 sec, LUE 44.97 secs    Time  12    Period  Weeks    Status  On-going      OT LONG TERM GOAL #3   Title  Pt will perform simple cooking task modified indpendently demonstrating good safety awareness.    Time  12    Period  Weeks    Status  On-going      OT LONG TERM GOAL #4   Title  Pt will perfrom tabletop and environmental scanning activities with 90% or better accurary in prep for ADLs and work activities.    Time  12    Period  Weeks    Status  On-going      OT LONG TERM GOAL #5   Title  Pt will demonstrate adequate LUE strength and control to retrieve a 5 lbs weight from mid level shelf x 5 reps without drops.    Time  12    Period  Weeks    Status  On-going            Plan - 11/18/17 1039    Clinical Impression Statement  Pt progressing toward UE goals with improved functional overhead reach and slowly improving functional use of L hand    Occupational Profile and client history currently impacting functional performance  Prior to CVA, pt was completely I, working full time and driving. Pt currently is unable to work, drive and he requires assist with ADLs/IADLs PMH: HTN, smoker    Occupational performance deficits (Please refer to evaluation for details):  ADL's;IADL's;Work;Leisure;Social Participation    Rehab Potential  Good    Current Impairments/barriers affecting progress:  severity of deficits    OT Frequency  2x / week    OT Duration  12 weeks    OT Treatment/Interventions  Self-care/ADL training;Electrical Stimulation;Therapeutic  exercise;Visual/perceptual remediation/compensation;Coping strategies training;Patient/family education;Splinting;Neuromuscular education;Paraffin;Moist Heat;Fluidtherapy;Energy conservation;Therapeutic activities;Cognitive remediation/compensation;Balance training;Passive range of motion;Manual Therapy;DME and/or AE instruction;Contrast Bath;Ultrasound;Cryotherapy    Plan  green putty HEP, environmental scanning/ visual perceptual skills, memory (retrieval), coordination, NMR for strength with overhead reach    Consulted and Agree with Plan of Care  Patient       Patient will benefit from skilled therapeutic intervention in order to improve the following deficits and impairments:  Abnormal gait, Decreased cognition, Decreased knowledge of use of DME, Impaired flexibility, Impaired vision/preception, Decreased mobility, Decreased coordination, Decreased activity tolerance, Decreased endurance, Decreased range of motion, Decreased strength, Impaired UE functional use, Impaired perceived functional ability, Difficulty walking, Decreased safety awareness, Decreased knowledge of precautions, Decreased balance  Visit Diagnosis: Muscle weakness (generalized)  Visuospatial deficit  Cognitive social or emotional deficit following cerebral infarction  Other lack of coordination  Unsteadiness on feet  Abnormal posture  Hemiplegia and hemiparesis following cerebral infarction affecting left non-dominant side [facility])    Problem List Patient Active Problem List   Diagnosis Date Noted  . Hyperglycemia 10/17/2017  . Acute cerebral infarction (HCC) 09/30/2017  . OSA (obstructive sleep apnea) 09/30/2017  . Smoker 09/30/2017  . 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 Health care maintenance 06/20/2013  . Severe obesity (BMI 35.0-39.9) with comorbidity (HCC) 12/15/2011  . Skin rash 07/07/2011  . HTN (hypertension)     Norton Pastel, OTR/L 11/18/2017, 10:41  AM  [facility] [address] [place], [place], [address] Phone: [phone]   Fax:  [phone]  Name: Thomas Howard MRN: [id number] Date of Birth: [birth year]

## 2017-11-19 ENCOUNTER — Ambulatory Visit: Payer: BLUE CROSS/BLUE SHIELD | Admitting: Speech Pathology

## 2017-11-19 DIAGNOSIS — R2681 Unsteadiness on feet: Secondary | ICD-10-CM | POA: Diagnosis not present

## 2017-11-19 DIAGNOSIS — R2689 Other abnormalities of gait and mobility: Secondary | ICD-10-CM | POA: Diagnosis not present

## 2017-11-19 DIAGNOSIS — M6281 Muscle weakness (generalized): Secondary | ICD-10-CM | POA: Diagnosis not present

## 2017-11-19 DIAGNOSIS — R41841 Cognitive communication deficit: Secondary | ICD-10-CM | POA: Diagnosis not present

## 2017-11-19 DIAGNOSIS — I69354 Hemiplegia and hemiparesis following cerebral infarction affecting left non-dominant side: Secondary | ICD-10-CM | POA: Diagnosis not present

## 2017-11-19 DIAGNOSIS — R278 Other lack of coordination: Secondary | ICD-10-CM | POA: Diagnosis not present

## 2017-11-19 DIAGNOSIS — R471 Dysarthria and anarthria: Secondary | ICD-10-CM

## 2017-11-19 DIAGNOSIS — R293 Abnormal posture: Secondary | ICD-10-CM | POA: Diagnosis not present

## 2017-11-19 DIAGNOSIS — R261 Paralytic gait: Secondary | ICD-10-CM | POA: Diagnosis not present

## 2017-11-19 DIAGNOSIS — Z9181 History of falling: Secondary | ICD-10-CM | POA: Diagnosis not present

## 2017-11-19 DIAGNOSIS — I69315 Cognitive social or emotional deficit following cerebral infarction: Secondary | ICD-10-CM | POA: Diagnosis not present

## 2017-11-19 DIAGNOSIS — R41842 Visuospatial deficit: Secondary | ICD-10-CM | POA: Diagnosis not present

## 2017-11-19 NOTE — Therapy (Signed)
[facility] [address] [place], [place], [address] Phone: [phone]   Fax:  [phone]  Speech Language Pathology Treatment  Patient Details  Name: Thomas Howard MRN: [id number] Date of Birth: Oct 11, 1960 Referring Provider: Eustaquio Boyden, MD   Encounter Date: 11/19/2017  End of Session - 11/19/17 1016    Visit Number  3    Number of Visits  17    Date for SLP Re-Evaluation  12/31/17    SLP Start Time  0933    SLP Stop Time   1016    SLP Time Calculation (min)  43 min    Activity Tolerance  Patient tolerated treatment well       Past Medical History:  Diagnosis Date  . History of smoking   . HTN (hypertension)   . Obesity   . Wears partial dentures     Past Surgical History:  Procedure Laterality Date  . MOUTH SURGERY    . WISDOM TOOTH EXTRACTION      There were no vitals filed for this visit.  Subjective Assessment - 11/19/17 0935    Subjective  Pt did not recall previous ST appointment    Currently in Pain?  No/denies            ADULT SLP TREATMENT - 11/19/17 0933      General Information   Behavior/Cognition  Alert;Cooperative;Pleasant mood;Confused    Patient Positioning  Upright in chair      Treatment Provided   Treatment provided  Cognitive-Linquistic      Pain Assessment   Pain Assessment  No/denies pain      Cognitive-Linquistic Treatment   Treatment focused on  Dysarthria    Skilled Treatment  (Speech tx, 29 min) Pt reports he has not been completing HEP, questioning rationale. SLP educated re: rationale and recommended frequency, reviewed HEP with pt, with occasional modeling and occasional cues for carryover of dyarthria compensations (slow, loud, overarticulation, pausing). SLP recommended pt keep a copy of his HEP by his couch and complete exercises during tv commercial breaks; pt agreed he would attempt this more frequently. (cog tx, 14 min) SLP targeted alternating  attention between simple cognitive linguistic tasks. Time differences (80% accuracy, self corrected 1/2 errors), coin counting (60% accuracy with pt s/c 1/3 errors); pt required extended time and occasional min A for switching attention and occasional min A for error awareness.      Assessment / Recommendations / Plan   Plan  Continue with current plan of care      Progression Toward Goals   Progression toward goals  Progressing toward goals         SLP Short Term Goals - 11/19/17 1153      SLP SHORT TERM GOAL #1   Title  pt will undergo cognitive linguistic testing in teh first 3 sessions if necessary    Status  Achieved      SLP SHORT TERM GOAL #2   Title  pt will demo HEP for dysarthria (oral strength, and compensations) with occasional min A x4 sessions    Time  3    Period  Weeks    Status  On-going      SLP SHORT TERM GOAL #3   Title  pt will use dysarthria compensations in 8 minutes simple conversation to allow for 100% overall intelligibliity x2 sessions    Time  3    Period  Weeks    Status  On-going  SLP SHORT TERM GOAL #4   Title  pt will use dysarthria compensations for 18/20 sentence responses x2 sessions    Time  3    Period  Weeks    Status  On-going      SLP SHORT TERM GOAL #5   Title  pt will use WNL loudness in 8 minutes simple conversation x3 sessions    Time  3    Period  Weeks    Status  On-going      SLP SHORT TERM GOAL #6   Title  Pt will alternate attention between 2 simple cognitive lingusitic tasks with 90% on each    Time  3    Status  On-going       SLP Long Term Goals - 11/19/17 1154      SLP LONG TERM GOAL #1   Title  pt will demo dysarthria HEP withrare min A x3 visits    Time  7    Period  Weeks    Status  On-going      SLP LONG TERM GOAL #2   Title  pt will demo 100% overall intelligibliity in 10 minutes simple to mod complex conversation with modified inedpendence x3 sessions    Time  7    Period  Weeks    Status   On-going      SLP LONG TERM GOAL #3   Title  pt will use average 70dB volume in 10 minutes simple to mod complex converastion x2 sessions    Time  7    Period  Weeks    Status  On-going      SLP LONG TERM GOAL #4   Title  Pt will altenrate attention between 2 moderately complex cognitive linguistic tasks with occasional min A and 85% on each    Time  7    Period  Weeks    Status  On-going      SLP LONG TERM GOAL #5   Title  Pt will correct errors 3/5 on cognitive linguistic tasks with rare min A over 2 sessions    Time  7    Period  Weeks    Status  On-going       Plan - 11/19/17 1015    Clinical Impression Statement  Pt required min to mod A for HEP for dysarthria. SLP encouraged pt to increase frequency of practice at home. Slow processing, decreased error awareness and impaired alternating attention seen today in cognitive linguistic tasks. Continue skilled ST to maximize intellgilbity and cognition for indepedence and safety.     Speech Therapy Frequency  2x / week    Treatment/Interventions  Environmental controls;Cueing hierarchy;SLP instruction and feedback;Oral motor exercises;Compensatory strategies;Functional tasks;Cognitive reorganization;Multimodal communcation approach;Internal/external aids;Patient/family education    Potential to Achieve Goals  Good    Potential Considerations  Cooperation/participation level    Consulted and Agree with Plan of Care  Patient       Patient will benefit from skilled therapeutic intervention in order to improve the following deficits and impairments:   Dysarthria and anarthria  Cognitive communication deficit    Problem List Patient Active Problem List   Diagnosis Date Noted  . Hyperglycemia 10/17/2017  . Acute cerebral infarction (HCC) 09/30/2017  . OSA (obstructive sleep apnea) 09/30/2017  . Smoker 09/30/2017  . 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 Health care maintenance 06/20/2013  .  Severe obesity (BMI 35.0-39.9) with comorbidity (HCC) 12/15/2011  . Skin rash 07/07/2011  .  HTN (hypertension)    Rondel Baton, MS, CCC-SLP Speech-Language Pathologist  Arlana Lindau 11/19/2017, 11:54 AM  [facility] [address] [place], [place], [address] Phone: [phone]   Fax:  [phone]   Name: Thomas Howard MRN: [id number] Date of Birth: 01/15/1961

## 2017-11-20 ENCOUNTER — Encounter: Payer: Self-pay | Admitting: Family Medicine

## 2017-11-20 ENCOUNTER — Telehealth: Payer: Self-pay | Admitting: Family Medicine

## 2017-11-20 DIAGNOSIS — R42 Dizziness and giddiness: Secondary | ICD-10-CM | POA: Insufficient documentation

## 2017-11-20 DIAGNOSIS — I69354 Hemiplegia and hemiparesis following cerebral infarction affecting left non-dominant side: Secondary | ICD-10-CM | POA: Insufficient documentation

## 2017-11-20 NOTE — Assessment & Plan Note (Signed)
Persistently elevated readings despite current regimen - will increase amlodipine to 10mg  daily, continue lisinopril 40mg  and hczt 25mg . Reassess at 1 mo f/u visit.

## 2017-11-20 NOTE — Assessment & Plan Note (Signed)
Activity limited by neurological deficits, continues working with PT.

## 2017-11-20 NOTE — Assessment & Plan Note (Addendum)
Continue aspirin ,plavix, statin.  

## 2017-11-20 NOTE — Assessment & Plan Note (Addendum)
New diagnosis last month. Did not review this today. Will discuss next month.

## 2017-11-20 NOTE — Assessment & Plan Note (Addendum)
Continues decreasing tobacco use - congratulated on this.

## 2017-11-20 NOTE — Assessment & Plan Note (Addendum)
Not consistent with BPPV - neg dix hallpike. Strange temporal nature of symptoms - predominate in the morning associated with marked fatigue, then resolve later in the day. Main concern is CVA etiology. Will start by working towards better hypertension control, if persistent vertigo, low threshold to reimage. Has f/u neuro appt scheduled later this month.

## 2017-11-20 NOTE — Assessment & Plan Note (Signed)
Ongoing neurological deficits, therapist recommend extending sessions - new referral placed.

## 2017-11-20 NOTE — Telephone Encounter (Signed)
plz call to schedule 1 mo f/u visit (I don't see it was scheduled). Also, we did not discuss his high sugars. I'd like them to check with insurance to see preferred glucometer brand for me to prescribe so he can monitor fasting sugars intermittently and if staying high we will start diabetes medication.  Let me know if vertigo isn't improving with better BP control to order further head imaging.

## 2017-11-22 NOTE — Telephone Encounter (Signed)
Spoke with pt relaying Dr. Timoteo ExposeG's message.  Pt verbalizes understanding.  States he is still having dizzy spells and agrees to further head imaging.  Says he will call back with glucometer info.  Pt scheduled for f/u on 12/14/17 at 9:00 AM.

## 2017-11-23 ENCOUNTER — Encounter: Payer: BLUE CROSS/BLUE SHIELD | Admitting: Occupational Therapy

## 2017-11-25 ENCOUNTER — Encounter: Payer: BLUE CROSS/BLUE SHIELD | Admitting: Occupational Therapy

## 2017-11-30 ENCOUNTER — Ambulatory Visit: Payer: BLUE CROSS/BLUE SHIELD | Admitting: Neurology

## 2017-11-30 ENCOUNTER — Encounter: Payer: Self-pay | Admitting: Neurology

## 2017-11-30 VITALS — BP 138/83 | HR 102 | Ht 68.0 in | Wt 237.0 lb

## 2017-11-30 DIAGNOSIS — I6381 Other cerebral infarction due to occlusion or stenosis of small artery: Secondary | ICD-10-CM

## 2017-11-30 DIAGNOSIS — G4733 Obstructive sleep apnea (adult) (pediatric): Secondary | ICD-10-CM | POA: Diagnosis not present

## 2017-11-30 DIAGNOSIS — I10 Essential (primary) hypertension: Secondary | ICD-10-CM

## 2017-11-30 DIAGNOSIS — I6522 Occlusion and stenosis of left carotid artery: Secondary | ICD-10-CM | POA: Diagnosis not present

## 2017-11-30 HISTORY — DX: Other cerebral infarction due to occlusion or stenosis of small artery: I63.81

## 2017-11-30 NOTE — Progress Notes (Signed)
SLEEP MEDICINE CLINIC   Provider:  Melvyn Howard, M D  Primary Care Physician:  Thomas Boyden, MD   Referring Provider: Eustaquio Boyden, MD    Chief Complaint  Patient presents with  . New Patient (Initial Visit)    pt with wife, rm 10. pt states that he recently had a stroke. MD wanted him to look into his sleep apnea. pt had a study completed through [place] over 5 years ago and started CPAP but then stopped the CPAP. He has not used a CPAP in 4-5 years. averages about 5-8 hours of sleep during the night. wife states that he does snore and stops breathing in sleep. pt describes feeling tired during the daytime.     HPI:  Thomas Thomas Howard is a 57 y.o. Thomas Howard , seen here in a referral from Dr. Sharen Howard for a sleep apnea evaluation. The patient has a history of DM, HTN and developed headaches on 09-22-2017, with facial droop and dysarthria- was evaluated in [place]  and diagnosed with a lacunar basal ganglia  non - hemorrhagic stroke. Thomas Thomas Howard is seen here today in the presence of his wife, and this visit is meant to address possible risk factors for future strokes.  Since lacunar infarcts are usually closely related to small and mid size vessel disease with the main risk factor being hypertension and secondary risk factors of diabetes or migraine it is also also important to eliminate sleep apnea.  I explained that treatment of sleep apnea reduces hypertension, and increases the insulin production.  The patient had undergone a sleep study several years ago in [place] at the [place], must have been diagnosed with obstructive sleep apnea at the time as he was issued a CPAP with a full facemask which she could not tolerate, he stated that the air was too dry irritated his airway, he often inadvertently and unintentionally removed the mask at night during his sleep.  He has not used a CPAP in several years.    Sleep habits are as follows: He usually retreats to the bathroom between 9 and 10 PM.  Prior  to going to sleep he spends his evening watching TV in the living room or then.  The marital bedroom is described as cool, quiet and dark.  The patient prefers prone sleep, using one pillow.  He has no trouble initiating sleep and according to his wife began snoring almost immediately.  Snoring is louder for her to sleep on his back.  Sometimes he also drools.  He is usually up at 2 AM for bathroom break and then again around 4- 5 AM.  He reports dreaming often and vividly.  He has been acting out some of his more lucid dreams, kicking or yelling or thrashing about.  He has not been a sleep walker.  These spells occur while he is in bed and usually time-limited to 2 or 3 minutes.  He does not remember most of these. His usual rise time is around 6 AM, he wakes spontaneously at that time.  He does not feel rested and fully restored, and has always felt he needs more sleep.  He is day with a baseline sense of fatigue, dizziness, now that he is currently not working he is back in bed by 9 AM for another couple of hours of sleep.  Altogether his total sleep time may average about 8 to 10 hours.    Sleep medical history and family sleep history:  diagnosed with OSA at [facility]  a long time " ago, about 8-10 years. Has a CPAP of same age. FFM - non compliant.  Borderline diabetic, had nutrional teaching, cholesterol is high, HTN.   Social history: married, currently out of work, physical job- In [place]. 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  Children - 2 sons age 61 and 64.  Tobacco smoker- quit but vapes for the last 5 years. ETOH- less than once a month. Caffeine- sodas , drinks 8 at work- and occassionally iced tea or coffee. 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   Review of Systems: Out of a complete 14 system review, the patient complains of only the following symptoms, and all other reviewed systems are negative. Drooling left hand clumsiness( none dominant hand) drooling, dysarthria.  Facial droop.    Epworth score: 23/ 24 EDS - the only question endorsed at 2 instead of 3 points was "sitting and talking to someone".  , Fatigue severity score 61/ 63 , depression score 5/ 15    Social History   Socioeconomic History  . Marital status: Single    Spouse name: Not on file  . Number of children: Not on file  . Years of education: Not on file  . Highest education level: Not on file  Occupational History  . Not on file  Social Needs  . Financial resource strain: Not on file  . Food insecurity:    Worry: Not on file    Inability: Not on file  . Transportation needs:    Medical: Not on file    Non-medical: Not on file  Tobacco Use  . Smoking status: Current Every Day Smoker    Packs/day: 1.00    Types: Cigarettes  . Smokeless tobacco: Never Used  Substance and Sexual Activity  . Alcohol use: Yes    Comment: Occasional  . Drug use: No  . Sexual activity: Not on file  Lifestyle  . Physical activity:    Days per week: Not on file    Minutes per session: Not on file  . Stress: Not on file  Relationships  . Social connections:    Talks on phone: Not on file    Gets together: Not on file    Attends religious service: Not on file    Active member of club or organization: Not on file    Attends meetings of clubs or organizations: Not on file    Relationship status: Not on file  . Intimate partner violence:    Fear of current or ex partner: Not on file    Emotionally abused: Not on file    Physically abused: Not on file    Forced sexual activity: Not on file  Other Topics Concern  . Not on file  Social History Narrative   Caffeine: sodas - 6 pack/day (pepsi, mt dew, ginger ale)   Lives with GF Richardean Canal), 1 cat.  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 Diabetes Mother   . Hypertension Mother   .  Diabetes Maternal Grandmother   . Hypertension Maternal Grandmother   . Coronary artery disease Neg Hx   . Stroke Neg Hx   . Cancer Neg Hx     Past Medical History:  Diagnosis Date  . History of smoking   . HTN (hypertension)   . Hyperlipemia   . Obesity   .  Wears partial dentures     Past Surgical History:  Procedure Laterality Date  . MOUTH SURGERY    . WISDOM TOOTH EXTRACTION      Current Outpatient Medications  Medication Sig Dispense Refill  . amLODipine (NORVASC) 10 MG tablet Take 1 tablet (10 mg total) by mouth daily.    Marland Kitchen. aspirin 81 MG EC tablet Take 81 mg by mouth daily.  0  . atorvastatin (LIPITOR) 80 MG tablet Take 1 tablet (80 mg total) by mouth at bedtime. 90 tablet 3  . clopidogrel (PLAVIX) 75 MG tablet Take 1 tablet (75 mg total) by mouth daily. 90 tablet 3  . hydrochlorothiazide (HYDRODIURIL) 50 MG tablet Take 0.5 tablets (25 mg total) by mouth daily.    Marland Kitchen. lisinopril (PRINIVIL,ZESTRIL) 40 MG tablet Take 1 tablet (40 mg total) by mouth daily. 90 tablet 3  . nicotine (NICODERM CQ - DOSED IN MG/24 HOURS) 21 mg/24hr patch Place 21 mg onto the skin daily.     No current facility-administered medications for this visit.     Allergies as of 11/30/2017  . (No Known Allergies)    Vitals: BP 138/83   Pulse (!) 102   Ht 5\' 8"  (1.727 m)   Wt 237 lb (107.5 kg)   BMI 36.04 kg/m  Last Weight:  Wt Readings from Last 1 Encounters:  11/30/17 237 lb (107.5 kg)   ZOX:WRUEBMI:Body mass index is 36.04 kg/m.     Last Height:   Ht Readings from Last 1 Encounters:  11/30/17 5\' 8"  (1.727 m)    Physical exam:  General: The patient is awake, alert and appears not in acute distress. The patient is well groomed. Head: Normocephalic, atraumatic. Neck is supple. Mallampati 5,  neck circumference:17.5 . Nasal airflow patent ,Retrognathia is not seen.  Cardiovascular:  Regular rate and rhythm, without  murmurs or carotid bruit, and without distended neck veins. Respiratory: Lungs  are clear to auscultation. Skin:  Without evidence of edema, or rash Trunk: BMI is 36.  The patient's posture is erect.   Neurologic exam : The patient is awake and alert, oriented to place and time.  Attention span & concentration ability appears normal.  Speech is fluent, with dysarthria, dysphonia and reports aphasia.  Mood and affect are appropriate.  Cranial nerves: Pupils are equal and briskly reactive to light. Funduscopic exam without evidence of pallor or edema.  Extraocular movements  in vertical and horizontal planes intact and without nystagmus. Visual fields restricted to the left  perimetry , homonymously. Hearing to finger rub intact . Facial sensation intact to fine touch.  Facial motor strength - lower facial droop ,  and tongue  midline. Shoulder shrug - left shoulder droops. .   Motor exam:   Normal tone, muscle bulk and symmetric strength in all extremities. Left hand grip weakness.   Sensory:  Fine touch, and vibration were tested in all extremities.  He feels all modalities less on the left- intact temperature.  Proprioception tested in the upper extremities : left pronatordrift. .  Coordination: Rapid alternating movements in the fingers/hands was normal. Finger-to-nose maneuver with evidence of  dysmetria , not  Tremor. Bilaterally !  Gait and station: Patient walks without assistive device  .Tandem gait deferred . Turns with 4 Steps. Romberg testing isnegative.  Deep tendon reflexes: in the  upper and lower extremities are asymmetric and more brisk on the left     Assessment: I [facility] and discussed the neurological  impact of the stroke location, risk factors and life style changes.  After physical and neurologic examination, review of laboratory studies,  Personal review of imaging studies, reports of other /same  Imaging studies, results of polysomnography and / or neurophysiology testing and pre-existing records as  far as provided in visit., my assessment is   1) patient with a basal ganglia lacunar stroke in [place], manifesting with left sided clumsiness, left periheral vision loss and slurred, dysphonic speech and dysphagia.   2) snoring , untreated OSA- non compliant with CPAP, but diagnosis is almost a decade old.   3) excessive daytime sleepiness since the stroke to 23/ 24 points on Epworth/  "Sleepy before but not as bad" which fits right sided brain stroke.    The patient was advised of the nature of the diagnosed disorder , the treatment options and the  risks for general health and wellness arising from not treating the condition.   I spent more than 55 minutes of face to face time with the patient.  Greater than 50% of time was spent in counseling and coordination of care. We have discussed the diagnosis and differential and I answered the patient's questions.    Plan:  Treatment plan and additional workup :  Attended SPLIT night study - make him an early patient , please ! SPLIT at [facility] 20, watch for a fib, hypoxemia( long time smoker)  Given his stroke I like to obtain BP once upon arrival in sleep lab and once in AM.  Patient " hated FFM and has facial hair- may try nasal pillow.  Smoking cessation - had nicotine patches.    Thomas Novas, MD 11/30/2017, 9:56 AM  Certified in Neurology by ABPN Certified in Sleep Medicine by [facility] [address] [place], [place] [address]

## 2017-11-30 NOTE — Patient Instructions (Signed)

## 2017-12-01 ENCOUNTER — Encounter: Payer: Self-pay | Admitting: Occupational Therapy

## 2017-12-01 ENCOUNTER — Ambulatory Visit: Payer: BLUE CROSS/BLUE SHIELD

## 2017-12-01 ENCOUNTER — Ambulatory Visit: Payer: BLUE CROSS/BLUE SHIELD | Admitting: Diagnostic Neuroimaging

## 2017-12-01 ENCOUNTER — Encounter: Payer: Self-pay | Admitting: Diagnostic Neuroimaging

## 2017-12-01 ENCOUNTER — Ambulatory Visit: Payer: BLUE CROSS/BLUE SHIELD | Admitting: Occupational Therapy

## 2017-12-01 VITALS — BP 172/97 | HR 93

## 2017-12-01 VITALS — BP 135/85 | HR 89 | Ht 68.0 in | Wt 237.2 lb

## 2017-12-01 DIAGNOSIS — I69354 Hemiplegia and hemiparesis following cerebral infarction affecting left non-dominant side: Secondary | ICD-10-CM | POA: Diagnosis not present

## 2017-12-01 DIAGNOSIS — R278 Other lack of coordination: Secondary | ICD-10-CM

## 2017-12-01 DIAGNOSIS — R41841 Cognitive communication deficit: Secondary | ICD-10-CM | POA: Diagnosis not present

## 2017-12-01 DIAGNOSIS — I69315 Cognitive social or emotional deficit following cerebral infarction: Secondary | ICD-10-CM | POA: Diagnosis not present

## 2017-12-01 DIAGNOSIS — R471 Dysarthria and anarthria: Secondary | ICD-10-CM

## 2017-12-01 DIAGNOSIS — R2681 Unsteadiness on feet: Secondary | ICD-10-CM | POA: Diagnosis not present

## 2017-12-01 DIAGNOSIS — R41842 Visuospatial deficit: Secondary | ICD-10-CM | POA: Diagnosis not present

## 2017-12-01 DIAGNOSIS — I6381 Other cerebral infarction due to occlusion or stenosis of small artery: Secondary | ICD-10-CM

## 2017-12-01 DIAGNOSIS — R293 Abnormal posture: Secondary | ICD-10-CM | POA: Diagnosis not present

## 2017-12-01 DIAGNOSIS — M6281 Muscle weakness (generalized): Secondary | ICD-10-CM

## 2017-12-01 DIAGNOSIS — Z9181 History of falling: Secondary | ICD-10-CM | POA: Diagnosis not present

## 2017-12-01 DIAGNOSIS — I10 Essential (primary) hypertension: Secondary | ICD-10-CM

## 2017-12-01 DIAGNOSIS — R2689 Other abnormalities of gait and mobility: Secondary | ICD-10-CM | POA: Diagnosis not present

## 2017-12-01 DIAGNOSIS — R261 Paralytic gait: Secondary | ICD-10-CM | POA: Diagnosis not present

## 2017-12-01 NOTE — Therapy (Signed)
[facility] [address] [place], [place], [address] Phone: [phone]   Fax:  [phone]  Occupational Therapy Treatment  Patient Details  Name: Thomas Howard MRN: [id number] Date of Birth: Jan 31, 1961 Referring Provider: Eustaquio Boyden, MD   Encounter Date: 12/01/2017  OT End of Session - 12/01/17 1419    Visit Number  8    Number of Visits  25    Date for OT Re-Evaluation  01/13/18    Authorization Type  [employer] - 60 visits combined for OT/PT, if occurs same day counts as one visit    Authorization - Visit Number  8    Authorization - Number of Visits  30    OT Start Time  1405    OT Stop Time  1445    OT Time Calculation (min)  40 min    Activity Tolerance  Patient tolerated treatment well    Behavior During Therapy  [facility] for tasks assessed/performed       Past Medical History:  Diagnosis Date  . History of smoking   . HTN (hypertension)   . Hyperlipemia   . Obesity   . Wears partial dentures     Past Surgical History:  Procedure Laterality Date  . MOUTH SURGERY    . WISDOM TOOTH EXTRACTION      Vitals:   12/01/17 1410 12/01/17 1417  BP: (!) 194/116 (!) 172/97  Pulse:  93    Subjective Assessment - 12/01/17 1437    Subjective   Pt reports he had a good trip    Pertinent History  hospitalized 09/22/17-09/25/17 at [facility] with acute nonhemorrhagic right basal ganglia lacunar infarct and HTN.    Patient Stated Goals  regain use of LUE    Currently in Pain?  No/denies             Treatment: Pt arrived to therapy from ST, pt's BP was elevated pt was given water and allowed to rest for a few mins. Pt's BP was reduced.  Therapist reviewed compensations strategies with pt for visual deficits, pt was able to recall without prompts. Tabletop 1.5 M cancellation 100% accurate with increased time. Therapist discussed elevated BP with pt's father in law and requested that BP was monitored at  home and pt should call [place] if it remains elevated.                 OT Short Term Goals - 12/01/17 1420      OT SHORT TERM GOAL #1   Title  I with inital HEP. extended due 12/03/17, pt missed last week traveling    Status  On-going      OT SHORT TERM GOAL #2   Title  Pt will verbalize understanding of compensatory strategies for visual perceptual  impairment    Status  Achieved      OT SHORT TERM GOAL #3   Title  Pt will perform all basic ADLS with modified indpendently with no more than 2 rest breaks    Status  Achieved      OT SHORT TERM GOAL #4   Title  Pt will perform tabletop scanning activities with 80% or better accuracy for improved safety during ADLs.    Status  On-going      OT SHORT TERM GOAL #5   Title  Pt will perfrom basic environmental scanning with 80% or better accuracy in prep for driving.    Status  On-going      OT  SHORT TERM GOAL #6   Title  Pt will demonstrate ability to retrieve a lightweight object at 130* shoulder flexion without dropping with LUE     Status  Achieved      OT SHORT TERM GOAL #7   Title  further assess cognitve skills and set additional goal PRN    Status  On-going        OT Long Term Goals - 11/18/17 1038      OT LONG TERM GOAL #1   Title  I with updated HEP.    Time  12    Period  Weeks    Status  On-going      OT LONG TERM GOAL #2   Title  Pt will demonstrate improved fine motor coordination  for ADLs as evidenced by decreasing 9 hole peg test score to 38 secs or less.    Baseline  RUE 26.94 sec, LUE 44.97 secs    Time  12    Period  Weeks    Status  On-going      OT LONG TERM GOAL #3   Title  Pt will perform simple cooking task modified indpendently demonstrating good safety awareness.    Time  12    Period  Weeks    Status  On-going      OT LONG TERM GOAL #4   Title  Pt will perfrom tabletop and environmental scanning activities with 90% or better accurary in prep for ADLs and work activities.    Time   12    Period  Weeks    Status  On-going      OT LONG TERM GOAL #5   Title  Pt will demonstrate adequate LUE strength and control to retrieve a 5 lbs weight from mid level shelf x 5 reps without drops.    Time  12    Period  Weeks    Status  On-going            Plan - 12/01/17 1437    Clinical Impression Statement  Pt is progressing towards goals with improving overall awareness of deficits. He reports he did have one fall at the end of his trip.    Rehab Potential  Good    Current Impairments/barriers affecting progress:  severity of deficits    OT Frequency  2x / week    OT Duration  12 weeks    OT Treatment/Interventions  Self-care/ADL training;Electrical Stimulation;Therapeutic exercise;Visual/perceptual remediation/compensation;Coping strategies training;Patient/family education;Splinting;Neuromuscular education;Paraffin;Moist Heat;Fluidtherapy;Energy conservation;Therapeutic activities;Cognitive remediation/compensation;Balance training;Passive range of motion;Manual Therapy;DME and/or AE instruction;Contrast Bath;Ultrasound;Cryotherapy    Plan  green putty HEP, environmental scanning/ visual perceptual skills, memory (retrieval), coordination, NMR for strength with overhead reach       Patient will benefit from skilled therapeutic intervention in order to improve the following deficits and impairments:  Abnormal gait, Decreased cognition, Decreased knowledge of use of DME, Impaired flexibility, Impaired vision/preception, Decreased mobility, Decreased coordination, Decreased activity tolerance, Decreased endurance, Decreased range of motion, Decreased strength, Impaired UE functional use, Impaired perceived functional ability, Difficulty walking, Decreased safety awareness, Decreased knowledge of precautions, Decreased balance  Visit Diagnosis: Visuospatial deficit  Muscle weakness (generalized)  Cognitive social or emotional deficit following cerebral infarction  Other  lack of coordination    Problem List Patient Active Problem List   Diagnosis Date Noted  . Essential hypertension 11/30/2017  . Lacunar stroke of right subthalamic region (HCC) 11/30/2017  . Occlusion and stenosis of left carotid artery 11/30/2017  . Hemiplegia and hemiparesis  following cerebral infarction affecting left non-dominant side (HCC) 11/20/2017  . Vertigo 11/20/2017  . Uncontrolled type 2 diabetes mellitus with neurologic complication, without long-term current use of insulin (HCC) 10/17/2017  . History of hemorrhagic cerebrovascular accident (CVA) with residual deficit 09/30/2017  . OSA (obstructive sleep apnea) 09/30/2017  . Smoker 09/30/2017  . 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 Health care maintenance 06/20/2013  . Severe obesity (BMI 35.0-39.9) with comorbidity (HCC) 12/15/2011  . Skin rash 07/07/2011  . HTN (hypertension)     Shaylynn Nulty 12/01/2017, 2:39 PM  [facility] [address] [place], [place], [address] Phone: [phone]   Fax:  [phone]  Name: Thomas Howard MRN: [id number] Date of Birth: 1960/05/16

## 2017-12-01 NOTE — Therapy (Signed)
[facility] [address] [place], [place], [address] Phone: [phone]   Fax:  [phone]  Speech Language Pathology Treatment  Patient Details  Name: Thomas Howard MRN: [id number] Date of Birth: [birth year] Referring Provider: Eustaquio Boyden, MD   Encounter Date: 12/01/2017  End of Session - 12/01/17 1422    Visit Number  4    Number of Visits  17    Date for SLP Re-Evaluation  12/31/17    SLP Start Time  1320    SLP Stop Time   1401    SLP Time Calculation (min)  41 min    Activity Tolerance  Patient tolerated treatment well       Past Medical History:  Diagnosis Date  . History of smoking   . HTN (hypertension)   . Hyperlipemia   . Obesity   . Wears partial dentures     Past Surgical History:  Procedure Laterality Date  . MOUTH SURGERY    . WISDOM TOOTH EXTRACTION      There were no vitals filed for this visit.  Subjective Assessment - 12/01/17 1324    Subjective  "I forgot to bring my homework."    Currently in Pain?  No/denies            ADULT SLP TREATMENT - 12/01/17 1324      General Information   Behavior/Cognition  Alert;Cooperative;Pleasant mood      Treatment Provided   Treatment provided  Cognitive-Linquistic      Cognitive-Linquistic Treatment   Treatment focused on  Dysarthria;Cognition    Skilled Treatment  Pt had detailed but simple math task to complete, req'd extra time - spent 9 minutes on bakery items due to decr'd sustained and alternating attention. He req'd cues for alternating attention (calculator <-> task). Emergent awareness decr'd as pt with 3/6 correct. SLP told pt whenever he does any detailed task he should double check his work. Cont'd, perseverative errors without pt awareness.      Assessment / Recommendations / Plan   Plan  Continue with current plan of care      Progression Toward Goals   Progression toward goals  Progressing toward goals        SLP Education - 12/01/17 1421    Education Details  need to double check all work at home and in therapy for accuracy    Person(s) Educated  Patient    Methods  Explanation    Comprehension  Verbalized understanding       SLP Short Term Goals - 12/01/17 1424      SLP SHORT TERM GOAL #1   Title  pt will undergo cognitive linguistic testing in teh first 3 sessions if necessary    Status  Achieved      SLP SHORT TERM GOAL #2   Title  pt will demo HEP for dysarthria (oral strength, and compensations) with occasional min A x4 sessions    Time  2    Period  Weeks    Status  On-going      SLP SHORT TERM GOAL #3   Title  pt will use dysarthria compensations in 8 minutes simple conversation to allow for 100% overall intelligibliity x2 sessions    Time  2    Period  Weeks    Status  On-going      SLP SHORT TERM GOAL #4   Title  pt will use dysarthria compensations for 18/20 sentence responses x2 sessions  Time  2    Period  Weeks    Status  On-going      SLP SHORT TERM GOAL #5   Title  pt will use WNL loudness in 8 minutes simple conversation x3 sessions    Time  2    Period  Weeks    Status  On-going      SLP SHORT TERM GOAL #6   Title  Pt will alternate attention between 2 simple cognitive lingusitic tasks with 90% on each    Time  2    Status  On-going       SLP Long Term Goals - 12/01/17 1424      SLP LONG TERM GOAL #1   Title  pt will demo dysarthria HEP withrare min A x3 visits    Time  6    Period  Weeks    Status  On-going      SLP LONG TERM GOAL #2   Title  pt will demo 100% overall intelligibliity in 10 minutes simple to mod complex conversation with modified inedpendence x3 sessions    Time  6    Period  Weeks    Status  On-going      SLP LONG TERM GOAL #3   Title  pt will use average 70dB volume in 10 minutes simple to mod complex converastion x2 sessions    Time  6    Period  Weeks    Status  On-going      SLP LONG TERM GOAL #4   Title  Pt  will altenrate attention between 2 moderately complex cognitive linguistic tasks with occasional min A and 85% on each    Time  6    Period  Weeks    Status  On-going      SLP LONG TERM GOAL #5   Title  Pt will correct errors 3/5 on cognitive linguistic tasks with rare min A over 2 sessions    Time  6    Period  Weeks    Status  On-going       Plan - 12/01/17 1422    Clinical Impression Statement  Pt demonstrated  impaired sustained and alternating attention today in simple but detailed cognitive linguistic tasks. Pt made perseverative errors without awareness demonstrating decr'd emergent awareness as well. Continue skilled ST to maximize intellgilbity and cognition for indepedence and safety.     Speech Therapy Frequency  2x / week    Duration  -- 8 weeks/17 visits    Treatment/Interventions  Environmental controls;Cueing hierarchy;SLP instruction and feedback;Oral motor exercises;Compensatory strategies;Functional tasks;Cognitive reorganization;Multimodal communcation approach;Internal/external aids;Patient/family education    Potential to Achieve Goals  Good    Potential Considerations  Cooperation/participation level    Consulted and Agree with Plan of Care  Patient       Patient will benefit from skilled therapeutic intervention in order to improve the following deficits and impairments:   Cognitive communication deficit  Dysarthria and anarthria    Problem List Patient Active Problem List   Diagnosis Date Noted  . Essential hypertension 11/30/2017  . Lacunar stroke of right subthalamic region (HCC) 11/30/2017  . Occlusion and stenosis of left carotid artery 11/30/2017  . Hemiplegia and hemiparesis following cerebral infarction affecting left non-dominant side (HCC) 11/20/2017  . Vertigo 11/20/2017  . Uncontrolled type 2 diabetes mellitus with neurologic complication, without long-term current use of insulin (HCC) 10/17/2017  . History of hemorrhagic cerebrovascular  accident (CVA) with residual deficit 09/30/2017  .  OSA (obstructive sleep apnea) 09/30/2017  . Smoker 09/30/2017  . 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 Health care maintenance 06/20/2013  . Severe obesity (BMI 35.0-39.9) with comorbidity (HCC) 12/15/2011  . Skin rash 07/07/2011  . HTN (hypertension)     Verdie MosherSCHINKE,CARL ,MS, CCC-SLP  12/01/2017, 2:25 PM  [facility] [address] [place], [place], [address] Phone: [phone]   Fax:  [phone]   Name: Thomas Howard MRN: [id number] Date of Birth: 1960/09/10

## 2017-12-01 NOTE — Patient Instructions (Signed)
RIGHT BRAIN STROKE  - continue aspirin 81 + plavix 75 daily x 3 months after stroke; then reduce to aspirin 81mg  daily alone (~12/23/17) - continue atorvastatin, BP control - try to stop smoking cigarettes - follow up diabetes treatment with PCP (A1c 9.9) - follow up sleep study

## 2017-12-01 NOTE — Patient Instructions (Signed)
  Please complete the assigned speech therapy homework prior to your next session and return it to the speech therapist at your next visit.  

## 2017-12-01 NOTE — Progress Notes (Signed)
[facility]  PATIENT: Thomas Howard DOB: [birth year]  REFERRING CLINICIAN: Denton Meek HISTORY FROM: patient and wife and hospital record review  REASON FOR VISIT: new consult    HISTORICAL  CHIEF COMPLAINT:  Chief Complaint  Patient presents with  . NP Dr. Sharen Hones  . Post Stroke    Was seen yesterday by Dr. Vickey Huger for sleep.  Taking plavix and 81mg  asa.    HISTORY OF PRESENT ILLNESS:   57 year old male here for evaluation of stroke.  On 09/22/2017, patient woke up at 8:00 in the morning and had sudden onset of slurred speech and left-sided weakness.  Patient was taken to the hospital for evaluation.  Blood pressure on presentation was 240/113.  IV TPA was not given.  Patient had stroke work-up.  Patient was discharged home on aspirin, Plavix, statin, blood pressure control.  Since that time patient is doing well.  Symptoms are gradually improving.  Patient continues to smoke cigarettes but is trying to quit.  He is taking his medications.  Patient has had sleep study consultation yesterday and is planning to have a sleep study.   REVIEW OF SYSTEMS: Full 14 system review of systems performed and negative with exception of: Snoring weakness restless legs dizziness anxiety racing thoughts feeling hot increased thirst fatigue rash.  ALLERGIES: No Known Allergies  HOME MEDICATIONS: Outpatient Medications Prior to Visit  Medication Sig Dispense Refill  . amLODipine (NORVASC) 10 MG tablet Take 1 tablet (10 mg total) by mouth daily.    Marland Kitchen aspirin 81 MG EC tablet Take 81 mg by mouth daily.  0  . atorvastatin (LIPITOR) 80 MG tablet Take 1 tablet (80 mg total) by mouth at bedtime. 90 tablet 3  . clopidogrel (PLAVIX) 75 MG tablet Take 1 tablet (75 mg total) by mouth daily. 90 tablet 3  . hydrochlorothiazide (HYDRODIURIL) 50 MG tablet Take 0.5 tablets (25 mg total) by mouth daily.    Marland Kitchen lisinopril (PRINIVIL,ZESTRIL) 40 MG tablet Take 1 tablet (40 mg total) by  mouth daily. 90 tablet 3  . nicotine (NICODERM CQ - DOSED IN MG/24 HOURS) 21 mg/24hr patch Place 21 mg onto the skin daily.     No facility-administered medications prior to visit.     PAST MEDICAL HISTORY: Past Medical History:  Diagnosis Date  . History of smoking   . HTN (hypertension)   . Hyperlipemia   . Obesity   . Wears partial dentures     PAST SURGICAL HISTORY: Past Surgical History:  Procedure Laterality Date  . MOUTH SURGERY    . WISDOM TOOTH EXTRACTION      FAMILY HISTORY: Family History  Problem Relation Age of Onset  . Diabetes Mother   . Hypertension Mother   . Diabetes Maternal Grandmother   . Hypertension Maternal Grandmother   . Coronary artery disease Neg Hx   . Stroke Neg Hx   . Cancer Neg Hx     SOCIAL HISTORY: Social History   Socioeconomic History  . Marital status: Single    Spouse name: Not on file  . Number of children: Not on file  . Years of education: Not on file  . Highest education level: Not on file  Occupational History  . Not on file  Social Needs  . Financial resource strain: Not on file  . Food insecurity:    Worry: Not on file    Inability: Not on file  . Transportation needs:    Medical: Not on file    Non-medical:  Not on file  Tobacco Use  . Smoking status: Current Every Day Smoker    Packs/day: 1.00    Types: Cigarettes  . Smokeless tobacco: Never Used  Substance and Sexual Activity  . Alcohol use: Yes    Comment: Occasional  . Drug use: No  . Sexual activity: Not on file  Lifestyle  . Physical activity:    Days per week: Not on file    Minutes per session: Not on file  . Stress: Not on file  Relationships  . Social connections:    Talks on phone: Not on file    Gets together: Not on file    Attends religious service: Not on file    Active member of club or organization: Not on file    Attends meetings of clubs or organizations: Not on file    Relationship status: Not on file  . Intimate partner  violence:    Fear of current or ex partner: Not on file    Emotionally abused: Not on file    Physically abused: Not on file    Forced sexual activity: Not on file  Other Topics Concern  . Not on file  Social History Narrative   Caffeine: sodas - 6 pack/day (pepsi, mt dew, ginger ale)   Lives with GF Richardean Canal(Apri Blackwell), 1 cat.  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 Wt Readings from Last 3 Encounters:  12/01/17 237 lb 3.2 oz (107.6 kg)  11/30/17 237 lb (107.5 kg)  11/16/17 237 lb 12 oz (107.8 kg)     Patient is in no distress; well developed, nourished and groomed; neck is supple  CARDIOVASCULAR:  Examination of carotid arteries is normal; no carotid bruits  Regular rate and rhythm, no murmurs  Examination of peripheral vascular system by observation and palpation is normal  EYES:  Ophthalmoscopic exam of optic discs and posterior segments is normal; no papilledema or hemorrhages  Visual Acuity Screening   Right eye Left eye Both eyes  Without correction: 20/40 20/30   With correction:        MUSCULOSKELETAL:  Gait, strength, tone, movements noted in Neurologic exam below  NEUROLOGIC: MENTAL STATUS:  No flowsheet data found.  awake, alert, oriented to person, place and time  recent and remote memory intact  normal attention and concentration  language fluent, comprehension intact, naming intact  fund of knowledge appropriate  CRANIAL NERVE:   2nd - no papilledema on fundoscopic exam  2nd, 3rd, 4th, 6th - pupils equal and reactive to light, visual fields full to confrontation, extraocular muscles intact, no nystagmus  5th - facial sensation  symmetric  7th - facial strength symmetric  8th - hearing intact  9th - palate elevates symmetrically, uvula midline  11th - shoulder shrug symmetric  12th - tongue protrusion midline  MODERATE DYSARTHRIA  MOTOR:   normal bulk and tone, full strength in the RUE, RLE; LUE DELTOID 4 OTHERWISE 5; LLE HIP FLEX 4 OTHERWISE 5  SENSORY:   normal and symmetric to light touch, temperature, vibration; EXCEPT DECR IN LEFT HAND AND LEG  COORDINATION:   finger-nose-finger, fine finger movements, foot tapping -->  SLOW ON LEFT  REFLEXES:   deep tendon reflexes TRACE and symmetric  GAIT/STATION:   narrow based gait; able to walk tandem     DIAGNOSTIC DATA (LABS, IMAGING, TESTING) - I reviewed patient records, labs, notes, testing and imaging myself where available.  Lab Results  Component Value Date   WBC 6.9 09/22/2017   HGB 13.3 (A) 09/22/2017   HCT 44.4 06/20/2013   MCV 89.1 06/20/2013   PLT 224 09/22/2017      Component Value Date/Time   NA 134 (L) 10/14/2017 1031   K 3.3 (L) 10/14/2017 1031   CL 98 10/14/2017 1031   CO2 28 10/14/2017 1031   GLUCOSE 288 (H) 10/14/2017 1031   BUN 23 10/14/2017 1031   CREATININE 1.04 10/14/2017 1031   CALCIUM 9.9 10/14/2017 1031   PROT 7.6 06/20/2013 1217   ALBUMIN 4.0 06/20/2013 1217   AST 19 09/22/2017   ALT 34 09/22/2017   ALKPHOS 110 09/22/2017   BILITOT 1.2 06/20/2013 1217   Lab Results  Component Value Date   CHOL 193 09/22/2017   HDL 41 09/22/2017   LDLCALC 126 09/22/2017   TRIG 123 09/22/2017   CHOLHDL 5 06/20/2013   Lab Results  Component Value Date   HGBA1C 9.9 (H) 10/14/2017   No results found for: [id number] Lab Results  Component Value Date   TSH 2.06 10/07/2017     09/22/17 MRI brain  - acute right basal ganglia ischemic infarction - moderate white matter disease for age  77/15/19 carotid u/s - < 50% stenosis of bilateral ICA    ASSESSMENT AND PLAN  57 y.o. year old male here with:     Dx:  1. Lacunar stroke of right subthalamic region [facility])   2. Essential hypertension       PLAN:  RIGHT BASAL GANGLIA STROKE (09/22/17; small vessel thrombosis; new problem; no workup) - continue aspirin 81 + plavix 75 daily x 3 months post stroke; then reduce to aspirin 81mg  daily alone (~12/23/17) - continue statin, BP control, smoking cessation - follow up diabetes treatment with PCP (A1c 9.9) - follow up sleep study - follow up echocardiogram report from [facility] admission (not available for me to review today)  Return if symptoms worsen or fail to improve, for return to PCP.    Suanne Marker, MD 12/01/2017, 9:03 AM Certified in Neurology, Neurophysiology and Neuroimaging  [facility] [address] [place], [place] [address] [phone]

## 2017-12-03 ENCOUNTER — Ambulatory Visit: Payer: BLUE CROSS/BLUE SHIELD | Admitting: Occupational Therapy

## 2017-12-03 ENCOUNTER — Ambulatory Visit: Payer: BLUE CROSS/BLUE SHIELD | Admitting: Physical Therapy

## 2017-12-03 ENCOUNTER — Ambulatory Visit: Payer: BLUE CROSS/BLUE SHIELD

## 2017-12-03 ENCOUNTER — Encounter: Payer: Self-pay | Admitting: Physical Therapy

## 2017-12-03 VITALS — BP 162/110 | HR 91

## 2017-12-03 DIAGNOSIS — Z9181 History of falling: Secondary | ICD-10-CM | POA: Diagnosis not present

## 2017-12-03 DIAGNOSIS — M6281 Muscle weakness (generalized): Secondary | ICD-10-CM | POA: Diagnosis not present

## 2017-12-03 DIAGNOSIS — R471 Dysarthria and anarthria: Secondary | ICD-10-CM | POA: Diagnosis not present

## 2017-12-03 DIAGNOSIS — R41841 Cognitive communication deficit: Secondary | ICD-10-CM

## 2017-12-03 DIAGNOSIS — R293 Abnormal posture: Secondary | ICD-10-CM | POA: Diagnosis not present

## 2017-12-03 DIAGNOSIS — R41842 Visuospatial deficit: Secondary | ICD-10-CM | POA: Diagnosis not present

## 2017-12-03 DIAGNOSIS — R2681 Unsteadiness on feet: Secondary | ICD-10-CM | POA: Diagnosis not present

## 2017-12-03 DIAGNOSIS — R278 Other lack of coordination: Secondary | ICD-10-CM

## 2017-12-03 DIAGNOSIS — I69315 Cognitive social or emotional deficit following cerebral infarction: Secondary | ICD-10-CM

## 2017-12-03 DIAGNOSIS — R2689 Other abnormalities of gait and mobility: Secondary | ICD-10-CM | POA: Diagnosis not present

## 2017-12-03 DIAGNOSIS — I69354 Hemiplegia and hemiparesis following cerebral infarction affecting left non-dominant side: Secondary | ICD-10-CM | POA: Diagnosis not present

## 2017-12-03 DIAGNOSIS — R261 Paralytic gait: Secondary | ICD-10-CM | POA: Diagnosis not present

## 2017-12-03 NOTE — Patient Instructions (Signed)
Don't get frustrated when April looks at your work. Just put some rules into place. You do the work, check it over, then she can take a look and correct it.

## 2017-12-03 NOTE — Therapy (Signed)
[facility] [address] [place], [place], [address] Phone: [phone]   Fax:  [phone]  Occupational Therapy Treatment  Patient Details  Name: Thomas Howard MRN: [id number] Date of Birth: 11-25-1960 Referring Provider: Eustaquio Boyden, MD   Encounter Date: 12/03/2017  OT End of Session - 12/03/17 0852    Visit Number  9    Number of Visits  25    Date for OT Re-Evaluation  01/13/18    Authorization Type  [employer] - 60 visits combined for OT/PT, if occurs same day counts as one visit    Authorization - Visit Number  9    Authorization - Number of Visits  30    OT Start Time  0850    OT Stop Time  0930    OT Time Calculation (min)  40 min    Activity Tolerance  Patient tolerated treatment well    Behavior During Therapy  [facility] for tasks assessed/performed       Past Medical History:  Diagnosis Date  . History of smoking   . HTN (hypertension)   . Hyperlipemia   . Obesity   . Wears partial dentures     Past Surgical History:  Procedure Laterality Date  . MOUTH SURGERY    . WISDOM TOOTH EXTRACTION      There were no vitals filed for this visit.  Subjective Assessment - 12/03/17 0851    Pertinent History  hospitalized 09/22/17-09/25/17 at [facility] with acute nonhemorrhagic right basal ganglia lacunar infarct and HTN.    Patient Stated Goals  regain use of LUE    Currently in Pain?  No/denies           Treatment:Reviewed supine and  Prone strengthening HEP, pt performed 2 sets of each exercise. Therapist had pt hold 10 lbs weight with bilateral UE's for safety, pt returned demonstration following initial cueing. Tabletop scanning activity to copy small peg design, min v.c for correct placement and increased time requires. BP 112/80                  OT Short Term Goals - 12/03/17 0856      OT SHORT TERM GOAL #1   Title  I with inital HEP. extended due 12/03/17, pt missed last  week traveling    Status  Achieved      OT SHORT TERM GOAL #2   Title  Pt will verbalize understanding of compensatory strategies for visual perceptual  impairment    Status  Achieved      OT SHORT TERM GOAL #3   Title  Pt will perform all basic ADLS with modified indpendently with no more than 2 rest breaks    Status  Achieved      OT SHORT TERM GOAL #4   Title  Pt will perform tabletop scanning activities with 80% or better accuracy for improved safety during ADLs.    Status  Achieved      OT SHORT TERM GOAL #5   Title  Pt will perfrom basic environmental scanning with 80% or better accuracy in prep for driving.    Status  On-going      OT SHORT TERM GOAL #6   Title  Pt will demonstrate ability to retrieve a lightweight object at 130* shoulder flexion without dropping with LUE     Status  Achieved      OT SHORT TERM GOAL #7   Title  further assess cognitve skills and set additional  goal PRN    Status  Deferred Pt is receiving ST        OT Long Term Goals - 11/18/17 1038      OT LONG TERM GOAL #1   Title  I with updated HEP.    Time  12    Period  Weeks    Status  On-going      OT LONG TERM GOAL #2   Title  Pt will demonstrate improved fine motor coordination  for ADLs as evidenced by decreasing 9 hole peg test score to 38 secs or less.    Baseline  RUE 26.94 sec, LUE 44.97 secs    Time  12    Period  Weeks    Status  On-going      OT LONG TERM GOAL #3   Title  Pt will perform simple cooking task modified indpendently demonstrating good safety awareness.    Time  12    Period  Weeks    Status  On-going      OT LONG TERM GOAL #4   Title  Pt will perfrom tabletop and environmental scanning activities with 90% or better accurary in prep for ADLs and work activities.    Time  12    Period  Weeks    Status  On-going      OT LONG TERM GOAL #5   Title  Pt will demonstrate adequate LUE strength and control to retrieve a 5 lbs weight from mid level shelf x 5 reps  without drops.    Time  12    Period  Weeks    Status  On-going            Plan - 12/03/17 0981    Clinical Impression Statement  Pt is progressing towards goals with improving overall awareness of deficits. Pt demonstrates improved basic tabletop scanning, however he required v.c for correct placement when copying peg design.    Occupational performance deficits (Please refer to evaluation for details):  ADL's;IADL's;Work;Leisure;Social Participation    Current Impairments/barriers affecting progress:  severity of deficits    OT Frequency  2x / week    OT Duration  8 weeks    OT Treatment/Interventions  Self-care/ADL training;Electrical Stimulation;Therapeutic exercise;Visual/perceptual remediation/compensation;Coping strategies training;Patient/family education;Splinting;Neuromuscular education;Paraffin;Moist Heat;Fluidtherapy;Energy conservation;Therapeutic activities;Cognitive remediation/compensation;Balance training;Passive range of motion;Manual Therapy;DME and/or AE instruction;Contrast Bath;Ultrasound;Cryotherapy    Plan  environmental scanning, check short term goal, consider green putty for grip    Consulted and Agree with Plan of Care  Patient       Patient will benefit from skilled therapeutic intervention in order to improve the following deficits and impairments:  Abnormal gait, Decreased cognition, Decreased knowledge of use of DME, Impaired flexibility, Impaired vision/preception, Decreased mobility, Decreased coordination, Decreased activity tolerance, Decreased endurance, Decreased range of motion, Decreased strength, Impaired UE functional use, Impaired perceived functional ability, Difficulty walking, Decreased safety awareness, Decreased knowledge of precautions, Decreased balance  Visit Diagnosis: Muscle weakness (generalized)  Visuospatial deficit  Cognitive social or emotional deficit following cerebral infarction  Other lack of coordination    Problem  List Patient Active Problem List   Diagnosis Date Noted  . Essential hypertension 11/30/2017  . Lacunar stroke of right subthalamic region (HCC) 11/30/2017  . Occlusion and stenosis of left carotid artery 11/30/2017  . Hemiplegia and hemiparesis following cerebral infarction affecting left non-dominant side (HCC) 11/20/2017  . Vertigo 11/20/2017  . Uncontrolled type 2 diabetes mellitus with neurologic complication, without long-term current use of insulin (HCC) 10/17/2017  .  History of hemorrhagic cerebrovascular accident (CVA) with residual deficit 09/30/2017  . OSA (obstructive sleep apnea) 09/30/2017  . Smoker 09/30/2017  . 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 Health care maintenance 06/20/2013  . Severe obesity (BMI 35.0-39.9) with comorbidity (HCC) 12/15/2011  . Skin rash 07/07/2011  . HTN (hypertension)     RINE,KATHRYN 12/03/2017, 9:39 AM  [facility] [address] [place], [place], [address] Phone: [phone]   Fax:  [phone]  Name: Thomas Howard MRN: [id number] Date of Birth: 10-21-1960

## 2017-12-03 NOTE — Therapy (Signed)
[facility] [address] [place], [place], [address] Phone: [phone]   Fax:  [phone]  Speech Language Pathology Treatment  Patient Details  Name: Thomas Howard MRN: [id number] Date of Birth: 1961/01/02 Referring Provider: Eustaquio Boyden, MD   Encounter Date: 12/03/2017  End of Session - 12/03/17 1438    Visit Number  5    Number of Visits  17    Date for SLP Re-Evaluation  12/31/17    SLP Start Time  0933    SLP Stop Time   1015    SLP Time Calculation (min)  42 min    Activity Tolerance  Patient tolerated treatment well       Past Medical History:  Diagnosis Date  . History of smoking   . HTN (hypertension)   . Hyperlipemia   . Obesity   . Wears partial dentures     Past Surgical History:  Procedure Laterality Date  . MOUTH SURGERY    . WISDOM TOOTH EXTRACTION      There were no vitals filed for this visit.  Subjective Assessment - 12/03/17 0948    Subjective  Pt brought homework.    Currently in Pain?  No/denies            ADULT SLP TREATMENT - 12/03/17 0949      General Information   Behavior/Cognition  Alert;Cooperative;Pleasant mood      Treatment Provided   Treatment provided  Cognitive-Linquistic      Cognitive-Linquistic Treatment   Treatment focused on  Cognition    Skilled Treatment  SLP reviewed homework with pt today and pt with 70% success, attempted 9/10 responses. Pt talked about his frustration with his wife correcting his work as he was doing it and that was reason he only attempted 9/10. SLP sugg pt complete work, double check it, then allow wife to check his work and he review later and correct if necessary. PT agreed to this.       Assessment / Recommendations / Plan   Plan  Continue with current plan of care      Progression Toward Goals   Progression toward goals  Progressing toward goals       SLP Education - 12/03/17 1437    Education Details  how pt  can use wife's assistance to his benefit rather than to his detriment    Person(s) Educated  Patient    Methods  Explanation;Demonstration    Comprehension  Verbalized understanding       SLP Short Term Goals - 12/03/17 1439      SLP SHORT TERM GOAL #1   Title  pt will undergo cognitive linguistic testing in teh first 3 sessions if necessary    Status  Achieved      SLP SHORT TERM GOAL #2   Title  pt will demo HEP for dysarthria (oral strength, and compensations) with occasional min A x4 sessions    Time  2    Period  Weeks    Status  On-going      SLP SHORT TERM GOAL #3   Title  pt will use dysarthria compensations in 8 minutes simple conversation to allow for 100% overall intelligibliity x2 sessions    Time  2    Period  Weeks    Status  On-going      SLP SHORT TERM GOAL #4   Title  pt will use dysarthria compensations for 18/20 sentence responses x2 sessions  Time  2    Period  Weeks    Status  On-going      SLP SHORT TERM GOAL #5   Title  pt will use WNL loudness in 8 minutes simple conversation x3 sessions    Time  2    Period  Weeks    Status  On-going      SLP SHORT TERM GOAL #6   Title  Pt will alternate attention between 2 simple cognitive lingusitic tasks with 90% on each    Time  2    Status  On-going       SLP Long Term Goals - 12/03/17 1439      SLP LONG TERM GOAL #1   Title  pt will demo dysarthria HEP withrare min A x3 visits    Time  6    Period  Weeks    Status  On-going      SLP LONG TERM GOAL #2   Title  pt will demo 100% overall intelligibliity in 10 minutes simple to mod complex conversation with modified inedpendence x3 sessions    Time  6    Period  Weeks    Status  On-going      SLP LONG TERM GOAL #3   Title  pt will use average 70dB volume in 10 minutes simple to mod complex converastion x2 sessions    Time  6    Period  Weeks    Status  On-going      SLP LONG TERM GOAL #4   Title  Pt will altenrate attention between 2  moderately complex cognitive linguistic tasks with occasional min A and 85% on each    Time  6    Period  Weeks    Status  On-going      SLP LONG TERM GOAL #5   Title  Pt will correct errors 3/5 on cognitive linguistic tasks with rare min A over 2 sessions    Time  6    Period  Weeks    Status  On-going       Plan - 12/03/17 1438    Clinical Impression Statement  Pt cont'd to demo  impaired sustained and alternating attention today in simple but detailed cognitive linguistic tasks. SLP talked with pt re: how wife could assist him instead of frustrate him re: homework. Continue skilled ST to maximize intellgilbity and cognition for indepedence and safety.     Speech Therapy Frequency  2x / week    Duration  -- 8 weeks/17 visits    Treatment/Interventions  Environmental controls;Cueing hierarchy;SLP instruction and feedback;Oral motor exercises;Compensatory strategies;Functional tasks;Cognitive reorganization;Multimodal communcation approach;Internal/external aids;Patient/family education    Potential to Achieve Goals  Good    Potential Considerations  Cooperation/participation level    Consulted and Agree with Plan of Care  Patient       Patient will benefit from skilled therapeutic intervention in order to improve the following deficits and impairments:   Cognitive communication deficit    Problem List Patient Active Problem List   Diagnosis Date Noted  . Essential hypertension 11/30/2017  . Lacunar stroke of right subthalamic region (HCC) 11/30/2017  . Occlusion and stenosis of left carotid artery 11/30/2017  . Hemiplegia and hemiparesis following cerebral infarction affecting left non-dominant side (HCC) 11/20/2017  . Vertigo 11/20/2017  . Uncontrolled type 2 diabetes mellitus with neurologic complication, without long-term current use of insulin (HCC) 10/17/2017  . History of hemorrhagic cerebrovascular accident (CVA) with residual deficit 09/30/2017  .  OSA (obstructive  sleep apnea) 09/30/2017  . Smoker 09/30/2017  . 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 Health care maintenance 06/20/2013  . Severe obesity (BMI 35.0-39.9) with comorbidity (HCC) 12/15/2011  . Skin rash 07/07/2011  . HTN (hypertension)     Febe Champa ,MS, CCC-SLP  12/03/2017, 2:40 PM  [facility] [address] [place], [place], [address] Phone: [phone]   Fax:  [phone]   Name: Antionette FairyDarvin Arrants MRN: [id number] Date of Birth: [birth year]

## 2017-12-04 NOTE — Therapy (Signed)
[facility] [address] [place], [place], [address] Phone: [phone]   Fax:  [phone]  Physical Therapy Treatment  Patient Details  Name: Thomas Howard MRN: [id number] Date of Birth: [birth year] Referring Provider: Eustaquio Boyden, MD   Encounter Date: 12/03/2017  PT End of Session - 12/03/17 1025    Visit Number  9    Number of Visits  26    Date for PT Re-Evaluation  01/14/18    Authorization Type  [employer] 60 visit limit combined PT & OT, counts as 1 visit if seen on same day.  $20 co-pay    Authorization - Visit Number  -- counting PT, OT & speech, counts as 1 if seen on same day    Authorization - Number of Visits  60    PT Start Time  1020 late from ST    PT Stop Time  1047 ended early due to elevated BP    PT Time Calculation (min)  27 min    Equipment Utilized During Treatment  Gait belt    Activity Tolerance  Patient tolerated treatment well    Behavior During Therapy  [facility] for tasks assessed/performed       Past Medical History:  Diagnosis Date  . History of smoking   . HTN (hypertension)   . Hyperlipemia   . Obesity   . Wears partial dentures     Past Surgical History:  Procedure Laterality Date  . MOUTH SURGERY    . WISDOM TOOTH EXTRACTION      Vitals:   12/03/17 1028 12/03/17 1038 12/03/17 1046  BP: (!) 155/94 (!) 167/103 (!) 162/110  Pulse: 85 95 91    Subjective Assessment - 12/03/17 1024    Subjective  No new complaints. No falls or pain to report.     Pertinent History  HTN, + smoker,    Limitations  Lifting;Standing;Walking;House hold activities    Patient Stated Goals  To not be so fatigued with every day activities.     Currently in Pain?  No/denies         [facility] Adult PT Treatment/Exercise - 12/03/17 1029      Ambulation/Gait   Ambulation/Gait  Yes    Ambulation/Gait Assistance  5: Supervision    Ambulation/Gait Assistance Details  PTA set a moderately fast pace for pt  to maintain while engaged in conversation the entire time. BP increased after gait and did not lower with rest/water. Pt reports he took all medications this am. He is to recheck it when he gets home and call his MD if it does not go down. Family member with pt made aware as well and to stay with him/monitor him.     Ambulation Distance (Feet)  1000 Feet    Assistive device  None    Gait Pattern  Step-through pattern;Decreased arm swing - left;Decreased step length - right;Decreased stance time - left;Decreased hip/knee flexion - left;Decreased weight shift to left;Left hip hike;Antalgic;Lateral hip instability;Trunk flexed;Wide base of support           PT Short Term Goals - 11/10/17 1055      PT SHORT TERM GOAL #1   Title  Patient verbalizes & demonstrates exercise program to safely perform at local fitness including if BP in safe range. (All STGs Target Date: 12/17/2017)    Time  4    Period  Weeks    Status  New    Target Date  12/17/17  PT SHORT TERM GOAL #2   Title  Patient tolerates 30 minutes of moderate intensity activities with </= 2 seated rests for <3 minutes.     Time  4    Period  Weeks    Status  New    Target Date  12/17/17      PT SHORT TERM GOAL #3   Title  Patient lifts 35# box & carries 3850' for 10 reps with cues on body mechanics only.     Time  4    Period  Weeks    Status  New    Target Date  12/17/17      PT SHORT TERM GOAL #4   Title  Patient pushes/pulls weight cart 50' 20 reps with verbal cues on technique / body mechanics.     Time  4    Period  Weeks    Status  New    Target Date  12/17/17        PT Long Term Goals - 11/11/17 1111      PT LONG TERM GOAL #1   Title  Patient demonstrates & verbalizes understanding of ongoing fitness plan /  HEP. (All updated LTGs Target  Date: 01/14/2018)    Time  8    Period  Weeks    Status  On-going    Target Date  01/14/18      PT LONG TERM GOAL #2   Title  Patient verbalizes safe BP for moderate to  high intensity activites like fitness program or work.     Time  8    Period  Weeks    Status  New    Target Date  01/14/18      PT LONG TERM GOAL #3   Title  Patient tolerates 40 minutes of standing activities of moderate intensity without seated rests.    Time  8    Period  Weeks    Status  New    Target Date  01/14/18      PT LONG TERM GOAL #4   Title  Functional Gait Assessment >/= 28/30    Time  8    Period  Weeks    Status  Revised    Target Date  01/14/18      PT LONG TERM GOAL #5   Title  Patient lifts 100# crate floor to knee height surface 3 reps and lifts 55# / carries 4250' 10 reps with proper body mechanics safely    Time  8    Period  Weeks    Status  New    Target Date  01/14/18      PT LONG TERM GOAL #6   Title  Patient pushes & pulls up to 125# average, pushes & pulls weighted cart 50' X 20 reps safely.     Time  8    Period  Weeks    Status  New    Target Date  01/14/18            Plan - 12/03/17 1025    Clinical Impression Statement  Today's skilled session was limited due to pt's BP too high after gait activity to proceed with remainder of session. Pt has cuff at home and is to continue to monitor BP at home. If it does not go down he was instructed to call his MD. Reviewed with pt signs and symptoms of CVA. Pt should benefit from continued PT to progress toward goals as his BP allows.  Rehab Potential  Good    Clinical Impairments Affecting Rehab Potential  LLE weakness, high fall risk, significant other has noticed impaired processing verbal instructions    PT Frequency  2x / week    PT Duration  8 weeks    PT Treatment/Interventions  ADLs/Self Care Home Management;Canalith Repostioning;DME Instruction;Gait training;Stair training;Functional mobility training;Therapeutic activities;Therapeutic exercise;Balance training;Neuromuscular re-education;Patient/family education;Vestibular    PT Next Visit Plan  Check BP prior & during PT, lifting,  carrying, pushing, pulling & gait /balance with increasing time tolerates moderate activities    Consulted and Agree with Plan of Care  Patient       Patient will benefit from skilled therapeutic intervention in order to improve the following deficits and impairments:  Abnormal gait, Decreased activity tolerance, Decreased balance, Decreased endurance, Decreased coordination, Decreased mobility, Decreased strength, Dizziness, Postural dysfunction  Visit Diagnosis: Muscle weakness (generalized)  Other abnormalities of gait and mobility  Unsteadiness on feet     Problem List Patient Active Problem List   Diagnosis Date Noted  . Essential hypertension 11/30/2017  . Lacunar stroke of right subthalamic region (HCC) 11/30/2017  . Occlusion and stenosis of left carotid artery 11/30/2017  . Hemiplegia and hemiparesis following cerebral infarction affecting left non-dominant side (HCC) 11/20/2017  . Vertigo 11/20/2017  . Uncontrolled type 2 diabetes mellitus with neurologic complication, without long-term current use of insulin (HCC) 10/17/2017  . History of hemorrhagic cerebrovascular accident (CVA) with residual deficit 09/30/2017  . OSA (obstructive sleep apnea) 09/30/2017  . Smoker 09/30/2017  . 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 Health care maintenance 06/20/2013  . Severe obesity (BMI 35.0-39.9) with comorbidity (HCC) 12/15/2011  . Skin rash 07/07/2011  . HTN (hypertension)     Sallyanne Kuster, PTA, [facility] [address] [place], [place] [address] [phone] 12/04/17, 4:22 PM   Name: Thomas Howard MRN: [id number] Date of Birth: [birth year]

## 2017-12-07 ENCOUNTER — Ambulatory Visit: Payer: BLUE CROSS/BLUE SHIELD | Admitting: Occupational Therapy

## 2017-12-07 ENCOUNTER — Other Ambulatory Visit: Payer: Self-pay | Admitting: Family Medicine

## 2017-12-07 ENCOUNTER — Encounter: Payer: Self-pay | Admitting: Occupational Therapy

## 2017-12-07 ENCOUNTER — Ambulatory Visit: Payer: BLUE CROSS/BLUE SHIELD | Admitting: Speech Pathology

## 2017-12-07 ENCOUNTER — Encounter: Payer: Self-pay | Admitting: Physical Therapy

## 2017-12-07 ENCOUNTER — Ambulatory Visit: Payer: BLUE CROSS/BLUE SHIELD | Admitting: Physical Therapy

## 2017-12-07 VITALS — BP 150/92 | HR 90

## 2017-12-07 VITALS — BP 138/84

## 2017-12-07 DIAGNOSIS — R261 Paralytic gait: Secondary | ICD-10-CM | POA: Diagnosis not present

## 2017-12-07 DIAGNOSIS — I69315 Cognitive social or emotional deficit following cerebral infarction: Secondary | ICD-10-CM | POA: Diagnosis not present

## 2017-12-07 DIAGNOSIS — R293 Abnormal posture: Secondary | ICD-10-CM | POA: Diagnosis not present

## 2017-12-07 DIAGNOSIS — R2681 Unsteadiness on feet: Secondary | ICD-10-CM

## 2017-12-07 DIAGNOSIS — R2689 Other abnormalities of gait and mobility: Secondary | ICD-10-CM | POA: Diagnosis not present

## 2017-12-07 DIAGNOSIS — R278 Other lack of coordination: Secondary | ICD-10-CM

## 2017-12-07 DIAGNOSIS — R471 Dysarthria and anarthria: Secondary | ICD-10-CM

## 2017-12-07 DIAGNOSIS — Z9181 History of falling: Secondary | ICD-10-CM | POA: Diagnosis not present

## 2017-12-07 DIAGNOSIS — R41842 Visuospatial deficit: Secondary | ICD-10-CM

## 2017-12-07 DIAGNOSIS — M6281 Muscle weakness (generalized): Secondary | ICD-10-CM

## 2017-12-07 DIAGNOSIS — R41841 Cognitive communication deficit: Secondary | ICD-10-CM | POA: Diagnosis not present

## 2017-12-07 DIAGNOSIS — I69354 Hemiplegia and hemiparesis following cerebral infarction affecting left non-dominant side: Secondary | ICD-10-CM | POA: Diagnosis not present

## 2017-12-07 NOTE — Patient Instructions (Addendum)
       Vitals:   12/03/17 1028 12/03/17 1038 12/03/17 1046  BP: (!) 155/94 (!) 167/103 (!) 162/110  Pulse: 85 95 91    Rest BP 150/92, HR 90 6 minutes of gait including ramp & stairs, pushing /pulling cart & 3 floor transfers with support: BP 124/73, HR 100 Rest 3 min 6 minutes: lifting/carryin 25# crate & leg press BLE 100# 15 reps.  BP 129/77, HR 98 Rest 3 min 6 minutes: gait with variable cadence, pulling rope with resistance  BP 109/75  HR 100 Rest 3 min 6 min walking outdoors including grass slope, tossing/catching ball 1 minute  BP 138/91  BP109

## 2017-12-07 NOTE — Therapy (Signed)
[facility] [address] [place], [place], [address] Phone: [phone]   Fax:  [phone]  Occupational Therapy Treatment  Patient Details  Name: Thomas Howard MRN: [id number] Date of Birth: 06/15/1960 Referring Provider: Eustaquio BoydenJavier Gutierrez, MD   Encounter Date: 12/07/2017  OT End of Session - 12/07/17 0924    Visit Number  10    Number of Visits  25    Date for OT Re-Evaluation  01/13/18    Authorization Type  [employer] - 60 visits combined for OT/PT, if occurs same day counts as one visit    Authorization - Visit Number  10    Authorization - Number of Visits  30    OT Start Time  0845    OT Stop Time  0930    OT Time Calculation (min)  45 min       Past Medical History:  Diagnosis Date  . History of smoking   . HTN (hypertension)   . Hyperlipemia   . Obesity   . Wears partial dentures     Past Surgical History:  Procedure Laterality Date  . MOUTH SURGERY    . WISDOM TOOTH EXTRACTION      Vitals:   12/07/17 0856  BP: 138/84                Treatment: Environmental scanning, 88% located on first pass. Pt located  remaining items on second pass. Standing to copy small peg design with LUE, with 2 lbs weight on left wrist for added resistance, min v.c for equal weightbearing through LE's, pt copied design correctly with increased time required. Arm bike level 5  X 6 mins  For conditioning, pt maintained 40 rpm          OT Education - 12/07/17 0927    Education Details  Putty green for LUE grip, see pt instructions    Person(s) Educated  Patient    Methods  Explanation;Demonstration    Comprehension  Verbalized understanding;Returned demonstration       OT Short Term Goals - 12/07/17 0858      OT SHORT TERM GOAL #5   Title  Pt will perfrom basic environmental scanning with 80% or better accuracy in prep for driving.    Status  Achieved 88% for basic      OT SHORT TERM GOAL #6   Title  Pt will demonstrate ability to retrieve a lightweight object at 130* shoulder flexion without dropping with LUE     Status  Achieved      OT SHORT TERM GOAL #7   Title  further assess cognitve skills and set additional goal PRN    Status  Deferred        OT Long Term Goals - 12/07/17 [id number]      OT LONG TERM GOAL #1   Title  I with updated HEP. due 01/20/18 date extended due to missed week    Time  12    Period  Weeks    Status  On-going      OT LONG TERM GOAL #2   Title  Pt will demonstrate improved fine motor coordination  for ADLs as evidenced by decreasing 9 hole peg test score to 38 secs or less.    Baseline  RUE 26.94 sec, LUE 44.97 secs    Time  12    Period  Weeks    Status  On-going      OT LONG TERM GOAL #3  Title  Pt will perform simple cooking task modified indpendently demonstrating good safety awareness.    Time  12    Period  Weeks    Status  On-going      OT LONG TERM GOAL #4   Title  Pt will perfrom tabletop and environmental scanning activities with 90% or better accurary in prep for ADLs and work activities.    Time  12    Period  Weeks    Status  On-going      OT LONG TERM GOAL #5   Title  Pt will demonstrate adequate LUE strength and control to retrieve a 5 lbs weight from mid level shelf x 5 reps without drops.    Time  12    Period  Weeks    Status  On-going            Plan - 12/07/17 1610    Clinical Impression Statement  Pt is progressing towards goals. He demonstrates improved environmental scanning today.    Occupational performance deficits (Please refer to evaluation for details):  ADL's;IADL's;Work;Leisure;Social Participation    Rehab Potential  Good    Current Impairments/barriers affecting progress:  severity of deficits    OT Frequency  2x / week    OT Duration  8 weeks    OT Treatment/Interventions  Self-care/ADL training;Electrical Stimulation;Therapeutic exercise;Visual/perceptual remediation/compensation;Coping  strategies training;Patient/family education;Splinting;Neuromuscular education;Paraffin;Moist Heat;Fluidtherapy;Energy conservation;Therapeutic activities;Cognitive remediation/compensation;Balance training;Passive range of motion;Manual Therapy;DME and/or AE instruction;Contrast Bath;Ultrasound;Cryotherapy    Plan  continue to address LUE strength, endurance, visual perceptual skills       Patient will benefit from skilled therapeutic intervention in order to improve the following deficits and impairments:  Abnormal gait, Decreased cognition, Decreased knowledge of use of DME, Impaired flexibility, Impaired vision/preception, Decreased mobility, Decreased coordination, Decreased activity tolerance, Decreased endurance, Decreased range of motion, Decreased strength, Impaired UE functional use, Impaired perceived functional ability, Difficulty walking, Decreased safety awareness, Decreased knowledge of precautions, Decreased balance  Visit Diagnosis: Muscle weakness (generalized)  Visuospatial deficit  Cognitive social or emotional deficit following cerebral infarction  Other lack of coordination    Problem List Patient Active Problem List   Diagnosis Date Noted  . Essential hypertension 11/30/2017  . Lacunar stroke of right subthalamic region (HCC) 11/30/2017  . Occlusion and stenosis of left carotid artery 11/30/2017  . Hemiplegia and hemiparesis following cerebral infarction affecting left non-dominant side (HCC) 11/20/2017  . Vertigo 11/20/2017  . Uncontrolled type 2 diabetes mellitus with neurologic complication, without long-term current use of insulin (HCC) 10/17/2017  . History of hemorrhagic cerebrovascular accident (CVA) with residual deficit 09/30/2017  . OSA (obstructive sleep apnea) 09/30/2017  . Smoker 09/30/2017  . 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 Health care maintenance 06/20/2013  . Severe obesity (BMI 35.0-39.9) with comorbidity  (HCC) 12/15/2011  . Skin rash 07/07/2011  . HTN (hypertension)     RINE,KATHRYN 12/07/2017, 9:28 AM Keene Breath, OTR/L [phone] Phone: [phone] 12:26 PM 12/07/17 [facility] [address] [place], [place], [address] Phone: [phone]   Fax:  [phone]  Name: Thomas Howard MRN: [id number] Date of Birth: [birth year]

## 2017-12-07 NOTE — Telephone Encounter (Signed)
Copied from CRM [id number]. Topic: Quick Communication - See Telephone Encounter >> Dec 07, 2017  2:50 PM Terisa Starraylor, Brittany L wrote: CRM for notification. See Telephone encounter for: 12/07/17.  [facility] called for a new script for amLODipine (NORVASC) 10 MG tablet.    [id number] - [place], [place] - [address]

## 2017-12-07 NOTE — Therapy (Signed)
[facility] [address] [place], [place], [address] Phone: [phone]   Fax:  [phone]  Physical Therapy Treatment  Patient Details  Name: Thomas Howard MRN: [id number] Date of Birth: 1960/10/07 Referring Provider: Eustaquio Boyden, MD   Encounter Date: 12/07/2017  PT End of Session - 12/07/17 1216    Visit Number  10    Number of Visits  26    Date for PT Re-Evaluation  01/14/18    Authorization Type  [employer] 60 visit limit combined PT & OT, counts as 1 visit if seen on same day.  $20 co-pay    Authorization - Visit Number  16 counting PT, OT & speech, counts as 1 if seen on same day    Authorization - Number of Visits  60    PT Start Time  1015    PT Stop Time  1100    PT Time Calculation (min)  45 min    Equipment Utilized During Treatment  Gait belt    Activity Tolerance  Patient tolerated treatment well    Behavior During Therapy  [facility] for tasks assessed/performed       Past Medical History:  Diagnosis Date  . History of smoking   . HTN (hypertension)   . Hyperlipemia   . Obesity   . Wears partial dentures     Past Surgical History:  Procedure Laterality Date  . MOUTH SURGERY    . WISDOM TOOTH EXTRACTION      Vitals:   12/07/17 1024  BP: (!) 150/92  Pulse: 90    Subjective Assessment - 12/07/17 1015    Subjective  No falls. He did not do much activity or exercise over the weekend.     Pertinent History  HTN, + smoker,    Limitations  Lifting;Standing;Walking;House hold activities    Patient Stated Goals  To not be so fatigued with every day activities.     Currently in Pain?  No/denies      PT session consisted on 6 minutes work, 3 minutes rest, cycles with PT monitoring BP after each 6 minute work and educating during rest periods.  Rest BP 150/92, HR 90 6 minutes of gait including ramp & stairs, pushing /pulling weight cart 20' 5 reps & 3 floor transfers with support: BP 124/73, HR  100 Rest 3 min 6 minutes: lifting 25# crate & carrying 50' 6 reps & leg press BLE 100# 15 reps.  BP 129/77, HR 98 Rest 3 min 6 minutes: gait with variable cadence & scanning, pulling rope with resistance for 4 minutes  BP 109/75  HR 100 Rest 3 min 6 min walking outdoors including grass slope, tossing/catching ball 1 minute  BP 138/91  BP109                           PT Short Term Goals - 12/07/17 1221      PT SHORT TERM GOAL #1   Title  Patient verbalizes & demonstrates exercise program to safely perform at local fitness including if BP in safe range. (All STGs Target Date: 12/17/2017)    Time  4    Period  Weeks    Status  On-going    Target Date  12/17/17      PT SHORT TERM GOAL #2   Title  Patient tolerates 30 minutes of moderate intensity activities with </= 2 seated rests for <3 minutes.     Time  4    Period  Weeks    Status  On-going    Target Date  12/17/17      PT SHORT TERM GOAL #3   Title  Patient lifts 35# box & carries 70' for 10 reps with cues on body mechanics only.     Time  4    Period  Weeks    Status  On-going    Target Date  12/17/17      PT SHORT TERM GOAL #4   Title  Patient pushes/pulls weight cart 50' 20 reps with verbal cues on technique / body mechanics.     Time  4    Period  Weeks    Status  On-going    Target Date  12/17/17        PT Long Term Goals - 12/07/17 1221      PT LONG TERM GOAL #1   Title  Patient demonstrates & verbalizes understanding of ongoing fitness plan /  HEP. (All updated LTGs Target  Date: 01/14/2018)    Time  8    Period  Weeks    Status  On-going    Target Date  01/14/18      PT LONG TERM GOAL #2   Title  Patient verbalizes safe BP for moderate to high intensity activites like fitness program or work.     Time  8    Period  Weeks    Status  On-going    Target Date  01/14/18      PT LONG TERM GOAL #3   Title  Patient tolerates 40 minutes of standing activities of moderate intensity without  seated rests.    Time  8    Period  Weeks    Status  On-going    Target Date  01/14/18      PT LONG TERM GOAL #4   Title  Functional Gait Assessment >/= 28/30    Time  8    Period  Weeks    Status  On-going    Target Date  01/14/18      PT LONG TERM GOAL #5   Title  Patient lifts 100# crate floor to knee height surface 3 reps and lifts 55# / carries 75' 10 reps with proper body mechanics safely    Time  8    Period  Weeks    Status  On-going    Target Date  01/14/18      PT LONG TERM GOAL #6   Title  Patient pushes & pulls up to 125# average, pushes & pulls weighted cart 50' X 20 reps safely.     Time  8    Period  Weeks    Status  On-going    Target Date  01/14/18            Plan - 12/07/17 1222    Clinical Impression Statement  Patient had good BP response to exercises & activities today. PT instructed pt that BP is effected by medications, dehydration, salt intake, cigarette smoking & stress.     Rehab Potential  Good    Clinical Impairments Affecting Rehab Potential  LLE weakness, high fall risk, significant other has noticed impaired processing verbal instructions    PT Frequency  2x / week    PT Duration  8 weeks    PT Treatment/Interventions  ADLs/Self Care Home Management;Canalith Repostioning;DME Instruction;Gait training;Stair training;Functional mobility training;Therapeutic activities;Therapeutic exercise;Balance training;Neuromuscular re-education;Patient/family education;Vestibular    PT Next Visit Plan  Check BP prior & during PT, lifting, carrying, pushing, pulling & gait /balance with increasing time tolerates moderate activities    Consulted and Agree with Plan of Care  Patient       Patient will benefit from skilled therapeutic intervention in order to improve the following deficits and impairments:  Abnormal gait, Decreased activity tolerance, Decreased balance, Decreased endurance, Decreased coordination, Decreased mobility, Decreased strength,  Dizziness, Postural dysfunction  Visit Diagnosis: Muscle weakness (generalized)  Other abnormalities of gait and mobility  Unsteadiness on feet  Abnormal posture  Paralytic gait     Problem List Patient Active Problem List   Diagnosis Date Noted  . Essential hypertension 11/30/2017  . Lacunar stroke of right subthalamic region (HCC) 11/30/2017  . Occlusion and stenosis of left carotid artery 11/30/2017  . Hemiplegia and hemiparesis following cerebral infarction affecting left non-dominant side (HCC) 11/20/2017  . Vertigo 11/20/2017  . Uncontrolled type 2 diabetes mellitus with neurologic complication, without long-term current use of insulin (HCC) 10/17/2017  . History of hemorrhagic cerebrovascular accident (CVA) with residual deficit 09/30/2017  . OSA (obstructive sleep apnea) 09/30/2017  . Smoker 09/30/2017  . 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 Health care maintenance 06/20/2013  . Severe obesity (BMI 35.0-39.9) with comorbidity (HCC) 12/15/2011  . Skin rash 07/07/2011  . HTN (hypertension)     Khristine Verno PT, DPT 12/07/2017, 12:25 PM  [facility] [address] [place], [place], [address] Phone: [phone]   Fax:  [phone]  Name: Antionette FairyDarvin Birky MRN: [id number] Date of Birth: [birth year]

## 2017-12-07 NOTE — Patient Instructions (Signed)
1. Grip Strengthening (Resistive Putty)   Squeeze putty using thumb and all fingers. Repeat _20___ times. Do __2__ sessions per day.   2. Roll putty into tube on table and pinch between each finger and thumb x 10 reps each. (can do ring and small finger together)     Copyright  VHI. All rights reserved.   

## 2017-12-07 NOTE — Therapy (Signed)
[facility] [address] [place], [place], [address] Phone: [phone]   Fax:  [phone]  Speech Language Pathology Treatment  Patient Details  Name: Thomas Howard MRN: [id number] Date of Birth: March 03, 1961 Referring Provider: Eustaquio Boyden, MD   Encounter Date: 12/07/2017  End of Session - 12/07/17 1017    Visit Number  6    Number of Visits  17    Date for SLP Re-Evaluation  12/31/17    SLP Start Time  0933    SLP Stop Time   1015    SLP Time Calculation (min)  42 min    Activity Tolerance  Patient tolerated treatment well       Past Medical History:  Diagnosis Date  . History of smoking   . HTN (hypertension)   . Hyperlipemia   . Obesity   . Wears partial dentures     Past Surgical History:  Procedure Laterality Date  . MOUTH SURGERY    . WISDOM TOOTH EXTRACTION      There were no vitals filed for this visit.  Subjective Assessment - 12/07/17 0937    Subjective  "I went on a cruise a week ago."    Currently in Pain?  No/denies            ADULT SLP TREATMENT - 12/07/17 0935      General Information   Behavior/Cognition  Alert;Cooperative;Pleasant mood      Treatment Provided   Treatment provided  Cognitive-Linquistic      Pain Assessment   Pain Assessment  No/denies pain      Cognitive-Linquistic Treatment   Treatment focused on  Cognition    Skilled Treatment  SLP reviewed pt's homework with him, which was 90% accurate; min verbal cues required for attention to detail. SLP focused session on dysarthria. Pt reported he continues to get frequent requests to repeat himself. Phrase level tasks 95% accuracy for use of strategies with occasional min A for carryover of SLOP strategies in conversation between tasks. In simple conversation, pt required frequent min-mod A, particularly for slow rate, louder speech.       Assessment / Recommendations / Plan   Plan  Continue with current  plan of care      Progression Toward Goals   Progression toward goals  Progressing toward goals         SLP Short Term Goals - 12/07/17 6962      SLP SHORT TERM GOAL #1   Title  pt will undergo cognitive linguistic testing in the first 3 sessions if necessary    Status  Achieved      SLP SHORT TERM GOAL #2   Title  pt will demo HEP for dysarthria (oral strength, and compensations) with occasional min A x4 sessions    Time  1    Period  Weeks    Status  On-going      SLP SHORT TERM GOAL #3   Title  pt will use dysarthria compensations in 8 minutes simple conversation to allow for 100% overall intelligibility x2 sessions    Time  1    Period  Weeks    Status  On-going      SLP SHORT TERM GOAL #4   Title  pt will use dysarthria compensations for 18/20 sentence responses x2 sessions    Time  1    Period  Weeks    Status  On-going      SLP SHORT TERM GOAL #5  Title  pt will use WNL loudness in 8 minutes simple conversation x3 sessions    Time  1    Period  Weeks    Status  On-going      SLP SHORT TERM GOAL #6   Title  Pt will alternate attention between 2 simple cognitive lingusitic tasks with 90% on each    Time  1    Period  Weeks    Status  On-going       SLP Long Term Goals - 12/07/17 1019      SLP LONG TERM GOAL #1   Title  pt will demo dysarthria HEP withrare min A x3 visits    Time  5    Period  Weeks    Status  On-going      SLP LONG TERM GOAL #2   Title  pt will demo 100% overall intelligibliity in 10 minutes simple to mod complex conversation with modified inedpendence x3 sessions    Time  5    Period  Weeks    Status  On-going      SLP LONG TERM GOAL #3   Title  pt will use average 70dB volume in 10 minutes simple to mod complex converastion x2 sessions    Time  5    Period  Weeks    Status  On-going      SLP LONG TERM GOAL #4   Title  Pt will altenrate attention between 2 moderately complex cognitive linguistic tasks with occasional min A  and 85% on each    Time  5    Period  Weeks    Status  On-going      SLP LONG TERM GOAL #5   Title  Pt will correct errors 3/5 on cognitive linguistic tasks with rare min A over 2 sessions    Time  5    Period  Weeks    Status  On-going       Plan - 12/07/17 1018    Clinical Impression Statement  Decreased attention to detail noted today. Pt required frequent cues to maintain use of compensations for dysarthria in simple conversation today. Continue skilled ST to maximize intellgilbity and cognition for indepedence and safety.     Speech Therapy Frequency  2x / week    Treatment/Interventions  Environmental controls;Cueing hierarchy;SLP instruction and feedback;Oral motor exercises;Compensatory strategies;Functional tasks;Cognitive reorganization;Multimodal communcation approach;Internal/external aids;Patient/family education    Potential to Achieve Goals  Good    Potential Considerations  Cooperation/participation level    Consulted and Agree with Plan of Care  Patient       Patient will benefit from skilled therapeutic intervention in order to improve the following deficits and impairments:   Dysarthria and anarthria    Problem List Patient Active Problem List   Diagnosis Date Noted  . Essential hypertension 11/30/2017  . Lacunar stroke of right subthalamic region (HCC) 11/30/2017  . Occlusion and stenosis of left carotid artery 11/30/2017  . Hemiplegia and hemiparesis following cerebral infarction affecting left non-dominant side (HCC) 11/20/2017  . Vertigo 11/20/2017  . Uncontrolled type 2 diabetes mellitus with neurologic complication, without long-term current use of insulin (HCC) 10/17/2017  . History of hemorrhagic cerebrovascular accident (CVA) with residual deficit 09/30/2017  . OSA (obstructive sleep apnea) 09/30/2017  . Smoker 09/30/2017  . 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 Health care maintenance 06/20/2013  . Severe obesity  (BMI 35.0-39.9) with comorbidity (HCC) 12/15/2011  . Skin rash 07/07/2011  .  HTN (hypertension)    Rondel Baton, MS, CCC-SLP Speech-Language Pathologist  Arlana Lindau 12/07/2017, 10:19 AM  [facility] [address] [place], [place], [address] Phone: [phone]   Fax:  [phone]   Name: Thomas Howard MRN: [id number] Date of Birth: Sep 04, 1960

## 2017-12-08 MED ORDER — AMLODIPINE BESYLATE 10 MG PO TABS: 10.0000 mg | ORAL_TABLET | Freq: Every day | ORAL | 0 refills | Status: DC

## 2017-12-08 NOTE — Telephone Encounter (Signed)
Pt last seen 11/16/17 and has f/u appt 12/14/17. Refilled per protocol. [facility] and [facility] any remaining refills on amlodipine 2.5 mg and 5 mg.

## 2017-12-10 ENCOUNTER — Encounter: Payer: Self-pay | Admitting: Family Medicine

## 2017-12-10 ENCOUNTER — Ambulatory Visit: Payer: BLUE CROSS/BLUE SHIELD | Admitting: Physical Therapy

## 2017-12-10 ENCOUNTER — Ambulatory Visit: Payer: BLUE CROSS/BLUE SHIELD | Attending: Family Medicine

## 2017-12-10 ENCOUNTER — Ambulatory Visit: Payer: BLUE CROSS/BLUE SHIELD | Admitting: Occupational Therapy

## 2017-12-10 ENCOUNTER — Encounter: Payer: Self-pay | Admitting: Physical Therapy

## 2017-12-10 VITALS — BP 145/81 | HR 112

## 2017-12-10 DIAGNOSIS — R2681 Unsteadiness on feet: Secondary | ICD-10-CM | POA: Insufficient documentation

## 2017-12-10 DIAGNOSIS — R41841 Cognitive communication deficit: Secondary | ICD-10-CM | POA: Diagnosis not present

## 2017-12-10 DIAGNOSIS — R2689 Other abnormalities of gait and mobility: Secondary | ICD-10-CM

## 2017-12-10 DIAGNOSIS — I69315 Cognitive social or emotional deficit following cerebral infarction: Secondary | ICD-10-CM | POA: Diagnosis not present

## 2017-12-10 DIAGNOSIS — R278 Other lack of coordination: Secondary | ICD-10-CM | POA: Insufficient documentation

## 2017-12-10 DIAGNOSIS — R293 Abnormal posture: Secondary | ICD-10-CM | POA: Diagnosis not present

## 2017-12-10 DIAGNOSIS — R471 Dysarthria and anarthria: Secondary | ICD-10-CM | POA: Diagnosis not present

## 2017-12-10 DIAGNOSIS — R41842 Visuospatial deficit: Secondary | ICD-10-CM | POA: Insufficient documentation

## 2017-12-10 DIAGNOSIS — R261 Paralytic gait: Secondary | ICD-10-CM | POA: Insufficient documentation

## 2017-12-10 DIAGNOSIS — M6281 Muscle weakness (generalized): Secondary | ICD-10-CM | POA: Insufficient documentation

## 2017-12-10 NOTE — Patient Instructions (Signed)

## 2017-12-10 NOTE — Therapy (Signed)
[facility] [address] [place], [place], [address] Phone: [phone]   Fax:  [phone]  Speech Language Pathology Treatment  Patient Details  Name: Thomas Howard MRN: [id number] Date of Birth: November 29, 1960 Referring Provider: Ria Bush, MD   Encounter Date: 12/10/2017  End of Session - 12/10/17 1716    Visit Number  7    Number of Visits  17    Date for SLP Re-Evaluation  12/31/17    SLP Start Time  0933    SLP Stop Time   8242    SLP Time Calculation (min)  42 min    Activity Tolerance  Patient tolerated treatment well       Past Medical History:  Diagnosis Date  . History of smoking   . HTN (hypertension)   . Hyperlipemia   . Obesity   . Wears partial dentures     Past Surgical History:  Procedure Laterality Date  . MOUTH SURGERY    . WISDOM TOOTH EXTRACTION      There were no vitals filed for this visit.  Subjective Assessment - 12/10/17 0939    Subjective  "I forgot the homework."    Currently in Pain?  No/denies            ADULT SLP TREATMENT - 12/10/17 0941      General Information   Behavior/Cognition  Alert;Cooperative;Pleasant mood      Treatment Provided   Treatment provided  Cognitive-Linquistic      Cognitive-Linquistic Treatment   Treatment focused on  Cognition    Skilled Treatment  Because of pt's "s" statement SLP educated/re-educated pt about memory compensations. SLP provided examples of each compensation, and pt and SLP discussed how pt could use some of the compensations in his life outside of rehab. Pt appeared  enthusiastic about incorporating 2-3 of the copmensations. Pt had difficulty alternating back to correct place on handout (to read aloud) after conversation with SLP.      Assessment / Recommendations / Plan   Plan  Continue with current plan of care      Progression Toward Goals   Progression toward goals  Progressing toward goals       SLP  Education - 12/10/17 1716    Education Details  memory compensations, practical ways to incorporate them    Person(s) Educated  Patient    Methods  Explanation;Demonstration    Comprehension  Verbalized understanding;Need further instruction       SLP Short Term Goals - 12/10/17 1718      SLP SHORT TERM GOAL #1   Title  pt will undergo cognitive linguistic testing in the first 3 sessions if necessary    Status  Achieved      SLP SHORT TERM GOAL #2   Title  pt will demo HEP for dysarthria (oral strength, and compensations) with occasional min A x4 sessions    Status  Partially Met      SLP SHORT TERM GOAL #3   Title  pt will use dysarthria compensations in 8 minutes simple conversation to allow for 100% overall intelligibility x2 sessions    Status  Partially Met      SLP SHORT TERM GOAL #4   Title  pt will use dysarthria compensations for 18/20 sentence responses x2 sessions    Status  Achieved      SLP SHORT TERM GOAL #5   Title  pt will use WNL loudness in 8 minutes simple conversation x3 sessions  Status  Partially Met      SLP SHORT TERM GOAL #6   Title  Pt will alternate attention between 2 simple cognitive lingusitic tasks with 90% on each    Status  Not Met       SLP Long Term Goals - 12/10/17 1719      SLP LONG TERM GOAL #1   Title  pt will demo dysarthria HEP withrare min A x3 visits    Time  5    Period  Weeks    Status  On-going      SLP LONG TERM GOAL #2   Title  pt will demo 100% overall intelligibliity in 10 minutes simple to mod complex conversation with modified inedpendence x3 sessions    Time  5    Period  Weeks    Status  On-going      SLP LONG TERM GOAL #3   Title  pt will use average 70dB volume in 10 minutes simple to mod complex converastion x2 sessions    Time  5    Period  Weeks    Status  On-going      SLP LONG TERM GOAL #4   Title  Pt will altenrate attention between 2 moderately complex cognitive linguistic tasks with occasional  min A and 85% on each    Time  5    Period  Weeks    Status  On-going      SLP LONG TERM GOAL #5   Title  Pt will correct errors 3/5 on cognitive linguistic tasks with rare min A over 2 sessions    Time  5    Period  Weeks    Status  On-going       Plan - 12/10/17 1717    Clinical Impression Statement  Pt demonstrated impaired memory and planning/organiation today. Forgot homework. SLP educated pt on memory strategies. See "skilled intervention" for details. Continue skilled ST to maximize intellgilbity and cognition for indepedence and safety.     Speech Therapy Frequency  2x / week    Duration  -- 8 weeks/17 visits    Treatment/Interventions  Environmental controls;Cueing hierarchy;SLP instruction and feedback;Oral motor exercises;Compensatory strategies;Functional tasks;Cognitive reorganization;Multimodal communcation approach;Internal/external aids;Patient/family education    Potential to Achieve Goals  Good    Potential Considerations  Cooperation/participation level    Consulted and Agree with Plan of Care  Patient       Patient will benefit from skilled therapeutic intervention in order to improve the following deficits and impairments:   Cognitive communication deficit  Dysarthria and anarthria    Problem List Patient Active Problem List   Diagnosis Date Noted  . Essential hypertension 11/30/2017  . Lacunar stroke of right subthalamic region ([place]) 11/30/2017  . Occlusion and stenosis of left carotid artery 11/30/2017  . Hemiplegia and hemiparesis following cerebral infarction affecting left non-dominant side ([place]) 11/20/2017  . Vertigo 11/20/2017  . Uncontrolled type 2 diabetes mellitus with neurologic complication, without long-term current use of insulin ([place]) 10/17/2017  . History of hemorrhagic cerebrovascular accident (CVA) with residual deficit 09/30/2017  . OSA (obstructive sleep apnea) 09/30/2017  . Smoker 09/30/2017  . Occlusion and stenosis of left  anterior cerebral artery 09/30/2017  . Health care maintenance 06/20/2013  . Severe obesity (BMI 35.0-39.9) with comorbidity ([place]) 12/15/2011  . Skin rash 07/07/2011  . HTN (hypertension)     [place] ,MS, CCC-SLP  12/10/2017, 5:19 PM  [facility] [address]  [place], [place], [address] Phone: [phone]   Fax:  [phone]   Name: Legacy Lacivita MRN: [id number] Date of Birth: 19-May-1960

## 2017-12-10 NOTE — Therapy (Signed)
[facility] [address] [place], [place], [address] Phone: [phone]   Fax:  [phone]  Physical Therapy Treatment  Patient Details  Name: Unknown Schleyer MRN: [id number] Date of Birth: 03-10-1961 Referring Provider: Eustaquio Boyden, MD   Encounter Date: 12/10/2017  PT End of Session - 12/10/17 1019    Visit Number  11    Number of Visits  26    Date for PT Re-Evaluation  01/14/18    Authorization Type  [employer] 60 visit limit combined PT & OT, counts as 1 visit if seen on same day.  $20 co-pay    Authorization - Visit Number  17 counting PT, OT & speech, counts as 1 if seen on same day    Authorization - Number of Visits  60    PT Start Time  1018    PT Stop Time  1102    PT Time Calculation (min)  44 min    Equipment Utilized During Treatment  Gait belt    Activity Tolerance  Patient tolerated treatment well    Behavior During Therapy  WFL for tasks assessed/performed       Past Medical History:  Diagnosis Date  . History of smoking   . HTN (hypertension)   . Hyperlipemia   . Obesity   . Wears partial dentures     Past Surgical History:  Procedure Laterality Date  . MOUTH SURGERY    . WISDOM TOOTH EXTRACTION      Vitals:   12/10/17 1027 12/10/17 1040 12/10/17 1054 12/10/17 1103  BP: (!) 182/94 (!) 144/81 132/83 (!) 145/81  Pulse: (!) 104 (!) 106 (!) 105 (!) 112    Subjective Assessment - 12/10/17 1017    Subjective  No new complaints. No falls.     Patient is accompained by:  Family member GF April in lobby    Pertinent History  HTN, + smoker,    Limitations  Lifting;Standing;Walking;House hold activities    Patient Stated Goals  To not be so fatigued with every day activities.     Currently in Pain?  No/denies          [facility] Adult PT Treatment/Exercise - 12/10/17 1028      Exercises   Exercises  Other Exercises    Other Exercises   pushing/then pulling fully loaded weight cart for 20 feet  each way x 6 reps, followed by legpress with bil LE's at 100# for 15 reps with 5 sec holds each rep. rest break for 2-3 mintues for recovery with vitals assessed. up/down 4 stairs reciprocally with intermittent touch to rails x 6 reps, followed by lifting 25# crate from floor to carry for 60 feet and setting it down on floor, for 4 reps. rest after with vitals assessed.       Lumbar Exercises: Aerobic   Tread Mill  with 2 hand hold: fwd walking at 1.6 mph, then side stepping at 0.6 mph x 1 minute each way, then walking down hill at 1.0 mph x 2 minutes          PT Short Term Goals - 12/07/17 1221      PT SHORT TERM GOAL #1   Title  Patient verbalizes & demonstrates exercise program to safely perform at local fitness including if BP in safe range. (All STGs Target Date: 12/17/2017)    Time  4    Period  Weeks    Status  On-going    Target Date  12/17/17  PT SHORT TERM GOAL #2   Title  Patient tolerates 30 minutes of moderate intensity activities with </= 2 seated rests for <3 minutes.     Time  4    Period  Weeks    Status  On-going    Target Date  12/17/17      PT SHORT TERM GOAL #3   Title  Patient lifts 35# box & carries 5650' for 10 reps with cues on body mechanics only.     Time  4    Period  Weeks    Status  On-going    Target Date  12/17/17      PT SHORT TERM GOAL #4   Title  Patient pushes/pulls weight cart 50' 20 reps with verbal cues on technique / body mechanics.     Time  4    Period  Weeks    Status  On-going    Target Date  12/17/17        PT Long Term Goals - 12/07/17 1221      PT LONG TERM GOAL #1   Title  Patient demonstrates & verbalizes understanding of ongoing fitness plan /  HEP. (All updated LTGs Target  Date: 01/14/2018)    Time  8    Period  Weeks    Status  On-going    Target Date  01/14/18      PT LONG TERM GOAL #2   Title  Patient verbalizes safe BP for moderate to high intensity activites like fitness program or work.     Time  8     Period  Weeks    Status  On-going    Target Date  01/14/18      PT LONG TERM GOAL #3   Title  Patient tolerates 40 minutes of standing activities of moderate intensity without seated rests.    Time  8    Period  Weeks    Status  On-going    Target Date  01/14/18      PT LONG TERM GOAL #4   Title  Functional Gait Assessment >/= 28/30    Time  8    Period  Weeks    Status  On-going    Target Date  01/14/18      PT LONG TERM GOAL #5   Title  Patient lifts 100# crate floor to knee height surface 3 reps and lifts 55# / carries 4250' 10 reps with proper body mechanics safely    Time  8    Period  Weeks    Status  On-going    Target Date  01/14/18      PT LONG TERM GOAL #6   Title  Patient pushes & pulls up to 125# average, pushes & pulls weighted cart 50' X 20 reps safely.     Time  8    Period  Weeks    Status  On-going    Target Date  01/14/18            Plan - 12/10/17 1019    Clinical Impression Statement  Today's skilled session continued to address high level balance, strengthening, activity tolerance and work simulation activities with rest breaks/vitals monitored. Pt with initial elevated BP that decreased with rest/water and remained stable with remainder of session. Pt is making steady progress toward goals and should benefit from continued PT to progress toward unmet goals.     Rehab Potential  Good    Clinical Impairments Affecting Rehab Potential  LLE weakness, high fall risk, significant other has noticed impaired processing verbal instructions    PT Frequency  2x / week    PT Duration  8 weeks    PT Treatment/Interventions  ADLs/Self Care Home Management;Canalith Repostioning;DME Instruction;Gait training;Stair training;Functional mobility training;Therapeutic activities;Therapeutic exercise;Balance training;Neuromuscular re-education;Patient/family education;Vestibular    PT Next Visit Plan  Check BP prior & during PT, lifting, carrying, pushing, pulling & gait  /balance with increasing time tolerates moderate activities    Consulted and Agree with Plan of Care  Patient       Patient will benefit from skilled therapeutic intervention in order to improve the following deficits and impairments:  Abnormal gait, Decreased activity tolerance, Decreased balance, Decreased endurance, Decreased coordination, Decreased mobility, Decreased strength, Dizziness, Postural dysfunction  Visit Diagnosis: Muscle weakness (generalized)  Other abnormalities of gait and mobility  Unsteadiness on feet  Abnormal posture  Paralytic gait     Problem List Patient Active Problem List   Diagnosis Date Noted  . Essential hypertension 11/30/2017  . Lacunar stroke of right subthalamic region (HCC) 11/30/2017  . Occlusion and stenosis of left carotid artery 11/30/2017  . Hemiplegia and hemiparesis following cerebral infarction affecting left non-dominant side (HCC) 11/20/2017  . Vertigo 11/20/2017  . Uncontrolled type 2 diabetes mellitus with neurologic complication, without long-term current use of insulin (HCC) 10/17/2017  . History of hemorrhagic cerebrovascular accident (CVA) with residual deficit 09/30/2017  . OSA (obstructive sleep apnea) 09/30/2017  . Smoker 09/30/2017  . 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 Health care maintenance 06/20/2013  . Severe obesity (BMI 35.0-39.9) with comorbidity (HCC) 12/15/2011  . Skin rash 07/07/2011  . HTN (hypertension)     Sallyanne Kuster, PTA, [facility] [address], [address] [place], [place] [address] [phone] 12/10/17, 12:53 PM   Name: Jakhai Fant MRN: [id number] Date of Birth: 12-17-1960

## 2017-12-11 NOTE — Telephone Encounter (Signed)
See pt email. Letter in chart. plz fax to requested #.

## 2017-12-13 NOTE — Telephone Encounter (Signed)
Letter was printed and faxed to fax # provided in message.

## 2017-12-14 ENCOUNTER — Encounter: Payer: Self-pay | Admitting: Family Medicine

## 2017-12-14 ENCOUNTER — Ambulatory Visit: Payer: BLUE CROSS/BLUE SHIELD | Admitting: Physical Therapy

## 2017-12-14 ENCOUNTER — Ambulatory Visit: Payer: BLUE CROSS/BLUE SHIELD

## 2017-12-14 ENCOUNTER — Other Ambulatory Visit: Payer: Self-pay | Admitting: Family Medicine

## 2017-12-14 ENCOUNTER — Ambulatory Visit: Payer: BLUE CROSS/BLUE SHIELD | Admitting: Occupational Therapy

## 2017-12-14 ENCOUNTER — Ambulatory Visit: Payer: BLUE CROSS/BLUE SHIELD | Admitting: Family Medicine

## 2017-12-14 VITALS — BP 124/78 | HR 99 | Temp 98.4°F | Ht 68.0 in | Wt 234.5 lb

## 2017-12-14 DIAGNOSIS — G4733 Obstructive sleep apnea (adult) (pediatric): Secondary | ICD-10-CM | POA: Diagnosis not present

## 2017-12-14 DIAGNOSIS — E1165 Type 2 diabetes mellitus with hyperglycemia: Secondary | ICD-10-CM

## 2017-12-14 DIAGNOSIS — I10 Essential (primary) hypertension: Secondary | ICD-10-CM | POA: Diagnosis not present

## 2017-12-14 DIAGNOSIS — IMO0002 Reserved for concepts with insufficient information to code with codable children: Secondary | ICD-10-CM

## 2017-12-14 DIAGNOSIS — I69354 Hemiplegia and hemiparesis following cerebral infarction affecting left non-dominant side: Secondary | ICD-10-CM

## 2017-12-14 DIAGNOSIS — I6930 Unspecified sequelae of cerebral infarction: Secondary | ICD-10-CM

## 2017-12-14 DIAGNOSIS — E1149 Type 2 diabetes mellitus with other diabetic neurological complication: Secondary | ICD-10-CM

## 2017-12-14 LAB — POCT GLUCOSE (DEVICE FOR HOME USE): POC Glucose: 249 mg/dl — AB (ref 70–99)

## 2017-12-14 MED ORDER — ONETOUCH ULTRA 2 W/DEVICE KIT: PACK | 0 refills | Status: AC

## 2017-12-14 MED ORDER — ONETOUCH ULTRASOFT LANCETS MISC: 3 refills | Status: AC

## 2017-12-14 MED ORDER — GLUCOSE BLOOD VI STRP: ORAL_STRIP | 3 refills | Status: AC

## 2017-12-14 NOTE — Assessment & Plan Note (Addendum)
Chronic, improved on amlodipine - continue current regimen.

## 2017-12-14 NOTE — Patient Instructions (Addendum)
Take one more week of plavix (clopidogrel) then may just transition to aspirin 81mg  daily.  Labs today - if sugar remains high, we may start diabetes medicine called metformin.  I want to prescribe glucose meter for you to start monitoring fasting sugars at home. May touch base with pharmacy when you pick up the meter. cbg today.  Touch base with pharmacy or insurance to see whant your preferred glucose meter brand is and let me know and I will send this in to your pharmacy. I have sent in one touch meter - but make sure this is ok with your insurance before picking up.  We will refer you to diabetes classes.  Schedule diabetes eye exam.  Return in 1 month for follow up visit.

## 2017-12-14 NOTE — Assessment & Plan Note (Signed)
Reviewed pathophysiology of type 2 diabetes as well as treatment and management strategies. Encouraged low sugar low carb diabetic diet. Will refer to diabetes education. Recommended he schedule eye exam. Check fructosamine today and discussed if elevated will recommend starting metformin. Reviewed common side effects namely GI sxs to expect. RTC 1 mo f/u visit.

## 2017-12-14 NOTE — Assessment & Plan Note (Signed)
Appreciate neuro care - pending split night sleep study.

## 2017-12-14 NOTE — Assessment & Plan Note (Signed)
Continues PT/OT/ST

## 2017-12-14 NOTE — Assessment & Plan Note (Signed)
Continues PT/OT/ST 

## 2017-12-14 NOTE — Progress Notes (Signed)
BP 124/78 (BP Location: Left Arm, Patient Position: Sitting, Cuff Size: Large)   Pulse 99   Temp 98.4 F (36.9 C) (Oral)   Ht _0  (1.727 m)   Wt 234 lb 8 oz (106.4 kg)   SpO2 98%   BMI 35.66 kg/m    CC: 1 mo f/u visit Subjective:    Patient ID: Thomas Howard, male    DOB: May 09, 1961, 57 y.o.   MRN: [id number]  HPI: Thomas Howard is a 57 y.o. male presenting on 12/14/2017 for Hypertension (Here for f/u.)   See prior note for details.   H/o CVA - saw [facility] for this along with OSA - notes reviewed. Pending split night sleep study. Dr Leta Baptist recommended DAPT x 3 months post stroke then decrease to aspirin 29m daily. Stroke 09/22/2017. Progressing well with physical and occupational and speech therapy.   Last visit we started amlodipine 542mfor better bp control - significant improvement at home and in office. Headaches are better. Fatigue and dizziness is improving.   DM - does not regularly check sugars. Denies low sugars or hypoglycemic symptoms. Denies paresthesias. Last diabetic eye exam DUE. Pneumovax: due. Prevnar: not due. Glucometer brand: unknown. DSME: will refer. Lab Results  Component Value Date   HGBA1C 9.9 (H) 10/14/2017   Diabetic Foot Exam - Simple   Simple Foot Form Diabetic Foot exam was performed with the following findings:  Yes 12/14/2017  9:39 AM  Visual Inspection See comments:  Yes Sensation Testing Intact to touch and monofilament testing bilaterally:  Yes Pulse Check Posterior Tibialis and Dorsalis pulse intact bilaterally:  Yes Comments Itchy skin scaling at bilateral soles Thickened elongated nails.    No results found for: MIDerl [place] Relevant past medical, surgical, family and social history reviewed and updated as indicated. Interim medical history since our last visit reviewed. Allergies and medications reviewed and updated. Outpatient Medications Prior to Visit  Medication Sig Dispense Refill  . amLODipine (NORVASC) 10 MG  tablet Take 1 tablet (10 mg total) by mouth daily. 90 tablet 0  . aspirin 81 MG EC tablet Take 81 mg by mouth daily.  0  . atorvastatin (LIPITOR) 80 MG tablet Take 1 tablet (80 mg total) by mouth at bedtime. 90 tablet 3  . clopidogrel (PLAVIX) 75 MG tablet Take 1 tablet (75 mg total) by mouth daily. 90 tablet 3  . hydrochlorothiazide (HYDRODIURIL) 50 MG tablet Take 0.5 tablets (25 mg total) by mouth daily.    . Marland Kitchenisinopril (PRINIVIL,ZESTRIL) 40 MG tablet Take 1 tablet (40 mg total) by mouth daily. 90 tablet 3  . nicotine (NICODERM CQ - DOSED IN MG/24 HOURS) 21 mg/24hr patch Place 21 mg onto the skin daily.     No facility-administered medications prior to visit.      Per HPI unless specifically indicated in ROS section below Review of Systems     Objective:    BP 124/78 (BP Location: Left Arm, Patient Position: Sitting, Cuff Size: Large)   Pulse 99   Temp 98.4 F (36.9 C) (Oral)   Ht _1  (1.727 m)   Wt 234 lb 8 oz (106.4 kg)   SpO2 98%   BMI 35.66 kg/m   Wt Readings from Last 3 Encounters:  12/14/17 234 lb 8 oz (106.4 kg)  12/01/17 237 lb 3.2 oz (107.6 kg)  11/30/17 237 lb (107.5 kg)    Physical Exam  Constitutional: He appears well-developed and well-nourished. No distress.  HENT:  Head: Normocephalic and  atraumatic.  Right Ear: External ear normal.  Left Ear: External ear normal.  Nose: Nose normal.  Mouth/Throat: Oropharynx is clear and moist. No oropharyngeal exudate.  Eyes: Pupils are equal, round, and reactive to light. Conjunctivae and EOM are normal. No scleral icterus.  Neck: Normal range of motion. Neck supple.  Cardiovascular: Normal rate, regular rhythm, normal heart sounds and intact distal pulses.  No murmur heard. Pulmonary/Chest: Effort normal and breath sounds normal. No respiratory distress. He has no wheezes. He has no rales.  Musculoskeletal: He exhibits no edema.  See HPI for foot exam if done  Lymphadenopathy:    He has no cervical adenopathy.    Skin: Skin is warm and dry. No rash noted.  Psychiatric: He has a normal mood and affect.  Nursing note and vitals reviewed.  Results for orders placed or performed in visit on 10/62/69  Basic metabolic panel  Result Value Ref Range   Sodium 134 (L) 135 - 145 mEq/L   Potassium 3.3 (L) 3.5 - 5.1 mEq/L   Chloride 98 96 - 112 mEq/L   CO2 28 19 - 32 mEq/L   Glucose, Bld 288 (H) 70 - 99 mg/dL   BUN 23 6 - 23 mg/dL   Creatinine, Ser 1.04 0.40 - 1.50 mg/dL   Calcium 9.9 8.4 - 10.5 mg/dL   GFR 95.44 >60.00 mL/min  Hemoglobin A1c  Result Value Ref Range   Hgb A1c MFr Bld 9.9 (H) 4.6 - 6.5 %      Assessment & Plan:   Problem List Items Addressed This Visit    Uncontrolled type 2 diabetes mellitus with neurologic complication, without long-term current use of insulin ([place]) - Primary    Reviewed pathophysiology of type 2 diabetes as well as treatment and management strategies. Encouraged low sugar low carb diabetic diet. Will refer to diabetes education. Recommended he schedule eye exam. Check fructosamine today and discussed if elevated will recommend starting metformin. Reviewed common side effects namely GI sxs to expect. RTC 1 mo f/u visit.       Relevant Orders   Fructosamine   POCT Glucose (Device for Home Use)   Ambulatory referral to diabetic education   OSA (obstructive sleep apnea)    Appreciate neuro care - pending split night sleep study.      HTN (hypertension)    Chronic, improved on amlodipine - continue current regimen.       History of hemorrhagic cerebrovascular accident (CVA) with residual deficit    Continues PT/OT/ST      Hemiplegia and hemiparesis following cerebral infarction affecting left non-dominant side (HCC)    Continues PT/OT/ST          Meds ordered this encounter  Medications  . glucose blood (ONE TOUCH ULTRA TEST) test strip    Sig: Use as instructed to check sugars BID E11.49    Dispense:  100 each    Refill:  3  . Lancets (ONETOUCH  ULTRASOFT) lancets    Sig: Use as instructed to check sugars BID E11.49    Dispense:  100 each    Refill:  3  . Blood Glucose Monitoring Suppl (ONE TOUCH ULTRA 2) w/Device KIT    Sig: Use as directed E11.49    Dispense:  1 each    Refill:  0   Orders Placed This Encounter  Procedures  . Fructosamine  . Ambulatory referral to diabetic education    Referral Priority:   Routine    Referral Type:   Consultation  Referral Reason:   Specialty Services Required    Number of Visits Requested:   1  . POCT Glucose (Device for Home Use)    Follow up plan: Return in about 1 month (around 01/11/2018) for follow up visit.  Ria Bush, MD

## 2017-12-16 LAB — FRUCTOSAMINE: FRUCTOSAMINE: 391 umol/L — AB (ref 190–270)

## 2017-12-17 ENCOUNTER — Ambulatory Visit: Payer: BLUE CROSS/BLUE SHIELD

## 2017-12-17 ENCOUNTER — Ambulatory Visit: Payer: BLUE CROSS/BLUE SHIELD | Admitting: Occupational Therapy

## 2017-12-17 ENCOUNTER — Other Ambulatory Visit: Payer: Self-pay | Admitting: Family Medicine

## 2017-12-17 ENCOUNTER — Ambulatory Visit: Payer: BLUE CROSS/BLUE SHIELD | Admitting: Rehabilitation

## 2017-12-17 ENCOUNTER — Encounter: Payer: Self-pay | Admitting: Rehabilitation

## 2017-12-17 VITALS — BP 131/76

## 2017-12-17 DIAGNOSIS — R293 Abnormal posture: Secondary | ICD-10-CM | POA: Diagnosis not present

## 2017-12-17 DIAGNOSIS — M6281 Muscle weakness (generalized): Secondary | ICD-10-CM

## 2017-12-17 DIAGNOSIS — R278 Other lack of coordination: Secondary | ICD-10-CM

## 2017-12-17 DIAGNOSIS — R261 Paralytic gait: Secondary | ICD-10-CM | POA: Diagnosis not present

## 2017-12-17 DIAGNOSIS — I69315 Cognitive social or emotional deficit following cerebral infarction: Secondary | ICD-10-CM | POA: Diagnosis not present

## 2017-12-17 DIAGNOSIS — R41842 Visuospatial deficit: Secondary | ICD-10-CM | POA: Diagnosis not present

## 2017-12-17 DIAGNOSIS — R471 Dysarthria and anarthria: Secondary | ICD-10-CM

## 2017-12-17 DIAGNOSIS — R41841 Cognitive communication deficit: Secondary | ICD-10-CM | POA: Diagnosis not present

## 2017-12-17 DIAGNOSIS — R2689 Other abnormalities of gait and mobility: Secondary | ICD-10-CM | POA: Diagnosis not present

## 2017-12-17 DIAGNOSIS — R2681 Unsteadiness on feet: Secondary | ICD-10-CM | POA: Diagnosis not present

## 2017-12-17 MED ORDER — METFORMIN HCL 500 MG PO TABS: 500.0000 mg | ORAL_TABLET | Freq: Two times a day (BID) | ORAL | 11 refills | Status: DC

## 2017-12-17 NOTE — Therapy (Signed)
[facility] [address] [place], [place], [address] Phone: [phone]   Fax:  [phone]  Speech Language Pathology Treatment  Patient Details  Name: Thomas Howard MRN: [id number] Date of Birth: 1960-05-21 Referring Provider: Ria Bush, MD   Encounter Date: 12/17/2017  End of Session - 12/17/17 1150    Visit Number  8    Number of Visits  17    Date for SLP Re-Evaluation  12/31/17    SLP Start Time  0933    SLP Stop Time   1016    SLP Time Calculation (min)  43 min    Activity Tolerance  Patient tolerated treatment well       Past Medical History:  Diagnosis Date  . History of smoking   . HTN (hypertension)   . Hyperlipemia   . Obesity   . Wears partial dentures     Past Surgical History:  Procedure Laterality Date  . MOUTH SURGERY    . WISDOM TOOTH EXTRACTION      There were no vitals filed for this visit.  Subjective Assessment - 12/17/17 0952    Subjective  "I just won't talk" (pt, re: his dysarthria)    Currently in Pain?  No/denies            ADULT SLP TREATMENT - 12/17/17 0953      General Information   Behavior/Cognition  Alert;Cooperative;Pleasant mood      Treatment Provided   Treatment provided  Cognitive-Linquistic      Cognitive-Linquistic Treatment   Treatment focused on  Cognition;Dysarthria    Skilled Treatment  (cognition skills 23 minutes) SLP reviewed pt homework and he had limited error awareness of his two errors made; req'd SLP cues, but pt found his error when SLP told him of incorrect response.  (Speech tx) SLP reviewed pt's dysarthria HEP with him; pt stated he has only completed one exercise of the HEP. SLP re-educated pt about rationale for HEP and pt thanked SLP stating he didn't know the reason behind HEP and that was the reason he was not completing it regularly.       Assessment / Recommendations / Plan   Plan  Continue with current plan of care      Progression Toward Goals   Progression toward goals  Progressing toward goals       SLP Education - 12/17/17 1149    Education Details  dysarthria HEP, rationale for dysarthria HEP    Person(s) Educated  Patient    Methods  Explanation;Demonstration;Verbal cues;Handout    Comprehension  Verbalized understanding;Returned demonstration;Need further instruction;Verbal cues required       SLP Short Term Goals - 12/10/17 1718      SLP SHORT TERM GOAL #1   Title  pt will undergo cognitive linguistic testing in the first 3 sessions if necessary    Status  Achieved      SLP SHORT TERM GOAL #2   Title  pt will demo HEP for dysarthria (oral strength, and compensations) with occasional min A x4 sessions    Status  Partially Met      SLP SHORT TERM GOAL #3   Title  pt will use dysarthria compensations in 8 minutes simple conversation to allow for 100% overall intelligibility x2 sessions    Status  Partially Met      SLP SHORT TERM GOAL #4   Title  pt will use dysarthria compensations for 18/20 sentence responses x2 sessions  Status  Achieved      SLP SHORT TERM GOAL #5   Title  pt will use WNL loudness in 8 minutes simple conversation x3 sessions    Status  Partially Met      SLP SHORT TERM GOAL #6   Title  Pt will alternate attention between 2 simple cognitive lingusitic tasks with 90% on each    Status  Not Met       SLP Long Term Goals - 12/17/17 1151      SLP LONG TERM GOAL #1   Title  pt will demo dysarthria HEP withrare min A x3 visits    Time  4    Period  Weeks    Status  On-going      SLP LONG TERM GOAL #2   Title  pt will demo 100% overall intelligibliity in 10 minutes simple to mod complex conversation with modified inedpendence x3 sessions    Time  4    Period  Weeks    Status  On-going      SLP LONG TERM GOAL #3   Title  pt will use average 70dB volume in 10 minutes simple to mod complex converastion x2 sessions    Baseline  12-17-17    Time  4    Period   Weeks    Status  On-going      SLP LONG TERM GOAL #4   Title  Pt will altenrate attention between 2 moderately complex cognitive linguistic tasks with occasional min A and 85% on each    Time  4    Period  Weeks    Status  On-going      SLP LONG TERM GOAL #5   Title  Pt will correct errors 3/5 on cognitive linguistic tasks with rare min A over 2 sessions    Time  3    Period  Weeks    Status  On-going       Plan - 12/17/17 1150    Clinical Impression Statement  Decreased attention to detail noted today. SLP reviewed pt's HEP for dysarthria which pt endorsed suboptimal completion of scope and frequency, rationale for HEP provided by SLP assisted pt in understanding why he was completing.  Continue skilled ST to maximize intellgilbity and cognition for indepedence and safety.     Speech Therapy Frequency  2x / week    Duration  --   8 weeks/17 visits   Treatment/Interventions  Environmental controls;Cueing hierarchy;SLP instruction and feedback;Oral motor exercises;Compensatory strategies;Functional tasks;Cognitive reorganization;Multimodal communcation approach;Internal/external aids;Patient/family education    Potential to Achieve Goals  Good    Potential Considerations  Cooperation/participation level    Consulted and Agree with Plan of Care  Patient       Patient will benefit from skilled therapeutic intervention in order to improve the following deficits and impairments:   Dysarthria and anarthria  Cognitive communication deficit    Problem List Patient Active Problem List   Diagnosis Date Noted  . Lacunar stroke of right subthalamic region ([place]) 11/30/2017  . Occlusion and stenosis of left carotid artery 11/30/2017  . Hemiplegia and hemiparesis following cerebral infarction affecting left non-dominant side ([place]) 11/20/2017  . Vertigo 11/20/2017  . Uncontrolled type 2 diabetes mellitus with neurologic complication, without long-term current use of insulin ([place])  10/17/2017  . History of hemorrhagic cerebrovascular accident (CVA) with residual deficit 09/30/2017  . OSA (obstructive sleep apnea) 09/30/2017  . Smoker 09/30/2017  . Occlusion and stenosis of left anterior cerebral  artery 09/30/2017  . Health care maintenance 06/20/2013  . Severe obesity (BMI 35.0-39.9) with comorbidity ([place]) 12/15/2011  . Skin rash 07/07/2011  . HTN (hypertension)     [place] ,MS, CCC-SLP  12/17/2017, 11:52 AM  [facility] [address] [place], [place], [address] Phone: [phone]   Fax:  [phone]   Name: Thomas Howard MRN: [id number] Date of Birth: 05-12-1960

## 2017-12-17 NOTE — Therapy (Signed)
[facility] [address] [place], [place], [address] Phone: [phone]   Fax:  [phone]  Occupational Therapy Treatment  Patient Details  Name: Thomas Howard MRN: [id number] Date of Birth: January 21, 1961 Referring Provider: Eustaquio BoydenJavier Gutierrez, MD   Encounter Date: 12/17/2017  OT End of Session - 12/17/17 0929    Visit Number  11    Number of Visits  25    Date for OT Re-Evaluation  01/13/18    Authorization Type  BC[employer] - 60 visits combined for OT/PT, if occurs same day counts as one visit    Authorization - Visit Number  11    Authorization - Number of Visits  30    OT Start Time  0850    OT Stop Time  0930    OT Time Calculation (min)  40 min       Past Medical History:  Diagnosis Date  . History of smoking   . HTN (hypertension)   . Hyperlipemia   . Obesity   . Wears partial dentures     Past Surgical History:  Procedure Laterality Date  . MOUTH SURGERY    . WISDOM TOOTH EXTRACTION      Vitals:   12/17/17 0854  BP: 131/76               Reviewed HEP in supine and prone, and red therband min v.c intially for postioning, then pt returned demonstration.    Supine Shoulder Press - 8 reps - 2 sets - with 10 lbs weight  Supine Alternating Shoulder Flexion - 8 reps - 2 sets- with 4 lbs weight each hand  Prone Shoulder Extension - 8 reps - 2 sets - 4 lbs weight  biceps curls, shoulder flexion with red theraband, min v.c for positioning Arm bike x 5 mins level 5 for conditioning.        OT Education - 12/17/17 [phone]    Education Details  Reviewed supine, prone strengthening and red theraband for shoulder flexion/ biceps curls     Person(s) Educated  Patient    Methods  Explanation;Demonstration    Comprehension  Verbalized understanding;Returned demonstration       OT Short Term Goals - 12/07/17 0858      OT SHORT TERM GOAL #5   Title  Pt will perfrom basic environmental scanning  with 80% or better accuracy in prep for driving.    Status  Achieved   88% for basic     OT SHORT TERM GOAL #6   Title  Pt will demonstrate ability to retrieve a lightweight object at 130* shoulder flexion without dropping with LUE     Status  Achieved      OT SHORT TERM GOAL #7   Title  further assess cognitve skills and set additional goal PRN    Status  Deferred        OT Long Term Goals - 12/17/17 0856      OT LONG TERM GOAL #1   Title  I with updated HEP. due 01/20/18 date extended due to missed week    Time  12    Period  Weeks    Status  On-going      OT LONG TERM GOAL #2   Title  Pt will demonstrate improved fine motor coordination  for ADLs as evidenced by decreasing 9 hole peg test score to 38 secs or less.    Baseline  RUE 26.94 sec, LUE 44.97 secs  Time  12    Period  Weeks    Status  Achieved   33.47     OT LONG TERM GOAL #3   Title  Pt will perform simple cooking task modified indpendently demonstrating good safety awareness.    Time  12    Period  Weeks    Status  On-going      OT LONG TERM GOAL #4   Title  Pt will perfrom tabletop and environmental scanning activities with 90% or better accurary in prep for ADLs and work activities.    Time  12    Period  Weeks    Status  On-going      OT LONG TERM GOAL #5   Title  Pt will demonstrate adequate LUE strength and control to retrieve a 5 lbs weight from mid level shelf x 5 reps without drops.    Time  12    Period  Weeks    Status  On-going            Plan - 12/17/17 0929    Clinical Impression Statement  Pt is progressing towards goals. He demonstrates improving LUE strength, coordination and functional use.    Rehab Potential  Good    OT Frequency  2x / week    OT Duration  8 weeks    OT Treatment/Interventions  Self-care/ADL training;Electrical Stimulation;Therapeutic exercise;Visual/perceptual remediation/compensation;Coping strategies training;Patient/family  education;Splinting;Neuromuscular education;Paraffin;Moist Heat;Fluidtherapy;Energy conservation;Therapeutic activities;Cognitive remediation/compensation;Balance training;Passive range of motion;Manual Therapy;DME and/or AE instruction;Contrast Bath;Ultrasound;Cryotherapy    Plan  continue to address LUE strength, endurance, visual perceptual skills       Patient will benefit from skilled therapeutic intervention in order to improve the following deficits and impairments:  Abnormal gait, Decreased cognition, Decreased knowledge of use of DME, Impaired flexibility, Impaired vision/preception, Decreased mobility, Decreased coordination, Decreased activity tolerance, Decreased endurance, Decreased range of motion, Decreased strength, Impaired UE functional use, Impaired perceived functional ability, Difficulty walking, Decreased safety awareness, Decreased knowledge of precautions, Decreased balance  Visit Diagnosis: Muscle weakness (generalized)  Other lack of coordination  Visuospatial deficit    Problem List Patient Active Problem List   Diagnosis Date Noted  . Lacunar stroke of right subthalamic region (HCC) 11/30/2017  . Occlusion and stenosis of left carotid artery 11/30/2017  . Hemiplegia and hemiparesis following cerebral infarction affecting left non-dominant side (HCC) 11/20/2017  . Vertigo 11/20/2017  . Uncontrolled type 2 diabetes mellitus with neurologic complication, without long-term current use of insulin (HCC) 10/17/2017  . History of hemorrhagic cerebrovascular accident (CVA) with residual deficit 09/30/2017  . OSA (obstructive sleep apnea) 09/30/2017  . Smoker 09/30/2017  . 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 Health care maintenance 06/20/2013  . Severe obesity (BMI 35.0-39.9) with comorbidity (HCC) 12/15/2011  . Skin rash 07/07/2011  . HTN (hypertension)     Thomas Howard 12/17/2017, 9:30 AM  [facility] [address] [place], [place], [address] Phone: [phone]   Fax:  [phone]  Name: Thomas Howard MRN: [id number] Date of Birth: 05/31/1960

## 2017-12-17 NOTE — Therapy (Signed)
[facility] [address] [place], [place], [address] Phone: [phone]   Fax:  [phone]  Physical Therapy Treatment  Patient Details  Name: Thomas Howard MRN: [id number] Date of Birth: June 29, 1960 Referring Provider: Ria Bush, MD   Encounter Date: 12/17/2017  PT End of Session - 12/17/17 1019    Visit Number  12    Number of Visits  26    Date for PT Re-Evaluation  01/14/18    Authorization Type  BCBS 60 visit limit combined PT & OT, counts as 1 visit if seen on same day.  $20 co-pay    Authorization - Visit Number  18   counting PT, OT & speech, counts as 1 if seen on same day   Authorization - Number of Visits  60    PT Start Time  1018    PT Stop Time  1100    PT Time Calculation (min)  42 min    Equipment Utilized During Treatment  Gait belt    Activity Tolerance  Patient tolerated treatment well    Behavior During Therapy  WFL for tasks assessed/performed       Past Medical History:  Diagnosis Date  . History of smoking   . HTN (hypertension)   . Hyperlipemia   . Obesity   . Wears partial dentures     Past Surgical History:  Procedure Laterality Date  . MOUTH SURGERY    . WISDOM TOOTH EXTRACTION      There were no vitals filed for this visit.  Subjective Assessment - 12/17/17 1019    Subjective  No changes since last visit.     Pertinent History  HTN, + smoker,    Limitations  Lifting;Standing;Walking;House hold activities    Patient Stated Goals  To not be so fatigued with every day activities.     Currently in Pain?  No/denies                       [facility] Adult PT Treatment/Exercise - 12/17/17 1020      Self-Care   Self-Care  Other Self-Care Comments    Other Self-Care Comments   Discussed concerns with returning to work, as he not only has to be able to complete these very physical tasks, however he has to do them in high heat and under stress.  Feel that this  would elevate BP.  Pt reports not wanting to retire, but maybe working somewhere else.       Therapeutic Activites    Therapeutic Activities  Work [employer]    Work Therapist, sports STGs with work simulation of pushing weighted cart x 50' x 20 reps at [employer] level with min cues for body mechanics.  Also had pt work on lifting/carrying 35# box 50' x 10 reps (lifting and lowering following each rep) with min cues towards the end for improved breath control and body mechanics.  Assessed BP following each task and was 177/84 after pushing cart and then 163/88 following lifting crate.       Neuro Re-ed    Neuro Re-ed Details   Briefly reviewed HEP for balance and increased challenge to having pt perform SLS on pillow x 3 reps of 15 secs each leg, tandem stance on pillow x 3 reps of 20 secs each direction.  Pt unable to do tandem stance on pillow with head turns, therefore maintained on pillow.  Also recommend tandem walking on grass to increase challenge.  Pt return demo and verbalized understanding.              PT Education - 12/17/17 1304    Education Details  progress with STGs.     Person(s) Educated  Patient    Methods  Explanation    Comprehension  Verbalized understanding       PT Short Term Goals - 12/17/17 1020      PT SHORT TERM GOAL #1   Title  Patient verbalizes & demonstrates exercise program to safely perform at local fitness including if BP in safe range. (All STGs Target Date: 12/17/2017)    Baseline  has not returned to gym, but does HEP on regular basis    Time  4    Period  Weeks    Status  Partially Met      PT SHORT TERM GOAL #2   Title  Patient tolerates 30 minutes of moderate intensity activities with </= 2 seated rests for <3 minutes.     Baseline  Per pt report, he is unable to do so at this time    Time  4    Period  Weeks    Status  Not Met      PT SHORT TERM GOAL #3   Title  Patient lifts 35# box & carries 32' for 10 reps with cues on body mechanics  only.     Baseline  met 12/17/17    Time  4    Period  Weeks    Status  Achieved      PT SHORT TERM GOAL #4   Title  Patient pushes/pulls weight cart 50' 20 reps with verbal cues on technique / body mechanics.     Baseline  met 12/17/17    Time  4    Period  Weeks    Status  Achieved        PT Long Term Goals - 12/07/17 1221      PT LONG TERM GOAL #1   Title  Patient demonstrates & verbalizes understanding of ongoing fitness plan /  HEP. (All updated LTGs Target  Date: 01/14/2018)    Time  8    Period  Weeks    Status  On-going    Target Date  01/14/18      PT LONG TERM GOAL #2   Title  Patient verbalizes safe BP for moderate to high intensity activites like fitness program or work.     Time  8    Period  Weeks    Status  On-going    Target Date  01/14/18      PT LONG TERM GOAL #3   Title  Patient tolerates 40 minutes of standing activities of moderate intensity without seated rests.    Time  8    Period  Weeks    Status  On-going    Target Date  01/14/18      PT LONG TERM GOAL #4   Title  Functional Gait Assessment >/= 28/30    Time  8    Period  Weeks    Status  On-going    Target Date  01/14/18      PT LONG TERM GOAL #5   Title  Patient lifts 100# crate floor to knee height surface 3 reps and lifts 55# / carries 50' 10 reps with proper body mechanics safely    Time  8    Period  Weeks    Status  On-going  Target Date  01/14/18      PT LONG TERM GOAL #6   Title  Patient pushes & pulls up to 125# average, pushes & pulls weighted cart 50' X 20 reps safely.     Time  8    Period  Weeks    Status  On-going    Target Date  01/14/18            Plan - 12/17/17 1019    Clinical Impression Statement  Skilled session focused on assessment of STGs. Note pt has met     Rehab Potential  Good    Clinical Impairments Affecting Rehab Potential  LLE weakness, high fall risk, significant other has noticed impaired processing verbal instructions    PT Frequency  2x /  week    PT Duration  8 weeks    PT Treatment/Interventions  ADLs/Self Care Home Management;Canalith Repostioning;DME Instruction;Gait training;Stair training;Functional mobility training;Therapeutic activities;Therapeutic exercise;Balance training;Neuromuscular re-education;Patient/family education;Vestibular    PT Next Visit Plan  Check BP prior & during PT, lifting, carrying, pushing, pulling & gait /balance with increasing time tolerates moderate activities    Consulted and Agree with Plan of Care  Patient       Patient will benefit from skilled therapeutic intervention in order to improve the following deficits and impairments:  Abnormal gait, Decreased activity tolerance, Decreased balance, Decreased endurance, Decreased coordination, Decreased mobility, Decreased strength, Dizziness, Postural dysfunction  Visit Diagnosis: Muscle weakness (generalized)  Other abnormalities of gait and mobility  Unsteadiness on feet     Problem List Patient Active Problem List   Diagnosis Date Noted  . Lacunar stroke of right subthalamic region ([place]) 11/30/2017  . Occlusion and stenosis of left carotid artery 11/30/2017  . Hemiplegia and hemiparesis following cerebral infarction affecting left non-dominant side ([place]) 11/20/2017  . Vertigo 11/20/2017  . Uncontrolled type 2 diabetes mellitus with neurologic complication, without long-term current use of insulin ([place]) 10/17/2017  . History of hemorrhagic cerebrovascular accident (CVA) with residual deficit 09/30/2017  . OSA (obstructive sleep apnea) 09/30/2017  . Smoker 09/30/2017  . 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 Health care maintenance 06/20/2013  . Severe obesity (BMI 35.0-39.9) with comorbidity ([place]) 12/15/2011  . Skin rash 07/07/2011  . HTN (hypertension)     Cameron Sprang, PT, MPT [facility] [address] [place], [place], [address] Phone: [phone]    Fax:  [phone] 12/17/17, 1:07 PM  Name: Thomas Howard MRN: [id number] Date of Birth: Aug 04, 1960

## 2017-12-21 ENCOUNTER — Encounter: Payer: Self-pay | Admitting: Physical Therapy

## 2017-12-21 ENCOUNTER — Encounter: Payer: Self-pay | Admitting: Occupational Therapy

## 2017-12-21 ENCOUNTER — Ambulatory Visit: Payer: BLUE CROSS/BLUE SHIELD | Admitting: Speech Pathology

## 2017-12-21 ENCOUNTER — Ambulatory Visit: Payer: BLUE CROSS/BLUE SHIELD | Admitting: Physical Therapy

## 2017-12-21 ENCOUNTER — Ambulatory Visit: Payer: BLUE CROSS/BLUE SHIELD | Admitting: Occupational Therapy

## 2017-12-21 VITALS — BP 130/84 | HR 85

## 2017-12-21 VITALS — BP 153/90

## 2017-12-21 DIAGNOSIS — R41842 Visuospatial deficit: Secondary | ICD-10-CM | POA: Diagnosis not present

## 2017-12-21 DIAGNOSIS — R2689 Other abnormalities of gait and mobility: Secondary | ICD-10-CM

## 2017-12-21 DIAGNOSIS — R471 Dysarthria and anarthria: Secondary | ICD-10-CM

## 2017-12-21 DIAGNOSIS — R41841 Cognitive communication deficit: Secondary | ICD-10-CM

## 2017-12-21 DIAGNOSIS — R2681 Unsteadiness on feet: Secondary | ICD-10-CM | POA: Diagnosis not present

## 2017-12-21 DIAGNOSIS — R278 Other lack of coordination: Secondary | ICD-10-CM

## 2017-12-21 DIAGNOSIS — R261 Paralytic gait: Secondary | ICD-10-CM | POA: Diagnosis not present

## 2017-12-21 DIAGNOSIS — M6281 Muscle weakness (generalized): Secondary | ICD-10-CM

## 2017-12-21 DIAGNOSIS — R293 Abnormal posture: Secondary | ICD-10-CM | POA: Diagnosis not present

## 2017-12-21 DIAGNOSIS — I69315 Cognitive social or emotional deficit following cerebral infarction: Secondary | ICD-10-CM | POA: Diagnosis not present

## 2017-12-21 NOTE — Therapy (Signed)
[facility] [address] [place], [place], [address] Phone: [phone]   Fax:  [phone]  Physical Therapy Treatment  Patient Details  Name: Thomas Howard MRN: [id number] Date of Birth: 07/27/1960 Referring Provider: Ria Bush, MD   Encounter Date: 12/21/2017  PT End of Session - 12/21/17 1155    Visit Number  13    Number of Visits  26    Date for PT Re-Evaluation  01/14/18    Authorization Type  BCBS 60 visit limit combined PT & OT, counts as 1 visit if seen on same day.  $20 co-pay    Authorization - Visit Number  19   counting PT, OT & speech, counts as 1 if seen on same day   Authorization - Number of Visits  60    PT Start Time  1100    PT Stop Time  1147    PT Time Calculation (min)  47 min    Equipment Utilized During Treatment  Gait belt    Activity Tolerance  Patient tolerated treatment well    Behavior During Therapy  WFL for tasks assessed/performed       Past Medical History:  Diagnosis Date  . History of smoking   . HTN (hypertension)   . Hyperlipemia   . Obesity   . Wears partial dentures     Past Surgical History:  Procedure Laterality Date  . MOUTH SURGERY    . WISDOM TOOTH EXTRACTION       Subjective Assessment - 12/21/17 1104    Subjective  He does not think that he can go back to work due to environment with heat, work load over long time. He has been talking to MD about disability.    Pertinent History  HTN, + smoker,    Limitations  Lifting;Standing;Walking;House hold activities    Patient Stated Goals  To not be so fatigued with every day activities.     Currently in Pain?  No/denies          Vitals:   12/21/17 1116 12/21/17 1117  BP: (!) 144/93 130/84  Pulse: 80 85   10 minutes work simulation:  Pushing/Pulling weight cart 20' X 10, pulling resistance cord for 3 minutes, stairs carrying 10# crate 2 minutes, 3 floor transfers with UE assist on mat table. BP  142/80  HR 109 3 minute seated rest 12 minutes work simulation: Outdoors in 95* heat, ambulated 1500' including up/down grass slope 5 reps and over obstacle 6 reps; indoor in [facility] lifting & carrying 40# box 90' X 5 BP 150/76 HR 128                        PT Education - 12/21/17 1115    Education Details  disability based on physical & cognitive limitations, age & education level (he has high school diploma with no post high school education)    Person(s) Educated  Patient    Methods  Explanation    Comprehension  Verbalized understanding       PT Short Term Goals - 12/17/17 1020      PT SHORT TERM GOAL #1   Title  Patient verbalizes & demonstrates exercise program to safely perform at local fitness including if BP in safe range. (All STGs Target Date: 12/17/2017)    Baseline  has not returned to gym, but does HEP on regular basis    Time  4    Period  Weeks  Status  Partially Met      PT SHORT TERM GOAL #2   Title  Patient tolerates 30 minutes of moderate intensity activities with </= 2 seated rests for <3 minutes.     Baseline  Per pt report, he is unable to do so at this time    Time  4    Period  Weeks    Status  Not Met      PT SHORT TERM GOAL #3   Title  Patient lifts 35# box & carries 40' for 10 reps with cues on body mechanics only.     Baseline  met 12/17/17    Time  4    Period  Weeks    Status  Achieved      PT SHORT TERM GOAL #4   Title  Patient pushes/pulls weight cart 50' 20 reps with verbal cues on technique / body mechanics.     Baseline  met 12/17/17    Time  4    Period  Weeks    Status  Achieved        PT Long Term Goals - 12/07/17 1221      PT LONG TERM GOAL #1   Title  Patient demonstrates & verbalizes understanding of ongoing fitness plan /  HEP. (All updated LTGs Target  Date: 01/14/2018)    Time  8    Period  Weeks    Status  On-going    Target Date  01/14/18      PT LONG TERM GOAL #2   Title  Patient verbalizes safe BP for  moderate to high intensity activites like fitness program or work.     Time  8    Period  Weeks    Status  On-going    Target Date  01/14/18      PT LONG TERM GOAL #3   Title  Patient tolerates 40 minutes of standing activities of moderate intensity without seated rests.    Time  8    Period  Weeks    Status  On-going    Target Date  01/14/18      PT LONG TERM GOAL #4   Title  Functional Gait Assessment >/= 28/30    Time  8    Period  Weeks    Status  On-going    Target Date  01/14/18      PT LONG TERM GOAL #5   Title  Patient lifts 100# crate floor to knee height surface 3 reps and lifts 55# / carries 13' 10 reps with proper body mechanics safely    Time  8    Period  Weeks    Status  On-going    Target Date  01/14/18      PT LONG TERM GOAL #6   Title  Patient pushes & pulls up to 125# average, pushes & pulls weighted cart 50' X 20 reps safely.     Time  8    Period  Weeks    Status  On-going    Target Date  01/14/18            Plan - 12/21/17 1155    Clinical Impression Statement  Patient is going to work towards disability as performing moderate to heavy intensity physical activity in hot work environment for 8-10 hour shifts does not appear physically possible. He tolerated increased time today with 75% in [facility] room at 70* and 25% gait activities outdoors in 95* heat. Patient only tolerated  22 minutes of moderate activity with 3 minutes seated rest. PT was third therapy today. OT was first then less active speech therapy followed by 45 minutes break sitting in waiting room. His BP was too high initially for physical work but was able to lower it with mental imagery to destress.     Rehab Potential  Good    Clinical Impairments Affecting Rehab Potential  LLE weakness, high fall risk, significant other has noticed impaired processing verbal instructions    PT Frequency  2x / week    PT Duration  8 weeks    PT Treatment/Interventions  ADLs/Self Care Home  Management;Canalith Repostioning;DME Instruction;Gait training;Stair training;Functional mobility training;Therapeutic activities;Therapeutic exercise;Balance training;Neuromuscular re-education;Patient/family education;Vestibular    PT Next Visit Plan  Check BP prior & during PT, lifting, carrying, pushing, pulling & gait /balance with increasing time tolerates moderate activities    Consulted and Agree with Plan of Care  Patient       Patient will benefit from skilled therapeutic intervention in order to improve the following deficits and impairments:  Abnormal gait, Decreased activity tolerance, Decreased balance, Decreased endurance, Decreased coordination, Decreased mobility, Decreased strength, Dizziness, Postural dysfunction  Visit Diagnosis: Muscle weakness (generalized)  Other abnormalities of gait and mobility  Unsteadiness on feet     Problem List Patient Active Problem List   Diagnosis Date Noted  . Lacunar stroke of right subthalamic region ([place]) 11/30/2017  . Occlusion and stenosis of left carotid artery 11/30/2017  . Hemiplegia and hemiparesis following cerebral infarction affecting left non-dominant side ([place]) 11/20/2017  . Vertigo 11/20/2017  . Uncontrolled type 2 diabetes mellitus with neurologic complication, without long-term current use of insulin ([place]) 10/17/2017  . History of hemorrhagic cerebrovascular accident (CVA) with residual deficit 09/30/2017  . OSA (obstructive sleep apnea) 09/30/2017  . Smoker 09/30/2017  . 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 Health care maintenance 06/20/2013  . Severe obesity (BMI 35.0-39.9) with comorbidity ([place]) 12/15/2011  . Skin rash 07/07/2011  . HTN (hypertension)     Jhoselin Crume PT, DPT 12/21/2017, 12:00 PM  [facility] [address] [place], [place], [address] Phone: [phone]   Fax:  [phone]  Name: Avis Mcmahill MRN: [id number] Date of Birth: 05/03/1961

## 2017-12-21 NOTE — Therapy (Signed)
[facility] [address] [place], [place], [address] Phone: [phone]   Fax:  [phone]  Speech Language Pathology Treatment  Patient Details  Name: Thomas Howard MRN: [id number] Date of Birth: 11/16/1960 Referring Provider: Ria Bush, MD   Encounter Date: 12/21/2017  End of Session - 12/21/17 1005    Visit Number  9    Number of Visits  17    Date for SLP Re-Evaluation  12/31/17    SLP Start Time  0934    SLP Stop Time   7680    SLP Time Calculation (min)  40 min    Activity Tolerance  Patient tolerated treatment well       Past Medical History:  Diagnosis Date  . History of smoking   . HTN (hypertension)   . Hyperlipemia   . Obesity   . Wears partial dentures     Past Surgical History:  Procedure Laterality Date  . MOUTH SURGERY    . WISDOM TOOTH EXTRACTION      There were no vitals filed for this visit.  Subjective Assessment - 12/21/17 0934    Subjective  "I had to cook a scrambled egg this morning"    Currently in Pain?  No/denies            ADULT SLP TREATMENT - 12/21/17 0935      General Information   Behavior/Cognition  Alert;Cooperative;Pleasant mood    Patient Positioning  Upright in chair      Pain Assessment   Pain Assessment  No/denies pain      Cognitive-Linquistic Treatment   Treatment focused on  Cognition;Dysarthria    Skilled Treatment  Speech tx, 23 min: Pt reports completing his HEP "a couple times" since last session; he did not bring his folder today. He told SLP "I understand why I'm doing it now." In short responses vocal intensity was sub WNL despite cues to increase volume. Pt reluctant to increase volume because he "feels like I'm shouting." SLP used audio recording to provide feedback and pt agreed that he did not sound like he was shouting when he felt like he was. In subsequent 10 min conversation pt required occasional mod A for loudness. (Cog tx, 17  min) SLP worked with pt on alternating attention between written cognitive linguistic tasks (simple-mod complex). Pt maintained alternating attention for 9 min before loss of attention, which he noted approximately 1 minute later without cues, continued for an additional 4 minutes without error. Accuracy 100% (3 errors self-corrected) and 90% for written instructions, rare min A for error correction for attention to detail.      Assessment / Recommendations / Plan   Plan  Continue with current plan of care      Progression Toward Goals   Progression toward goals  Progressing toward goals       SLP Education - 12/21/17 1002    Education Details  louder speech does not sound like he is shouting; need to increase vocal effort    Person(s) Educated  Patient    Methods  Explanation;Demonstration;Verbal cues    Comprehension  Verbalized understanding;Verbal cues required;Need further instruction;Returned demonstration       SLP Short Term Goals - 12/21/17 0936      SLP SHORT TERM GOAL #1   Title  pt will undergo cognitive linguistic testing in the first 3 sessions if necessary    Status  Achieved      SLP SHORT TERM GOAL #2  Title  pt will demo HEP for dysarthria (oral strength, and compensations) with occasional min A x4 sessions    Status  Partially Met      SLP SHORT TERM GOAL #3   Title  pt will use dysarthria compensations in 8 minutes simple conversation to allow for 100% overall intelligibility x2 sessions    Status  Partially Met      SLP SHORT TERM GOAL #4   Title  pt will use dysarthria compensations for 18/20 sentence responses x2 sessions    Status  Achieved      SLP SHORT TERM GOAL #5   Title  pt will use WNL loudness in 8 minutes simple conversation x3 sessions    Status  Partially Met      SLP SHORT TERM GOAL #6   Title  Pt will alternate attention between 2 simple cognitive lingusitic tasks with 90% on each    Status  Not Met       SLP Long Term Goals -  12/21/17 7619      SLP LONG TERM GOAL #1   Title  pt will demo dysarthria HEP withrare min A x3 visits    Time  3    Period  Weeks    Status  On-going      SLP LONG TERM GOAL #2   Title  pt will demo 100% overall intelligibliity in 10 minutes simple to mod complex conversation with modified inedpendence x3 sessions    Time  3    Period  Weeks    Status  On-going      SLP LONG TERM GOAL #3   Title  pt will use average 70dB volume in 10 minutes simple to mod complex converastion x2 sessions    Time  3      SLP LONG TERM GOAL #4   Title  Pt will altenrate attention between 2 moderately complex cognitive linguistic tasks with occasional min A and 85% on each    Time  3    Status  On-going      SLP LONG TERM GOAL #5   Title  Pt will correct errors 3/5 on cognitive linguistic tasks with rare min A over 2 sessions    Time  3    Period  Weeks    Status  On-going       Plan - 12/21/17 1003    Clinical Impression Statement  Pt reports increasing frequency of HEP, though still suboptimal completion of scope/frequency. Verbalizes understanding of rationale. Continue skilled ST to maximize intellgilbity and cognition for indepedence and safety.     Speech Therapy Frequency  2x / week    Treatment/Interventions  Environmental controls;Cueing hierarchy;SLP instruction and feedback;Oral motor exercises;Compensatory strategies;Functional tasks;Cognitive reorganization;Multimodal communcation approach;Internal/external aids;Patient/family education    Potential to Achieve Goals  Good    Potential Considerations  Cooperation/participation level    Consulted and Agree with Plan of Care  Patient       Patient will benefit from skilled therapeutic intervention in order to improve the following deficits and impairments:   Dysarthria and anarthria  Cognitive communication deficit    Problem List Patient Active Problem List   Diagnosis Date Noted  . Lacunar stroke of right subthalamic  region ([facility]) 11/30/2017  . Occlusion and stenosis of left carotid artery 11/30/2017  . Hemiplegia and hemiparesis following cerebral infarction affecting left non-dominant side ([facility]) 11/20/2017  . Vertigo 11/20/2017  . Uncontrolled type 2 diabetes mellitus with neurologic complication, without long-term  current use of insulin ([facility]) 10/17/2017  . History of hemorrhagic cerebrovascular accident (CVA) with residual deficit 09/30/2017  . OSA (obstructive sleep apnea) 09/30/2017  . Smoker 09/30/2017  . 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 Health care maintenance 06/20/2013  . Severe obesity (BMI 35.0-39.9) with comorbidity ([facility]) 12/15/2011  . Skin rash 07/07/2011  . HTN (hypertension)    Deneise Lever, [place], CCC-SLP Speech-Language Pathologist  Aliene Altes 12/21/2017, 10:17 AM  [facility] [address] [place], [place], [address] Phone: [phone]   Fax:  [phone]   Name: Nissan Frazzini MRN: [id number] Date of Birth: 21-Jun-1960

## 2017-12-21 NOTE — Patient Instructions (Signed)
Your homework is to do your speech exercises at least twice a day, every day until the next time you come to therapy.

## 2017-12-21 NOTE — Therapy (Signed)
[facility] [address] [place], [place], [address] Phone: [phone]   Fax:  [phone]  Occupational Therapy Treatment  Patient Details  Name: Thomas Howard MRN: [id number] Date of Birth: Sep 05, 1960 Referring Provider: Eustaquio BoydenJavier Gutierrez, MD   Encounter Date: 12/21/2017  OT End of Session - 12/21/17 1729    Visit Number  12    Number of Visits  25    Date for OT Re-Evaluation  01/13/18    Authorization Type  [employer] - 60 visits combined for OT/PT, if occurs same day counts as one visit    Authorization - Visit Number  12    Authorization - Number of Visits  30    OT Start Time  0847    OT Stop Time  0930    OT Time Calculation (min)  43 min       Past Medical History:  Diagnosis Date  . History of smoking   . HTN (hypertension)   . Hyperlipemia   . Obesity   . Wears partial dentures     Past Surgical History:  Procedure Laterality Date  . MOUTH SURGERY    . WISDOM TOOTH EXTRACTION      Vitals:   12/21/17 0850  BP: (!) 153/90    Subjective Assessment - 12/21/17 1728    Subjective   Pt reports he is not planning to return to his previous job    Pertinent History  hospitalized [facility]22[facility]17-09/25/17 at [facility] with acute nonhemorrhagic right basal ganglia lacunar infarct and HTN.    Patient Stated Goals  regain use of LUE    Currently in Pain?  No/denies              Treatment: Basic cooking task  To scramble an egg with supervision. Pt located all items and performed task safely. Therapist encourages pt to perform at home with supervision for safety. Pt was able to clean up after task as well. Gripper set at level 3 for sustained grip to pick up 1 inch blocks, min difficulty and 1 rest break. Graded clothespins for sustained pinch using LUE, min v.c for positioning.                OT Short Term Goals - 12/21/17 1732      OT SHORT TERM GOAL #1   Title  I with inital HEP.  extended due 12/03/17, pt missed last week traveling    Status  Achieved      OT SHORT TERM GOAL #2   Title  Pt will verbalize understanding of compensatory strategies for visual perceptual  impairment    Status  Achieved      OT SHORT TERM GOAL #3   Title  Pt will perform all basic ADLS with modified indpendently with no more than 2 rest breaks    Status  Achieved      OT SHORT TERM GOAL #4   Title  Pt will perform tabletop scanning activities with 80% or better accuracy for improved safety during ADLs.    Status  Achieved      OT SHORT TERM GOAL #5   Title  Pt will perfrom basic environmental scanning with 80% or better accuracy in prep for driving.    Status  Achieved      OT SHORT TERM GOAL #6   Title  Pt will demonstrate ability to retrieve a lightweight object at 130* shoulder flexion without dropping with LUE     Status  Achieved  OT SHORT TERM GOAL #7   Title  further assess cognitve skills and set additional goal PRN    Status  Deferred   Pt is receiving ST       OT Long Term Goals - 12/21/17 1730      OT LONG TERM GOAL #1   Title  I with updated HEP. due 01/20/18 date extended due to missed week    Time  12    Period  Weeks    Status  On-going      OT LONG TERM GOAL #2   Title  Pt will demonstrate improved fine motor coordination  for ADLs as evidenced by decreasing 9 hole peg test score to 38 secs or less.    Baseline  RUE 26.94 sec, LUE 44.97 secs    Time  12    Period  Weeks    Status  Achieved   33.47     OT LONG TERM GOAL #3   Title  Pt will perform simple cooking task modified indpendently demonstrating good safety awareness.    Time  12    Period  Weeks    Status  On-going   Supervision demonstrating good safety awareness     OT LONG TERM GOAL #4   Title  Pt will perfrom tabletop and environmental scanning activities with 90% or better accurary in prep for ADLs and work activities.    Time  12    Period  Weeks    Status  On-going      OT  LONG TERM GOAL #5   Title  Pt will demonstrate adequate LUE strength and control to retrieve a 5 lbs weight from mid level shelf x 5 reps without drops.    Time  12    Period  Weeks    Status  On-going            Plan - 12/21/17 1731    Clinical Impression Statement  Pt is progressing towards goals. He demonstrated good safety awareness with cooking task today.    Rehab Potential  Good    Current Impairments/barriers affecting progress:  severity of deficits    OT Frequency  2x / week    OT Duration  8 weeks    Plan  continue to address LUE strength, endurance, visual perceptual skills    Consulted and Agree with Plan of Care  Patient       Patient will benefit from skilled therapeutic intervention in order to improve the following deficits and impairments:  Abnormal gait, Decreased cognition, Decreased knowledge of use of DME, Impaired flexibility, Impaired vision/preception, Decreased mobility, Decreased coordination, Decreased activity tolerance, Decreased endurance, Decreased range of motion, Decreased strength, Impaired UE functional use, Impaired perceived functional ability, Difficulty walking, Decreased safety awareness, Decreased knowledge of precautions, Decreased balance  Visit Diagnosis: Muscle weakness (generalized)  Other abnormalities of gait and mobility  Visuospatial deficit  Other lack of coordination    Problem List Patient Active Problem List   Diagnosis Date Noted  . Lacunar stroke of right subthalamic region ([facility]) 11/30/2017  . Occlusion and stenosis of left carotid artery 11/30/2017  . Hemiplegia and hemiparesis following cerebral infarction affecting left non-dominant side ([facility]) 11/20/2017  . Vertigo 11/20/2017  . Uncontrolled type 2 diabetes mellitus with neurologic complication, without long-term current use of insulin ([facility]) 10/17/2017  . History of hemorrhagic cerebrovascular accident (CVA) with residual deficit 09/30/2017  . OSA (obstructive  sleep apnea) 09/30/2017  . Smoker 09/30/2017  . Occlusion  and stenosis of left anterior cerebral artery 09/30/2017  . Health care maintenance 06/20/2013  . Severe obesity (BMI 35.0-39.9) with comorbidity ([facility]) 12/15/2011  . Skin rash 07/07/2011  . HTN (hypertension)     RINE,KATHRYN 12/21/2017, 5:33 PM  [facility] [address] [place], [place], [address] Phone: [phone]   Fax:  [phone]  Name: Thomas Howard MRN: [id number] Date of Birth: August 22, 1960

## 2017-12-21 NOTE — Telephone Encounter (Signed)
Pt was seen on 12/14/17.

## 2017-12-24 ENCOUNTER — Ambulatory Visit: Payer: BLUE CROSS/BLUE SHIELD

## 2017-12-28 ENCOUNTER — Ambulatory Visit: Payer: BLUE CROSS/BLUE SHIELD | Admitting: Physical Therapy

## 2017-12-28 ENCOUNTER — Encounter: Payer: Self-pay | Admitting: Physical Therapy

## 2017-12-28 ENCOUNTER — Ambulatory Visit: Payer: BLUE CROSS/BLUE SHIELD

## 2017-12-28 ENCOUNTER — Ambulatory Visit: Payer: BLUE CROSS/BLUE SHIELD | Admitting: Occupational Therapy

## 2017-12-28 VITALS — BP 158/85 | HR 118

## 2017-12-28 VITALS — BP 165/90

## 2017-12-28 DIAGNOSIS — R2681 Unsteadiness on feet: Secondary | ICD-10-CM

## 2017-12-28 DIAGNOSIS — R471 Dysarthria and anarthria: Secondary | ICD-10-CM

## 2017-12-28 DIAGNOSIS — R293 Abnormal posture: Secondary | ICD-10-CM | POA: Diagnosis not present

## 2017-12-28 DIAGNOSIS — R2689 Other abnormalities of gait and mobility: Secondary | ICD-10-CM | POA: Diagnosis not present

## 2017-12-28 DIAGNOSIS — R41842 Visuospatial deficit: Secondary | ICD-10-CM | POA: Diagnosis not present

## 2017-12-28 DIAGNOSIS — M6281 Muscle weakness (generalized): Secondary | ICD-10-CM

## 2017-12-28 DIAGNOSIS — R261 Paralytic gait: Secondary | ICD-10-CM | POA: Diagnosis not present

## 2017-12-28 DIAGNOSIS — R41841 Cognitive communication deficit: Secondary | ICD-10-CM | POA: Diagnosis not present

## 2017-12-28 DIAGNOSIS — R278 Other lack of coordination: Secondary | ICD-10-CM | POA: Diagnosis not present

## 2017-12-28 DIAGNOSIS — I69315 Cognitive social or emotional deficit following cerebral infarction: Secondary | ICD-10-CM | POA: Diagnosis not present

## 2017-12-28 NOTE — Therapy (Signed)
[facility] [address] [place], [place], [address] Phone: [phone]   Fax:  [phone]  Occupational Therapy Treatment  Patient Details  Name: Thomas Howard MRN: [id number] Date of Birth: 1960/09/22 Referring Provider: Eustaquio BoydenJavier Gutierrez, MD   Encounter Date: 12/28/2017  OT End of Session - 12/28/17 1022    Visit Number  13    Number of Visits  25    Date for OT Re-Evaluation  01/13/18    Authorization Type  [employer] - 60 visits combined for OT/PT, if occurs same day counts as one visit    Authorization - Visit Number  13    Authorization - Number of Visits  30    OT Start Time  1020    OT Stop Time  1100    OT Time Calculation (min)  40 min    Activity Tolerance  Patient tolerated treatment well    Behavior During Therapy  [facility] for tasks assessed/performed       Past Medical History:  Diagnosis Date  . History of smoking   . HTN (hypertension)   . Hyperlipemia   . Obesity   . Wears partial dentures     Past Surgical History:  Procedure Laterality Date  . MOUTH SURGERY    . WISDOM TOOTH EXTRACTION      Vitals:   12/28/17 1027  BP: (!) 165/90    Subjective Assessment - 12/28/17 1021    Subjective   Denies pain    Pertinent History  hospital[facility] with acute nonhemorrhagic right basal ganglia lacunar infarct and HTN.    Patient Stated Goals  regain use of LUE    Currently in Pain?  No/denies            Treatment: moderate level environmental scanning to locate items in sequential order, grossly 69 % accuracy. Tabletop activities for alternating attention/ visual perceptual skills, min difficulty. Supine closed chain chest press with bilateral UE's and 10 lbs weight, unilateral shoulder ext in prone with 10 lbs weight, pt required rest break with LUE and he was encouraged to perform less reps if fatigued, min vc..                 OT Short Term  Goals - 12/21/17 1732      OT SHORT TERM GOAL #1   Title  I with inital HEP. extended due 12/03/17, pt missed last week traveling    Status  Achieved      OT SHORT TERM GOAL #2   Title  Pt will verbalize understanding of compensatory strategies for visual perceptual  impairment    Status  Achieved      OT SHORT TERM GOAL #3   Title  Pt will perform all basic ADLS with modified indpendently with no more than 2 rest breaks    Status  Achieved      OT SHORT TERM GOAL #4   Title  Pt will perform tabletop scanning activities with 80% or better accuracy for improved safety during ADLs.    Status  Achieved      OT SHORT TERM GOAL #5   Title  Pt will perfrom basic environmental scanning with 80% or better accuracy in prep for driving.    Status  Achieved      OT SHORT TERM GOAL #6   Title  Pt will demonstrate ability to retrieve a lightweight object at 130* shoulder flexion without dropping with LUE  Status  Achieved      OT SHORT TERM GOAL #7   Title  further assess cognitve skills and set additional goal PRN    Status  Deferred   Pt is receiving ST       OT Long Term Goals - 12/21/17 1730      OT LONG TERM GOAL #1   Title  I with updated HEP. due 01/20/18 date extended due to missed week    Time  12    Period  Weeks    Status  On-going      OT LONG TERM GOAL #2   Title  Pt will demonstrate improved fine motor coordination  for ADLs as evidenced by decreasing 9 hole peg test score to 38 secs or less.    Baseline  RUE 26.94 sec, LUE 44.97 secs    Time  12    Period  Weeks    Status  Achieved   33.47     OT LONG TERM GOAL #3   Title  Pt will perform simple cooking task modified indpendently demonstrating good safety awareness.    Time  12    Period  Weeks    Status  On-going   Supervision demonstrating good safety awareness     OT LONG TERM GOAL #4   Title  Pt will perfrom tabletop and environmental scanning activities with 90% or better accurary in prep for ADLs and  work activities.    Time  12    Period  Weeks    Status  On-going      OT LONG TERM GOAL #5   Title  Pt will demonstrate adequate LUE strength and control to retrieve a 5 lbs weight from mid level shelf x 5 reps without drops.    Time  12    Period  Weeks    Status  On-going              Patient will benefit from skilled therapeutic intervention in order to improve the following deficits and impairments:     Visit Diagnosis: Muscle weakness (generalized)  Other abnormalities of gait and mobility  Unsteadiness on feet  Visuospatial deficit  Other lack of coordination    Problem List Patient Active Problem List   Diagnosis Date Noted  . Lacunar stroke of right subthalamic region ([facility]) 11/30/2017  . Occlusion and stenosis of left carotid artery 11/30/2017  . Hemiplegia and hemiparesis following cerebral infarction affecting left non-dominant side ([facility]) 11/20/2017  . Vertigo 11/20/2017  . Uncontrolled type 2 diabetes mellitus with neurologic complication, without long-term current use of insulin ([facility]) 10/17/2017  . History of hemorrhagic cerebrovascular accident (CVA) with residual deficit 09/30/2017  . OSA (obstructive sleep apnea) 09/30/2017  . Smoker 09/30/2017  . 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 Health care maintenance 06/20/2013  . Severe obesity (BMI 35.0-39.9) with comorbidity ([facility]) 12/15/2011  . Skin rash 07/07/2011  . HTN (hypertension)     Raeshawn Tafolla 12/28/2017, 10:47 AM  [facility] [address] [place], [place], [address] Phone: [phone]   Fax:  [phone]  Name: Thomas Howard MRN: [id number] Date of Birth: 04/12/1961

## 2017-12-28 NOTE — Therapy (Signed)
[facility] [address] [place], [place], [address] Phone: [phone]   Fax:  [phone]  Speech Language Pathology Treatment  Patient Details  Name: Thomas Howard MRN: [id number] Date of Birth: 26-Oct-1960 Referring Provider: Ria Bush, MD   Encounter Date: 12/28/2017  End of Session - 12/28/17 1155    Visit Number  10    Number of Visits  17    Date for SLP Re-Evaluation  12/31/17    SLP Start Time  0933    SLP Stop Time   6063    SLP Time Calculation (min)  42 min    Activity Tolerance  Patient tolerated treatment well       Past Medical History:  Diagnosis Date  . History of smoking   . HTN (hypertension)   . Hyperlipemia   . Obesity   . Wears partial dentures     Past Surgical History:  Procedure Laterality Date  . MOUTH SURGERY    . WISDOM TOOTH EXTRACTION      There were no vitals filed for this visit.  Subjective Assessment - 12/28/17 0941    Subjective  "She made me read all those statements."     Currently in Pain?  No/denies            ADULT SLP TREATMENT - 12/28/17 0941      General Information   Behavior/Cognition  Alert;Cooperative;Pleasant mood      Treatment Provided   Treatment provided  Cognitive-Linquistic      Cognitive-Linquistic Treatment   Treatment focused on  Dysarthria    Skilled Treatment  Pt reports more consistency with practicing overarticulation/improved speech volume - pt states he is reading out loud more than reading phrases and SLP told him that was ok as longas he was doing it daily. Pt worked further on eliminating dysarthria in sentence responses, with 80% success. In conversation of 3 minute lengths, pt req'd rare min A to generate more precise speech.      Assessment / Recommendations / Plan   Plan  Continue with current plan of care      Progression Toward Goals   Progression toward goals  Progressing toward goals       SLP Education  - 12/28/17 1155    Education Details  speech compensations    Person(s) Educated  Patient    Methods  Explanation;Demonstration    Comprehension  Verbalized understanding;Returned demonstration;Verbal cues required;Need further instruction       SLP Short Term Goals - 12/21/17 0936      SLP SHORT TERM GOAL #1   Title  pt will undergo cognitive linguistic testing in the first 3 sessions if necessary    Status  Achieved      SLP SHORT TERM GOAL #2   Title  pt will demo HEP for dysarthria (oral strength, and compensations) with occasional min A x4 sessions    Status  Partially Met      SLP SHORT TERM GOAL #3   Title  pt will use dysarthria compensations in 8 minutes simple conversation to allow for 100% overall intelligibility x2 sessions    Status  Partially Met      SLP SHORT TERM GOAL #4   Title  pt will use dysarthria compensations for 18/20 sentence responses x2 sessions    Status  Achieved      SLP SHORT TERM GOAL #5   Title  pt will use WNL loudness in 8 minutes simple conversation  x3 sessions    Status  Partially Met      SLP SHORT TERM GOAL #6   Title  Pt will alternate attention between 2 simple cognitive lingusitic tasks with 90% on each    Status  Not Met       SLP Long Term Goals - 12/28/17 1157      SLP LONG TERM GOAL #1   Title  pt will demo dysarthria HEP withrare min A x3 visits    Time  2    Period  Weeks    Status  On-going      SLP LONG TERM GOAL #2   Title  pt will demo 100% overall intelligibliity in 10 minutes simple to mod complex conversation with modified inedpendence x3 sessions    Time  2    Period  Weeks    Status  On-going      SLP LONG TERM GOAL #3   Title  pt will use average 70dB volume in 10 minutes simple to mod complex converastion x2 sessions    Time  2      SLP LONG TERM GOAL #4   Title  Pt will altenrate attention between 2 moderately complex cognitive linguistic tasks with occasional min A and 85% on each    Time  2     Status  On-going      SLP LONG TERM GOAL #5   Title  Pt will correct errors 3/5 on cognitive linguistic tasks with rare min A over 2 sessions    Time  2    Period  Weeks    Status  On-going       Plan - 12/28/17 1155    Clinical Impression Statement  Pt reports increasing the frequency of HEP throughout the weekend, though still suboptimal completion of scope/frequency. Pt began to carryover more precise speech in short conversational segements today. Continue skilled ST to maximize intellgilbity and cognition for indepedence and safety.     Speech Therapy Frequency  2x / week    Duration  --   8 weeks 17 sessions   Treatment/Interventions  Environmental controls;Cueing hierarchy;SLP instruction and feedback;Oral motor exercises;Compensatory strategies;Functional tasks;Cognitive reorganization;Multimodal communcation approach;Internal/external aids;Patient/family education    Potential to Achieve Goals  Good    Potential Considerations  Cooperation/participation level    Consulted and Agree with Plan of Care  Patient       Patient will benefit from skilled therapeutic intervention in order to improve the following deficits and impairments:   Dysarthria and anarthria  Cognitive communication deficit    Problem List Patient Active Problem List   Diagnosis Date Noted  . Lacunar stroke of right subthalamic region ([place]) 11/30/2017  . Occlusion and stenosis of left carotid artery 11/30/2017  . Hemiplegia and hemiparesis following cerebral infarction affecting left non-dominant side ([place]) 11/20/2017  . Vertigo 11/20/2017  . Uncontrolled type 2 diabetes mellitus with neurologic complication, without long-term current use of insulin ([place]) 10/17/2017  . History of hemorrhagic cerebrovascular accident (CVA) with residual deficit 09/30/2017  . OSA (obstructive sleep apnea) 09/30/2017  . Smoker 09/30/2017  . 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 Health care  maintenance 06/20/2013  . Severe obesity (BMI 35.0-39.9) with comorbidity ([place]) 12/15/2011  . Skin rash 07/07/2011  . HTN (hypertension)     Shelma Eiben ,MS, [place]  12/28/2017, 11:58 AM  [facility] [address] [place], [place], [address] Phone: [phone]   Fax:  [phone]   Name: Jack Bolio MRN: [id number] Date of Birth: Jul 04, 1960

## 2017-12-28 NOTE — Patient Instructions (Signed)
  Please complete the assigned speech therapy homework prior to your next session and return it to the speech therapist at your next visit.  

## 2017-12-29 NOTE — Therapy (Signed)
[facility] [address] [place], [place], [address] Phone: [phone]   Fax:  [phone]  Physical Therapy Treatment  Patient Details  Name: Thomas Howard MRN: [id number] Date of Birth: Sep 27, 1960 Referring Provider: Ria Bush, MD   Encounter Date: 12/28/2017  PT End of Session - 12/28/17 1104    Visit Number  14    Number of Visits  26    Date for PT Re-Evaluation  01/14/18    Authorization Type  [employer] 60 visit limit combined PT & OT, counts as 1 visit if seen on same day.  $20 co-pay    Authorization - Visit Number  20   counting PT, OT & speech, counts as 1 if seen on same day   Authorization - Number of Visits  60    PT Start Time  1102    PT Stop Time  1145    PT Time Calculation (min)  43 min    Equipment Utilized During Treatment  Gait belt    Activity Tolerance  Patient tolerated treatment well    Behavior During Therapy  WFL for tasks assessed/performed       Past Medical History:  Diagnosis Date  . History of smoking   . HTN (hypertension)   . Hyperlipemia   . Obesity   . Wears partial dentures     Past Surgical History:  Procedure Laterality Date  . MOUTH SURGERY    . WISDOM TOOTH EXTRACTION      Vitals:   12/28/17 1103 12/28/17 1119 12/28/17 1132 12/28/17 1144  BP: (!) 147/88 140/82 (!) 142/89 (!) 158/85  Pulse: 88 92 (!) 104 (!) 118    Subjective Assessment - 12/28/17 1519    Subjective  No new compaints. No falls or pain to report.     Pertinent History  HTN, + smoker,    Limitations  Lifting;Standing;Walking;House hold activities    Patient Stated Goals  To not be so fatigued with every day activities.     Currently in Pain?  No/denies           [facility] Adult PT Treatment/Exercise - 12/28/17 1118      Ambulation/Gait   Ambulation/Gait  Yes    Ambulation/Gait Assistance  5: Supervision;4: Min guard    Ambulation/Gait Assistance Details  fast pace gait with  converstation over varied outdoor surfaces in heat/humidity, including stepping over barriers x 4 with min guard to supervision. no balance issues noted. BP 142/89 afterwards, HR 104.    Ambulation Distance (Feet)  2500 Feet    Assistive device  None    Gait Pattern  Within Functional Limits    Ambulation Surface  Level;Unlevel;Indoor;Outdoor;Paved;Gravel;Grass;Other (comment)   stepping over barriers     Therapeutic Activites    Therapeutic Activities  Work Simulation    Work Simulation  pushing/pulling fully weighted weight cart along 20-30 foot pathway x 5 laps each way, then lifting 25# crate to carry for 70 feet, set down x 2 laps, then lifting 25# pound crate to carry 115 feet x 2 laps. BP 158/85, HR 118 afterwards.       Lumbar Exercises: Aerobic   Tread Mill  with 2 hand hold: fwd walking at 2.0 mph x 3 mintues, then side stepping at 0.6 mph x 2 minutes each way, then walking down hill at 1.5 mph x 3 minutes            PT Short Term Goals - 12/17/17 1020  PT SHORT TERM GOAL #1   Title  Patient verbalizes & demonstrates exercise program to safely perform at local fitness including if BP in safe range. (All STGs Target Date: 12/17/2017)    Baseline  has not returned to gym, but does HEP on regular basis    Time  4    Period  Weeks    Status  Partially Met      PT SHORT TERM GOAL #2   Title  Patient tolerates 30 minutes of moderate intensity activities with </= 2 seated rests for <3 minutes.     Baseline  Per pt report, he is unable to do so at this time    Time  4    Period  Weeks    Status  Not Met      PT SHORT TERM GOAL #3   Title  Patient lifts 35# box & carries 18' for 10 reps with cues on body mechanics only.     Baseline  met 12/17/17    Time  4    Period  Weeks    Status  Achieved      PT SHORT TERM GOAL #4   Title  Patient pushes/pulls weight cart 50' 20 reps with verbal cues on technique / body mechanics.     Baseline  met 12/17/17    Time  4    Period   Weeks    Status  Achieved        PT Long Term Goals - 12/07/17 1221      PT LONG TERM GOAL #1   Title  Patient demonstrates & verbalizes understanding of ongoing fitness plan /  HEP. (All updated LTGs Target  Date: 01/14/2018)    Time  8    Period  Weeks    Status  On-going    Target Date  01/14/18      PT LONG TERM GOAL #2   Title  Patient verbalizes safe BP for moderate to high intensity activites like fitness program or work.     Time  8    Period  Weeks    Status  On-going    Target Date  01/14/18      PT LONG TERM GOAL #3   Title  Patient tolerates 40 minutes of standing activities of moderate intensity without seated rests.    Time  8    Period  Weeks    Status  On-going    Target Date  01/14/18      PT LONG TERM GOAL #4   Title  Functional Gait Assessment >/= 28/30    Time  8    Period  Weeks    Status  On-going    Target Date  01/14/18      PT LONG TERM GOAL #5   Title  Patient lifts 100# crate floor to knee height surface 3 reps and lifts 55# / carries 7' 10 reps with proper body mechanics safely    Time  8    Period  Weeks    Status  On-going    Target Date  01/14/18      PT LONG TERM GOAL #6   Title  Patient pushes & pulls up to 125# average, pushes & pulls weighted cart 50' X 20 reps safely.     Time  8    Period  Weeks    Status  On-going    Target Date  01/14/18  Plan - 12/28/17 1105    Clinical Impression Statement  Today's skilled session continued to focus on work simulation/intensity training with rest breaks and vitals monitored throughout session. Vitals remained stable with pt tolerating increased speed/time on treadmill today. Pt is progressing toward goals and should benefit from continued PT to progress toward unmet goals.     Rehab Potential  Good    Clinical Impairments Affecting Rehab Potential  LLE weakness, high fall risk, significant other has noticed impaired processing verbal instructions    PT Frequency  2x / week     PT Duration  8 weeks    PT Treatment/Interventions  ADLs/Self Care Home Management;Canalith Repostioning;DME Instruction;Gait training;Stair training;Functional mobility training;Therapeutic activities;Therapeutic exercise;Balance training;Neuromuscular re-education;Patient/family education;Vestibular    PT Next Visit Plan  Check BP prior & during PT, lifting, carrying, pushing, pulling & gait /balance with increasing time tolerates moderate activities    Consulted and Agree with Plan of Care  Patient       Patient will benefit from skilled therapeutic intervention in order to improve the following deficits and impairments:  Abnormal gait, Decreased activity tolerance, Decreased balance, Decreased endurance, Decreased coordination, Decreased mobility, Decreased strength, Dizziness, Postural dysfunction  Visit Diagnosis: Muscle weakness (generalized)  Other abnormalities of gait and mobility  Unsteadiness on feet     Problem List Patient Active Problem List   Diagnosis Date Noted  . Lacunar stroke of right subthalamic region ([place]) 11/30/2017  . Occlusion and stenosis of left carotid artery 11/30/2017  . Hemiplegia and hemiparesis following cerebral infarction affecting left non-dominant side ([place]) 11/20/2017  . Vertigo 11/20/2017  . Uncontrolled type 2 diabetes mellitus with neurologic complication, without long-term current use of insulin ([place]) 10/17/2017  . History of hemorrhagic cerebrovascular accident (CVA) with residual deficit 09/30/2017  . OSA (obstructive sleep apnea) 09/30/2017  . Smoker 09/30/2017  . 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 Health care maintenance 06/20/2013  . Severe obesity (BMI 35.0-39.9) with comorbidity ([place]) 12/15/2011  . Skin rash 07/07/2011  . HTN (hypertension)     Willow Ora, PTA, [facility] [address] [place],  [address] [phone] 12/29/17, 3:20 PM   Name:  Thomas Howard MRN: [id number] Date of Birth: 1960/10/27

## 2017-12-31 ENCOUNTER — Encounter: Payer: Self-pay | Admitting: Physical Therapy

## 2017-12-31 ENCOUNTER — Ambulatory Visit: Payer: BLUE CROSS/BLUE SHIELD | Admitting: Occupational Therapy

## 2017-12-31 ENCOUNTER — Ambulatory Visit (INDEPENDENT_AMBULATORY_CARE_PROVIDER_SITE_OTHER): Payer: BLUE CROSS/BLUE SHIELD | Admitting: Neurology

## 2017-12-31 ENCOUNTER — Ambulatory Visit: Payer: BLUE CROSS/BLUE SHIELD | Admitting: Physical Therapy

## 2017-12-31 ENCOUNTER — Encounter: Payer: Self-pay | Admitting: Occupational Therapy

## 2017-12-31 ENCOUNTER — Ambulatory Visit: Payer: BLUE CROSS/BLUE SHIELD

## 2017-12-31 VITALS — BP 128/87 | HR 86

## 2017-12-31 VITALS — BP 147/89 | HR 112

## 2017-12-31 DIAGNOSIS — R2681 Unsteadiness on feet: Secondary | ICD-10-CM

## 2017-12-31 DIAGNOSIS — M6281 Muscle weakness (generalized): Secondary | ICD-10-CM

## 2017-12-31 DIAGNOSIS — R41841 Cognitive communication deficit: Secondary | ICD-10-CM | POA: Diagnosis not present

## 2017-12-31 DIAGNOSIS — I6522 Occlusion and stenosis of left carotid artery: Secondary | ICD-10-CM

## 2017-12-31 DIAGNOSIS — G4709 Other insomnia: Secondary | ICD-10-CM

## 2017-12-31 DIAGNOSIS — R2689 Other abnormalities of gait and mobility: Secondary | ICD-10-CM

## 2017-12-31 DIAGNOSIS — F515 Nightmare disorder: Secondary | ICD-10-CM

## 2017-12-31 DIAGNOSIS — I69315 Cognitive social or emotional deficit following cerebral infarction: Secondary | ICD-10-CM

## 2017-12-31 DIAGNOSIS — G4733 Obstructive sleep apnea (adult) (pediatric): Secondary | ICD-10-CM

## 2017-12-31 DIAGNOSIS — I6381 Other cerebral infarction due to occlusion or stenosis of small artery: Secondary | ICD-10-CM

## 2017-12-31 DIAGNOSIS — R293 Abnormal posture: Secondary | ICD-10-CM | POA: Diagnosis not present

## 2017-12-31 DIAGNOSIS — I10 Essential (primary) hypertension: Secondary | ICD-10-CM

## 2017-12-31 DIAGNOSIS — R278 Other lack of coordination: Secondary | ICD-10-CM | POA: Diagnosis not present

## 2017-12-31 DIAGNOSIS — R261 Paralytic gait: Secondary | ICD-10-CM | POA: Diagnosis not present

## 2017-12-31 DIAGNOSIS — R351 Nocturia: Secondary | ICD-10-CM

## 2017-12-31 DIAGNOSIS — R471 Dysarthria and anarthria: Secondary | ICD-10-CM

## 2017-12-31 DIAGNOSIS — R41842 Visuospatial deficit: Secondary | ICD-10-CM | POA: Diagnosis not present

## 2017-12-31 NOTE — Therapy (Signed)
[facility] [address] [place], [place], [address] Phone: [phone]   Fax:  [phone]  Occupational Therapy Treatment  Patient Details  Name: Thomas Howard MRN: [id number] Date of Birth: 07/19/1960 Referring Provider: Ria Bush, MD   Encounter Date: 12/31/2017  OT End of Session - 12/31/17 1237    Visit Number  14    Number of Visits  25    Date for OT Re-Evaluation  01/20/18   extended due to missed week   Authorization Type  [employer] - 60 visits combined for OT/PT, if occurs same day counts as one visit    Authorization - Visit Number  14    Authorization - Number of Visits  30    OT Start Time  1020    OT Stop Time  1100    OT Time Calculation (min)  40 min    Activity Tolerance  Patient tolerated treatment well    Behavior During Therapy  [facility] for tasks assessed/performed       Past Medical History:  Diagnosis Date  . History of smoking   . HTN (hypertension)   . Hyperlipemia   . Obesity   . Wears partial dentures     Past Surgical History:  Procedure Laterality Date  . MOUTH SURGERY    . WISDOM TOOTH EXTRACTION      Vitals:   12/31/17 1024  BP: 128/87  Pulse: 86    Subjective Assessment - 12/31/17 1239    Subjective   Denies pain    Pertinent History  hospitalized 09/22/17-09/25/17 at [facility] with acute nonhemorrhagic right basal ganglia lacunar infarct and HTN.    Patient Stated Goals  regain use of LUE    Currently in Pain?  No/denies          Treatment: Discussion with pt and girlfriend regarding pt progress and  importance of continued carryover at home with exercises and pt assisting with cooking with supervision. Pt/ girlfriend want  Pt has not been consistently exercising and he has not cooked with supervision at home yet. Pt. Was instructed to cook a meal with supervision this weekend. Strengthening exercises: biceps curls x 15 reps with 8 lbs weight, low range  shoulder flexion with 5 lbs weight and supine triceps extension with 4 lbs weight, 15 reps each min-mod v.c/ facilitation for proper positioning.                   OT Short Term Goals - 12/21/17 1732      OT SHORT TERM GOAL #1   Title  I with inital HEP. extended due 12/03/17, pt missed last week traveling    Status  Achieved      OT SHORT TERM GOAL #2   Title  Pt will verbalize understanding of compensatory strategies for visual perceptual  impairment    Status  Achieved      OT SHORT TERM GOAL #3   Title  Pt will perform all basic ADLS with modified indpendently with no more than 2 rest breaks    Status  Achieved      OT SHORT TERM GOAL #4   Title  Pt will perform tabletop scanning activities with 80% or better accuracy for improved safety during ADLs.    Status  Achieved      OT SHORT TERM GOAL #5   Title  Pt will perfrom basic environmental scanning with 80% or better accuracy in prep for driving.    Status  Achieved      OT SHORT TERM GOAL #6   Title  Pt will demonstrate ability to retrieve a lightweight object at 130* shoulder flexion without dropping with LUE     Status  Achieved      OT SHORT TERM GOAL #7   Title  further assess cognitve skills and set additional goal PRN    Status  Deferred   Pt is receiving ST       OT Long Term Goals - 12/21/17 1730      OT LONG TERM GOAL #1   Title  I with updated HEP. due 01/20/18 date extended due to missed week    Time  12    Period  Weeks    Status  On-going      OT LONG TERM GOAL #2   Title  Pt will demonstrate improved fine motor coordination  for ADLs as evidenced by decreasing 9 hole peg test score to 38 secs or less.    Baseline  RUE 26.94 sec, LUE 44.97 secs    Time  12    Period  Weeks    Status  Achieved   33.47     OT LONG TERM GOAL #3   Title  Pt will perform simple cooking task modified indpendently demonstrating good safety awareness.    Time  12    Period  Weeks    Status  On-going    Supervision demonstrating good safety awareness     OT LONG TERM GOAL #4   Title  Pt will perfrom tabletop and environmental scanning activities with 90% or better accurary in prep for ADLs and work activities.    Time  12    Period  Weeks    Status  On-going      OT LONG TERM GOAL #5   Title  Pt will demonstrate adequate LUE strength and control to retrieve a 5 lbs weight from mid level shelf x 5 reps without drops.    Time  12    Period  Weeks    Status  On-going            Plan - 12/31/17 1233    Clinical Impression Statement  Pt is progressing towards goals for overall strength and endurance. Therapist met with pt and significant other to discuss significane of caryover at home    Occupational Profile and client history currently impacting functional performance  Prior to CVA, pt was completely I, working full time and driving. Pt currently is unable to work, drive and he requires assist with ADLs/IADLs PMH: HTN, smoker    Rehab Potential  Good    OT Frequency  2x / week    OT Duration  8 weeks    OT Treatment/Interventions  Self-care/ADL training;Electrical Stimulation;Therapeutic exercise;Visual/perceptual remediation/compensation;Coping strategies training;Patient/family education;Splinting;Neuromuscular education;Paraffin;Moist Heat;Fluidtherapy;Energy conservation;Therapeutic activities;Cognitive remediation/compensation;Balance training;Passive range of motion;Manual Therapy;DME and/or AE instruction;Contrast Bath;Ultrasound;Cryotherapy    Plan  Pt will be out of town next week, plan to renew pt in early Sept for 4 more weeks    Consulted and Agree with Plan of Care  Patient    Family Member Consulted  April, girlfriend       Patient will benefit from skilled therapeutic intervention in order to improve the following deficits and impairments:     Visit Diagnosis: Muscle weakness (generalized)  Cognitive social or emotional deficit following cerebral  infarction    Problem List Patient Active Problem List   Diagnosis Date Noted  . Lacunar  stroke of right subthalamic region [facility]) 11/30/2017  . Occlusion and stenosis of left carotid artery 11/30/2017  . Hemiplegia and hemiparesis following cerebral infarction affecting left non-dominant side ([place]) 11/20/2017  . Vertigo 11/20/2017  . Uncontrolled type 2 diabetes mellitus with neurologic complication, without long-term current use of insulin ([place]) 10/17/2017  . History of hemorrhagic cerebrovascular accident (CVA) with residual deficit 09/30/2017  . OSA (obstructive sleep apnea) 09/30/2017  . Smoker 09/30/2017  . 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 Health care maintenance 06/20/2013  . Severe obesity (BMI 35.0-39.9) with comorbidity ([place]) 12/15/2011  . Skin rash 07/07/2011  . HTN (hypertension)     Keeva Reisen 12/31/2017, 12:39 PM  [facility] [address] [place], [place], [address] Phone: [phone]   Fax:  [phone]  Name: Michall Noffke MRN: [id number] Date of Birth: 01-Jan-1961

## 2017-12-31 NOTE — Therapy (Signed)
[facility] [address] [place], [place], [address] Phone: [phone]   Fax:  [phone]  Speech Language Pathology Treatment  Patient Details  Name: Thomas Howard MRN: [id number] Date of Birth: 02/18/1961 Referring Provider: Ria Bush, MD   Encounter Date: 12/31/2017  End of Session - 12/31/17 1418    Visit Number  11    Number of Visits  19    Date for SLP Re-Evaluation  02/11/18    SLP Start Time  0935    SLP Stop Time   1015    SLP Time Calculation (min)  40 min    Activity Tolerance  Patient tolerated treatment well       Past Medical History:  Diagnosis Date  . History of smoking   . HTN (hypertension)   . Hyperlipemia   . Obesity   . Wears partial dentures     Past Surgical History:  Procedure Laterality Date  . MOUTH SURGERY    . WISDOM TOOTH EXTRACTION      There were no vitals filed for this visit.         ADULT SLP TREATMENT - 12/31/17 1411      General Information   Behavior/Cognition  Alert;Cooperative;Pleasant mood      Treatment Provided   Treatment provided  Cognitive-Linquistic      Cognitive-Linquistic Treatment   Treatment focused on  Cognition    Skilled Treatment  Pt had difficulty with deductive reasoning puzzle - stated wife oculdn't copmlete either. SLP provided mod A with deduction puzzle #1. SLP worked with pt on deductive puzzle #2 and provided min-mod A - pt with decr'd alternating attention in filling in squares: would have two things to fill in and would iether fill in only one thing and req'd cues for hte next or would fill in second think in the wrong box without awareness.       Assessment / Recommendations / Plan   Plan  Continue with current plan of care      Progression Toward Goals   Progression toward goals  Progressing toward goals         SLP Short Term Goals - 12/21/17 0936      SLP SHORT TERM GOAL #1   Title  pt will undergo  cognitive linguistic testing in the first 3 sessions if necessary    Status  Achieved      SLP SHORT TERM GOAL #2   Title  pt will demo HEP for dysarthria (oral strength, and compensations) with occasional min A x4 sessions    Status  Partially Met      SLP SHORT TERM GOAL #3   Title  pt will use dysarthria compensations in 8 minutes simple conversation to allow for 100% overall intelligibility x2 sessions    Status  Partially Met      SLP SHORT TERM GOAL #4   Title  pt will use dysarthria compensations for 18/20 sentence responses x2 sessions    Status  Achieved      SLP SHORT TERM GOAL #5   Title  pt will use WNL loudness in 8 minutes simple conversation x3 sessions    Status  Partially Met      SLP SHORT TERM GOAL #6   Title  Pt will alternate attention between 2 simple cognitive lingusitic tasks with 90% on each    Status  Not Met       SLP Long Term Goals - 12/31/17 1421  SLP LONG TERM GOAL #1   Title  pt will demo dysarthria HEP withrare min A x3 visits    Time  4   all pertinent LTGs renewed, week of 01-04-28   Period  Weeks    Status  Not Met   and ongoing     SLP LONG TERM GOAL #2   Title  pt will demo 100% overall intelligibliity in 10 minutes simple to mod complex conversation with modified inedpendence x3 sessions    Time  4    Period  Weeks    Status  Partially Met   and ongoing     SLP LONG TERM GOAL #3   Title  pt will use average 70dB volume in 10 minutes simple to mod complex converastion x2 sessions    Status  Deferred   pt volume is baseline     SLP LONG TERM GOAL #4   Title  Pt will altenrate attention between 2 moderately complex cognitive linguistic tasks with occasional min A and 85% on each    Time  4    Status  Not Met   and ongoing     SLP LONG TERM GOAL #5   Title  Pt will correct errors 3/5 on cognitive linguistic tasks with rare min A over 2 sessions    Time  4    Period  Weeks    Status  Not Met   and ongoing      Plan -  12/31/17 1419    Clinical Impression Statement  Pt reports increasing the frequency of HEP for dysarthria, and his speech is slowlyimproving. He still demo's cognitive communcation deficits in the areas of attention and awareness, at minimum. Continue skilled ST to maximize intellgilbity and cognition for indepedence and safety.     Speech Therapy Frequency  2x / week    Duration  4 weeks   or 8 more sessions (to session 19)   Treatment/Interventions  Environmental controls;Cueing hierarchy;SLP instruction and feedback;Oral motor exercises;Compensatory strategies;Functional tasks;Cognitive reorganization;Multimodal communcation approach;Internal/external aids;Patient/family education    Potential to Achieve Goals  Good    Potential Considerations  Cooperation/participation level    Consulted and Agree with Plan of Care  Patient       Patient will benefit from skilled therapeutic intervention in order to improve the following deficits and impairments:   Cognitive communication deficit  Dysarthria and anarthria   Problem List Patient Active Problem List   Diagnosis Date Noted  . Lacunar stroke of right subthalamic region ([facility]) 11/30/2017  . Occlusion and stenosis of left carotid artery 11/30/2017  . Hemiplegia and hemiparesis following cerebral infarction affecting left non-dominant side ([place]) 11/20/2017  . Vertigo 11/20/2017  . Uncontrolled type 2 diabetes mellitus with neurologic complication, without long-term current use of insulin ([place]) 10/17/2017  . History of hemorrhagic cerebrovascular accident (CVA) with residual deficit 09/30/2017  . OSA (obstructive sleep apnea) 09/30/2017  . Smoker 09/30/2017  . 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 Health care maintenance 06/20/2013  . Severe obesity (BMI 35.0-39.9) with comorbidity ([place]) 12/15/2011  . Skin rash 07/07/2011  . HTN (hypertension)     [place] ,MS, [place]  12/31/2017, 2:24 PM  [facility] [address] [place], [place], [address] Phone: [phone]   Fax:  [phone]   Name: Thomas Howard MRN: [id number] Date of Birth: 08/21/1960

## 2017-12-31 NOTE — Patient Instructions (Signed)
  Please complete the assigned speech therapy homework prior to your next session and return it to the speech therapist at your next visit.  

## 2017-12-31 NOTE — Patient Instructions (Signed)
WALKING  Walking is a great form of exercise to increase your strength, endurance and overall fitness.  A walking program can help you start slowly and gradually build endurance as you go.  Everyone's ability is different, so each person's starting point will be different.  You do not have to follow them exactly.  The are just samples. You should simply find out what's right for you and stick to that program.   In the beginning, you'll start off walking 2-3 times a day for short distances.  As you get stronger, you'll be walking further at just 1-2 times per day.  A. You Can Walk For A Certain Length Of Time Each Day    Walk 15 minutes 1 times per day.  Increase 2-3 minutes every 5 days (3 times per day).  Work up to 25-30 minutes (1-2 times per day).      Please only do the exercises that your therapist has initialed and dated

## 2018-01-02 NOTE — Therapy (Signed)
[facility] [address] [place], [place], [address] Phone: [phone]   Fax:  [phone]  Physical Therapy Treatment  Patient Details  Name: Thomas Howard MRN: [id number] Date of Birth: Apr 29, 1961 Referring Provider: Ria Bush, MD   Encounter Date: 12/31/2017     12/31/17 1105  PT Visits / Re-Eval  Visit Number 15  Number of Visits 26  Date for PT Re-Evaluation 01/14/18  Authorization  Authorization Type [employer] 60 visit limit combined PT & OT, counts as 1 visit if seen on same day.  $20 co-pay  Authorization - Visit Number 21 (counting PT, OT & speech, counts as 1 if seen on same day)  Authorization - Number of Visits 60  PT Time Calculation  PT Start Time 1103  PT Stop Time 1145  PT Time Calculation (min) 42 min  PT - End of Session  Equipment Utilized During Treatment Gait belt  Activity Tolerance Patient tolerated treatment well  Behavior During Therapy WFL for tasks assessed/performed    Past Medical History:  Diagnosis Date  . History of smoking   . HTN (hypertension)   . Hyperlipemia   . Obesity   . Wears partial dentures     Past Surgical History:  Procedure Laterality Date  . MOUTH SURGERY    . WISDOM TOOTH EXTRACTION      Vitals:   12/31/17 1105 12/31/17 1125 12/31/17 1143  BP: (!) 143/85 125/88 (!) 147/89  Pulse:  (!) 102 (!) 112     12/31/17 1104  Symptoms/Limitations  Subjective No new compaints. No falls or pain to report.   Patient is accompained by: Family member (April (GF) in gym with him today)  Pertinent History HTN, + smoker,  Limitations Lifting;Standing;Walking;House hold activities  Patient Stated Goals To not be so fatigued with every day activities.   Pain Assessment  Currently in Pain? No/denies      12/31/17 1106  Ambulation/Gait  Ambulation/Gait Yes  Ambulation/Gait Assistance 5: Supervision  Ambulation/Gait Assistance Details PTA setting and  maintaining moderately fast pace for increased distance with goal to establish walking program for home. BP 125/88 afterwards. HR 102.  minor veering noted at times with gait. Pt also engaged in converstation for entire time, 12-13 minutes duration.                   Ambulation Distance (Feet) 3000 Feet  Assistive device None  Gait Pattern WFL  Ambulation Surface Level;Unlevel;Indoor;Outdoor;Paved  Therapeutic Activites   Therapeutic Activities Work Actor following the legpress: lifting 35# crate, carring for 70 feet and setting it down. performed this for 4 laps.  cues for breathing and body mechanics needed. BP afterwards   Exercises  Exercises Other Exercises  Other Exercises  Legpress bil LE's 100# for 15 reps with 5 sec holds each, single leg:  65# with right leg for 15 reps with 5 sec holds, then with left leg only.      12/31/17 1145  PT Education  Education Details issued formal walking program as girlfriend reported he has not been walking at home. Pt stated he wasn't sure to do this or not.   Person(s) Educated Patient;Other (comment)  Methods Explanation;Demonstration;Verbal cues;Handout  Comprehension Verbalized understanding;Returned demonstration;Need further instruction       PT Short Term Goals - 12/17/17 1020      PT SHORT TERM GOAL #1   Title  Patient verbalizes & demonstrates exercise program to safely perform at local fitness including  if BP in safe range. (All STGs Target Date: 12/17/2017)    Baseline  has not returned to gym, but does HEP on regular basis    Time  4    Period  Weeks    Status  Partially Met      PT SHORT TERM GOAL #2   Title  Patient tolerates 30 minutes of moderate intensity activities with </= 2 seated rests for <3 minutes.     Baseline  Per pt report, he is unable to do so at this time    Time  4    Period  Weeks    Status  Not Met      PT SHORT TERM GOAL #3   Title  Patient lifts 35# box & carries 50' for 10 reps  with cues on body mechanics only.     Baseline  met 12/17/17    Time  4    Period  Weeks    Status  Achieved      PT SHORT TERM GOAL #4   Title  Patient pushes/pulls weight cart 50' 20 reps with verbal cues on technique / body mechanics.     Baseline  met 12/17/17    Time  4    Period  Weeks    Status  Achieved        PT Long Term Goals - 12/07/17 1221      PT LONG TERM GOAL #1   Title  Patient demonstrates & verbalizes understanding of ongoing fitness plan /  HEP. (All updated LTGs Target  Date: 01/14/2018)    Time  8    Period  Weeks    Status  On-going    Target Date  01/14/18      PT LONG TERM GOAL #2   Title  Patient verbalizes safe BP for moderate to high intensity activites like fitness program or work.     Time  8    Period  Weeks    Status  On-going    Target Date  01/14/18      PT LONG TERM GOAL #3   Title  Patient tolerates 40 minutes of standing activities of moderate intensity without seated rests.    Time  8    Period  Weeks    Status  On-going    Target Date  01/14/18      PT LONG TERM GOAL #4   Title  Functional Gait Assessment >/= 28/30    Time  8    Period  Weeks    Status  On-going    Target Date  01/14/18      PT LONG TERM GOAL #5   Title  Patient lifts 100# crate floor to knee height surface 3 reps and lifts 55# / carries 50' 10 reps with proper body mechanics safely    Time  8    Period  Weeks    Status  On-going    Target Date  01/14/18      PT LONG TERM GOAL #6   Title  Patient pushes & pulls up to 125# average, pushes & pulls weighted cart 50' X 20 reps safely.     Time  8    Period  Weeks    Status  On-going    Target Date  01/14/18         12/31/17 1105  Plan  Clinical Impression Statement Today's skilled session issued a formal walking program for HEP. Remainder of session continued to focus   on work condioning/strengthening without any issues other than fatigue. VSS with activity today. Pt is progressing toward goals and should  benefit from continued PT to progress toward unmet goals.   Pt will benefit from skilled therapeutic intervention in order to improve on the following deficits Abnormal gait;Decreased activity tolerance;Decreased balance;Decreased endurance;Decreased coordination;Decreased mobility;Decreased strength;Dizziness;Postural dysfunction  Rehab Potential Good  Clinical Impairments Affecting Rehab Potential LLE weakness, high fall risk, significant other has noticed impaired processing verbal instructions  PT Frequency 2x / week  PT Duration 8 weeks  PT Treatment/Interventions ADLs/Self Care Home Management;Canalith Repostioning;DME Instruction;Gait training;Stair training;Functional mobility training;Therapeutic activities;Therapeutic exercise;Balance training;Neuromuscular re-education;Patient/family education;Vestibular  PT Next Visit Plan Check BP prior & during PT, lifting, carrying, pushing, pulling & gait /balance with increasing time tolerates moderate activities  Consulted and Agree with Plan of Care Patient          Patient will benefit from skilled therapeutic intervention in order to improve the following deficits and impairments:  Abnormal gait, Decreased activity tolerance, Decreased balance, Decreased endurance, Decreased coordination, Decreased mobility, Decreased strength, Dizziness, Postural dysfunction  Visit Diagnosis: Muscle weakness (generalized)  Other abnormalities of gait and mobility  Unsteadiness on feet     Problem List Patient Active Problem List   Diagnosis Date Noted  . Lacunar stroke of right subthalamic region ([place]) 11/30/2017  . Occlusion and stenosis of left carotid artery 11/30/2017  . Hemiplegia and hemiparesis following cerebral infarction affecting left non-dominant side ([place]) 11/20/2017  . Vertigo 11/20/2017  . Uncontrolled type 2 diabetes mellitus with neurologic complication, without long-term current use of insulin ([facility]) 10/17/2017  . History of  hemorrhagic cerebrovascular accident (CVA) with residual deficit 09/30/2017  . OSA (obstructive sleep apnea) 09/30/2017  . Smoker 09/30/2017  . 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 Health care maintenance 06/20/2013  . Severe obesity (BMI 35.0-39.9) with comorbidity ([facility]) 12/15/2011  . Skin rash 07/07/2011  . HTN (hypertension)     Willow Ora, PTA, [facility] [address] [place], [place] [address] [phone] 01/02/18, 1:06 PM   Name: Thomas Howard MRN: [id number] Date of Birth: [birth year]

## 2018-01-04 ENCOUNTER — Telehealth: Payer: Self-pay | Admitting: Neurology

## 2018-01-04 ENCOUNTER — Ambulatory Visit: Payer: BLUE CROSS/BLUE SHIELD | Admitting: Speech Pathology

## 2018-01-04 ENCOUNTER — Encounter: Payer: BLUE CROSS/BLUE SHIELD | Admitting: Occupational Therapy

## 2018-01-04 ENCOUNTER — Ambulatory Visit: Payer: BLUE CROSS/BLUE SHIELD | Admitting: Physical Therapy

## 2018-01-04 NOTE — Procedures (Signed)
PATIENT'S NAME:  Thomas Howard, Thomas Howard DOB:      12-30-1960      MR#:    [id number]     DATE OF RECORDING: 12/31/2017 REFERRING M.D.:  Eustaquio Boyden, M.D. Study Performed:  Split-Night Titration Study HISTORY: Thomas Howard is a 57 y.o. male patient of Dr. Sharen Hones, seen for a sleep apnea evaluation. The patient has a history of DM, HTN and developed acute onset headaches on 09-22-2017, with facial droop and dysarthria- was evaluated in [place] and diagnosed with a lacunar basal ganglia non - hemorrhagic stroke. Since lacunar infarcts are small and mid size vessel occlusions with the main risk factor being hypertension and secondary risk factors being diabetes and hyperlipidemia, migraine also -it is important to eliminate sleep apnea. The patient had undergone a sleep study several years ago in [place] at the [facility], must have been diagnosed with obstructive sleep apnea at the time as he was issued a CPAP with a full facemask which she could not tolerate, he stated that the air was too dry irritated his airway, he often inadvertently and unintentionally removed the mask at night during his sleep.   He reports snoring, nocturia 2-3 times, dreaming often and vividly.  He has been acting out some of his more lucid dreams, kicking or yelling or thrashing about.  He has not been a sleep walker.  These spells occur while he is in bed and usually time-limited to 2 or 3 minutes.  He does not remember most of these. The patient endorsed the Epworth Sleepiness Scale at 23/24 points. The patient's weight 237 pounds with a height of 68 (inches), resulting in a BMI of 36.1 kg/m2.The patient's neck circumference measured 17.5 inches.  CURRENT MEDICATIONS: Norvasc, Aspirin, Lipitor, Plavix, Hydrodiuril, Prinivil, Nicoderm.  PROCEDURE:  This is a multichannel digital polysomnogram utilizing the Somnostar 11.2 system.  Electrodes and sensors were applied and monitored per AASM Specifications.   EEG, EOG, Chin and Limb EMG, were  sampled at 200 Hz.  ECG, Snore and Nasal Pressure, Thermal Airflow, Respiratory Effort, CPAP Flow and Pressure, Oximetry was sampled at 50 Hz. Digital video and audio were recorded.      BASELINE STUDY WITHOUT CPAP RESULTS: Lights Out was at 21:24 and Lights On at 04:36.  Total recording time (TRT) was 259.5, with a total sleep time (TST) of 130 minutes.   The patient's sleep latency was 82.5 minutes.  REM latency was 154 minutes.  The sleep efficiency was poor at only 50.1 %.  SLEEP ARCHITECTURE: WASO (Wake after sleep onset) was 52.5 minutes, Stage N1 was 6 minutes, Stage N2 was 82 minutes, Stage N3 was 13.5 minutes and Stage R (REM sleep) was 28.5 minutes.  The percentages were Stage N1 4.6%, Stage N2 63.1%, Stage N3 10.4% and Stage R (REM sleep) 21.9%.   RESPIRATORY ANALYSIS:  There were a total of 68 respiratory events:  19 obstructive apneas, and 19 apneas and an apnea index (AI) of 8.8. There were 49 hypopneas with a hypopnea index of 22.6. The patient also had 0 respiratory event related arousals (RERAs). Snoring was noted.     The total APNEA/HYPOPNEA INDEX (AHI) was 31.4 /hour and the total RESPIRATORY DISTURBANCE INDEX was 31.4 /hour.  38 events occurred in REM sleep and 54 events in NREM. The REM AHI was 80, /hour versus a non-REM AHI of 17.7 /hour. The patient spent 106.5 minutes sleep time in the supine position 130 minutes in non-supine. The supine AHI was 0.0 /hour versus a non-supine  AHI of 31.4 /hour.  OXYGEN SATURATION & C02:  The wake baseline 02 saturation was 92%, with the lowest being 77%. Time spent below 89% saturation equaled 33 minutes. Overall, the peak End [place] CO2 during sleep was 51.8 torr in NREM for 1.6 minutes above 50 torr.  PERIODIC LIMB MOVEMENTS/ AROUSALS: The patient had a total of 5 Periodic Limb Movements.  The Periodic Limb Movement (PLM) index was 2.3 /hour and the PLM Arousal index was 0.0/hour. The arousals were noted as: 11 were spontaneous, 0 were associated  with PLMs, and 4 were associated with respiratory events. Audio and video analysis did not show any abnormal or unusual movements, behaviors, phonations or vocalizations- NO evidence of REM behavior during this study.  The patient took 2 bathroom breaks Snoring was noted, louder in supine and in REM.  EKG was in keeping with normal sinus rhythm (NSR) and isolated PVCs.   TITRATION STUDY WITH CPAP RESULTS: CPAP was initiated at 5 cmH20 with heated humidity per AASM split night standards and pressure was advanced to 11 cmH20 because of hypopneas, apneas and desaturations.  At a PAP pressure of 11 cmH20, there was a reduction of the AHI to 0.0 /hour. REM sleep in supine was not reached under CPAP > a FFM in small size by [employer]- the " DreamWear FFM " was used. The patient indicated how very happy he was with this fitting and slept better than usual.   Total recording time (TRT) was 173 minutes, with a total sleep time (TST) of 106.5 minutes. The patient's sleep latency was 67.5 minutes. REM latency was 0 minutes.  The sleep efficiency was now 61.6 %.   SLEEP ARCHITECTURE: Wake after sleep was 9 minutes, Stage N1 10 minutes, Stage N2 64 minutes, Stage N3 32.5 minutes and Stage R (REM sleep) 0 minutes. The percentages were: Stage N1 9.4%, Stage N2 60.1%, Stage N3 30.5% and Stage R (REM sleep) 0%.   RESPIRATORY ANALYSIS:  There were a total of 47 respiratory events: 22 obstructive apneas, 4 central apneas and 21 hypopneas with 0 respiratory event related arousals (RERAs).    The total APNEA/HYPOPNEA INDEX (AHI) was 26.5 /hour and the total RESPIRATORY DISTURBANCE INDEX was 26.5 /hour.  0 events occurred in REM sleep and 47 events in NREM. The REM AHI was 0 /hour versus a non-REM AHI of 26.5 /hour. The patient spent 100% of total sleep time in the supine position. The supine AHI was 26.4 /hour, versus a non-supine AHI of 0.0/hour.  OXYGEN SATURATION & C02:  The wake baseline 02 saturation was 94%, with  the lowest being 87%. Time spent below 89% saturation equaled 1 minute.  PERIODIC LIMB MOVEMENTS/ AROUSALS:   The patient had a total of 0 Periodic Limb Movements. The arousals were noted as: 9 were spontaneous, 0 were associated with PLMs, and 16 were associated with respiratory events.  POLYSOMNOGRAPHY IMPRESSION : Difficulties to initiate and maintain sleep.   1. Obstructive Sleep Apnea (OSA) of Moderate Severity and associated with Hypoxemia for 33 minutes.   2. Primary Snoring     RECOMMENDATIONS: A CPAP pressure of 11 cmH20 reduced the AHI to 0.0 /hour.  REM sleep was not reached under CPAP. A FFM in small size by [employer]- the " DreamWear FFM " was used. The patient indicated how very happy he was with this fitting and slept better than usual.  I will order an auto-titration capable CPAP device with a pressure window from 7 through 13 cm water  CPAP, with a 2 cm EPR, heated humidity and the above named interface.   A follow up appointment will be scheduled in the Sleep Clinic at [facility].      I certify that I have reviewed the entire raw data recording prior to the issuance of this report in accordance with the Standards of Accreditation of the American Academy of Sleep Medicine (AASM)      Melvyn Novas, M.D.    01-04-2018  Diplomat, American Board of Psychiatry and Neurology  Diplomat, American Board of Sleep Medicine Medical Director, [place] [facility]

## 2018-01-04 NOTE — Telephone Encounter (Signed)
-----   Message from Melvyn Novasarmen Dohmeier, MD sent at 01/04/2018  8:50 AM EDT ----- POLYSOMNOGRAPHY IMPRESSION : Difficulties to initiate and  maintain sleep.   1. Obstructive Sleep Apnea (OSA) of Moderate Severity and  associated with Hypoxemia for 33 minutes.  2. Primary Snoring     RECOMMENDATIONS: A CPAP pressure of 11 cmH20 reduced the AHI to  0.0 /hour.  REM sleep was not reached under CPAP. A FFM in small size by  [employer]- the " DreamWear FFM " was used. The patient  indicated how very happy he was with this fitting and slept  better than usual.  I will order an auto-titration capable CPAP device with a  pressure window from 7 through 13 cm water CPAP, with a 2 cm EPR,  heated humidity and the above named interface.  COMPLIANT CPAP USE is defined as 4 hours or more of nightly use- if you cannot tolerate or use your machine inform the DME or us immediately to help you. DO NOT WAIT until revisit appointment !

## 2018-01-04 NOTE — Addendum Note (Signed)
Addended by: Melvyn NovasHMEIER, Varina Hulon on: 01/04/2018 08:50 AM   Modules accepted: Orders

## 2018-01-04 NOTE — Telephone Encounter (Signed)
Called patient to discuss sleep study results. No answer at this time. LVM for the patient to call back.   

## 2018-01-05 NOTE — Telephone Encounter (Signed)
I called pt and spoke with his partner. I advised pt that Dr. Vickey Hugerohmeier reviewed their sleep study results and found that pt had moderate sleep apnea. Dr. Vickey Hugerohmeier recommends that pt starts a auto CPAP. I reviewed PAP compliance expectations with the pt. Pt is agreeable to starting a CPAP. I advised pt that an order will be sent to a DME, [facility], and [facility] will call the pt within about one week after they file with the pt's insurance. [facility] will show the pt how to use the machine, fit for masks, and troubleshoot the CPAP if needed. A follow up appt was made for insurance purposes with Butch PennyMegan Millikan, NP on Nov 1,2019 at 8:30 am. Pt's partner verbalized understanding to arrive 15 minutes early and bring their CPAP. A letter with all of this information in it will be mailed to the pt as a reminder. I verified with the pt that the address we have on file is correct. Pt's partner verbalized understanding of results. Pt's partner had no questions at this time but was encouraged to call back if questions arise.

## 2018-01-07 ENCOUNTER — Ambulatory Visit: Payer: BLUE CROSS/BLUE SHIELD | Admitting: Physical Therapy

## 2018-01-11 ENCOUNTER — Ambulatory Visit: Payer: BLUE CROSS/BLUE SHIELD | Admitting: Occupational Therapy

## 2018-01-11 ENCOUNTER — Ambulatory Visit: Payer: BLUE CROSS/BLUE SHIELD

## 2018-01-11 ENCOUNTER — Ambulatory Visit: Payer: BLUE CROSS/BLUE SHIELD | Admitting: Physical Therapy

## 2018-01-14 ENCOUNTER — Encounter: Payer: Self-pay | Admitting: Family Medicine

## 2018-01-14 ENCOUNTER — Ambulatory Visit: Payer: BLUE CROSS/BLUE SHIELD | Admitting: Family Medicine

## 2018-01-14 ENCOUNTER — Encounter: Payer: BLUE CROSS/BLUE SHIELD | Admitting: Speech Pathology

## 2018-01-14 VITALS — BP 132/76 | HR 91 | Temp 98.5°F | Ht 68.0 in | Wt 237.2 lb

## 2018-01-14 DIAGNOSIS — G4733 Obstructive sleep apnea (adult) (pediatric): Secondary | ICD-10-CM

## 2018-01-14 DIAGNOSIS — I10 Essential (primary) hypertension: Secondary | ICD-10-CM

## 2018-01-14 DIAGNOSIS — E1149 Type 2 diabetes mellitus with other diabetic neurological complication: Secondary | ICD-10-CM

## 2018-01-14 DIAGNOSIS — IMO0002 Reserved for concepts with insufficient information to code with codable children: Secondary | ICD-10-CM

## 2018-01-14 DIAGNOSIS — I6930 Unspecified sequelae of cerebral infarction: Secondary | ICD-10-CM

## 2018-01-14 DIAGNOSIS — Z23 Encounter for immunization: Secondary | ICD-10-CM

## 2018-01-14 DIAGNOSIS — E1165 Type 2 diabetes mellitus with hyperglycemia: Secondary | ICD-10-CM | POA: Diagnosis not present

## 2018-01-14 DIAGNOSIS — I69354 Hemiplegia and hemiparesis following cerebral infarction affecting left non-dominant side: Secondary | ICD-10-CM

## 2018-01-14 LAB — POCT GLYCOSYLATED HEMOGLOBIN (HGB A1C): Hemoglobin A1C: 9.3 % — AB (ref 4.0–5.6)

## 2018-01-14 MED ORDER — METFORMIN HCL 1000 MG PO TABS: 1000.0000 mg | ORAL_TABLET | Freq: Two times a day (BID) | ORAL | 11 refills | Status: DC

## 2018-01-14 NOTE — Progress Notes (Signed)
BP 132/76 (BP Location: Left Arm, Patient Position: Sitting, Cuff Size: Large)   Pulse 91   Temp 98.5 F (36.9 C) (Oral)   Ht 5' 8"  (1.727 m)   Wt 237 lb 4 oz (107.6 kg)   SpO2 94%   BMI 36.07 kg/m    CC: 1 mo f/u visit Subjective:    Patient ID: Thomas Howard, male    DOB: 02-11-1961, 57 y.o.   MRN: [id number]  HPI: Thomas Howard is a 57 y.o. male presenting on 01/14/2018 for 1 mo follow up (Pt accompanied by his wife.)   Recent dx OSA ([place]) - planned starting CPAP with [employer] [facility] through [employer].   Lacunar CVA with residual neurological deficits continues seeing PT/OT and ST. Dr Leta Baptist recommended DAPT x 3 months post stroke then decrease to aspirin 23m daily. Stroke 09/22/2017. He is not ye tready to return to work. He is actually considering retiring.   DM - does not regularly check sugars. Compliant with antihyperglycemic regimen which includes: metformin 5050mbid, recently started. Some diarrhea with this. Denies low sugars or hypoglycemic symptoms. Denies paresthesias. Last diabetic eye exam due. Pneumovax: DUE. Prevnar: declines. Glucometer brand: side kick. DSME: pending. Lab Results  Component Value Date   HGBA1C 9.3 (A) 01/14/2018   Diabetic Foot Exam - Simple   No data filed     No results found for: MIDerl [place] Relevant past medical, surgical, family and social history reviewed and updated as indicated. Interim medical history since our last visit reviewed. Allergies and medications reviewed and updated. Outpatient Medications Prior to Visit  Medication Sig Dispense Refill  . amLODipine (NORVASC) 10 MG tablet Take 1 tablet (10 mg total) by mouth daily. 90 tablet 0  . aspirin 81 MG EC tablet Take 81 mg by mouth daily.  0  . atorvastatin (LIPITOR) 80 MG tablet Take 1 tablet (80 mg total) by mouth at bedtime. 90 tablet 3  . Blood Glucose Monitoring Suppl (ONE TOUCH ULTRA 2) w/Device KIT Use as directed E11.49 1 each 0  . glucose blood (ONE  TOUCH ULTRA TEST) test strip Use as instructed to check sugars BID E11.49 100 each 3  . hydrochlorothiazide (HYDRODIURIL) 50 MG tablet Take 0.5 tablets (25 mg total) by mouth daily.    . Lancets (ONETOUCH ULTRASOFT) lancets Use as instructed to check sugars BID E11.49 100 each 3  . lisinopril (PRINIVIL,ZESTRIL) 40 MG tablet Take 1 tablet (40 mg total) by mouth daily. 90 tablet 3  . clopidogrel (PLAVIX) 75 MG tablet Take 1 tablet (75 mg total) by mouth daily. 90 tablet 3  . metFORMIN (GLUCOPHAGE) 500 MG tablet Take 1 tablet (500 mg total) by mouth 2 (two) times daily with a meal. First week take one tablet daily 60 tablet 11  . nicotine (NICODERM CQ - DOSED IN MG/24 HOURS) 21 mg/24hr patch Place 21 mg onto the skin daily.     No facility-administered medications prior to visit.      Per HPI unless specifically indicated in ROS section below Review of Systems     Objective:    BP 132/76 (BP Location: Left Arm, Patient Position: Sitting, Cuff Size: Large)   Pulse 91   Temp 98.5 F (36.9 C) (Oral)   Ht 5' 8"  (1.727 m)   Wt 237 lb 4 oz (107.6 kg)   SpO2 94%   BMI 36.07 kg/m   Wt Readings from Last 3 Encounters:  01/14/18 237 lb 4 oz (107.6 kg)  12/14/17  234 lb 8 oz (106.4 kg)  12/01/17 237 lb 3.2 oz (107.6 kg)    Physical Exam  Constitutional: He appears well-developed and well-nourished. No distress.  HENT:  Mouth/Throat: Oropharynx is clear and moist. No oropharyngeal exudate.  Cardiovascular: Normal rate, regular rhythm and normal heart sounds.  No murmur heard. Pulmonary/Chest: Effort normal and breath sounds normal. No respiratory distress. He has no wheezes. He has no rales.  Musculoskeletal: He exhibits no edema.  Psychiatric: He has a normal mood and affect.  Nursing note and vitals reviewed.  Results for orders placed or performed in visit on 01/14/18  POCT glycosylated hemoglobin (Hb A1C)  Result Value Ref Range   Hemoglobin A1C 9.3 (A) 4.0 - 5.6 %   HbA1c POC (<>  result, manual entry)     HbA1c, POC (prediabetic range)     HbA1c, POC (controlled diabetic range)     Lab Results  Component Value Date   CREATININE 1.04 10/14/2017   BUN 23 10/14/2017   NA 134 (L) 10/14/2017   K 3.3 (L) 10/14/2017   CL 98 10/14/2017   CO2 28 10/14/2017       Assessment & Plan:   Problem List Items Addressed This Visit    Uncontrolled type 2 diabetes mellitus with neurologic complication, without long-term current use of insulin ([place]) - Primary    Has not started checking sugars yet - but hey have glucometer to start. Discussed goal ranges either fasting or post prandial. A1c remains elevated - will increase metformin to 1044m bid max dose. RTC 6 wks f/u visit.       Relevant Medications   metFORMIN (GLUCOPHAGE) 1000 MG tablet   Other Relevant Orders   POCT glycosylated hemoglobin (Hb A1C) (Completed)   OSA (obstructive sleep apnea)    Appreciate neuro care - planning to start CPAP.       HTN (hypertension)    Chronic, stable. Continue current regimen.       History of hemorrhagic cerebrovascular accident (CVA) with residual deficit    Will remain out of work through next evaluation mid October. Continues PT/OT/ST.       Hemiplegia and hemiparesis following cerebral infarction affecting left non-dominant side (HCC)    Other Visit Diagnoses    Need for 23-polyvalent pneumococcal polysaccharide vaccine       Relevant Orders   Pneumococcal polysaccharide vaccine 23-valent greater than or equal to 2yo subcutaneous/IM   Need for influenza vaccination       Relevant Orders   Flu Vaccine QUAD 36+ mos IM (Completed)       Meds ordered this encounter  Medications  . metFORMIN (GLUCOPHAGE) 1000 MG tablet    Sig: Take 1 tablet (1,000 mg total) by mouth 2 (two) times daily with a meal.    Dispense:  60 tablet    Refill:  11    Note new sig   Orders Placed This Encounter  Procedures  . Flu Vaccine QUAD 36+ mos IM  . Pneumococcal polysaccharide  vaccine 23-valent greater than or equal to 2yo subcutaneous/IM  . POCT glycosylated hemoglobin (Hb A1C)    Follow up plan: Return in about 6 weeks (around 02/25/2018).  JRia Bush MD

## 2018-01-14 NOTE — Patient Instructions (Addendum)
Flu shot and pneumovax today.  Increase metformin to 1000mg  twice daily - may take 2 tablets till you run out. Goal sugar range fasting is 80-120, and 2 hours after meal is 100-180.  Schedule eye doctor appointment.  Stop plavix. Just continue baby aspirin 81mg  daily.  Return in 6 weeks for follow up visit. We will write you out of work through then.

## 2018-01-14 NOTE — Assessment & Plan Note (Signed)
Appreciate neuro care - planning to start CPAP.

## 2018-01-14 NOTE — Assessment & Plan Note (Signed)
Will remain out of work through next evaluation mid October. Continues PT/OT/ST.

## 2018-01-14 NOTE — Assessment & Plan Note (Signed)
Chronic, stable. Continue current regimen. 

## 2018-01-14 NOTE — Assessment & Plan Note (Signed)
Has not started checking sugars yet - but hey have glucometer to start. Discussed goal ranges either fasting or post prandial. A1c remains elevated - will increase metformin to 1000mg  bid max dose. RTC 6 wks f/u visit.

## 2018-01-17 ENCOUNTER — Encounter: Payer: BLUE CROSS/BLUE SHIELD | Attending: Family Medicine | Admitting: *Deleted

## 2018-01-17 ENCOUNTER — Encounter: Payer: Self-pay | Admitting: *Deleted

## 2018-01-17 VITALS — BP 150/82 | Ht 68.0 in | Wt 239.4 lb

## 2018-01-17 DIAGNOSIS — Z713 Dietary counseling and surveillance: Secondary | ICD-10-CM | POA: Insufficient documentation

## 2018-01-17 DIAGNOSIS — E1149 Type 2 diabetes mellitus with other diabetic neurological complication: Secondary | ICD-10-CM | POA: Insufficient documentation

## 2018-01-17 NOTE — Patient Instructions (Signed)
Check blood sugars 1-2 x day before breakfast and/or 2 hrs after supper every day Bring blood sugar records to the next class  Exercise: Continue PT for  45  minutes 2 days a week and gradually increase to 150 minutes/week  Eat 3 meals day,   2  snacks a day Space meals 4-6 hours apart Don't skip meals Avoid sugar sweetened drinks (soda, juices)  Make an eye doctor appointment  Return for classes on:

## 2018-01-17 NOTE — Progress Notes (Signed)
Diabetes Self-Management Education  Visit Type: First/Initial  Appt. Start Time: 1335 Appt. End Time:  7106  01/17/2018  Mr. Abdur Hoglund, identified by name and date of birth, is a 57 y.o. male with a diagnosis of Diabetes: Type 2.   ASSESSMENT  Blood pressure (!) 150/82, height _0  (1.727 m), weight 239 lb 6.4 oz (108.6 kg). Body mass index is 36.4 kg/m.  Diabetes Self-Management Education - 01/17/18 1518      Visit Information   Visit Type  First/Initial      Initial Visit   Diabetes Type  Type 2    Are you currently following a meal plan?  No    Are you taking your medications as prescribed?  Yes    Date Diagnosed  2 months      Health Coping   How would you rate your overall health?  Fair      Psychosocial Assessment   Patient Belief/Attitude about Diabetes  Other (comment)   "not sure"   Self-care barriers  None    Self-management support  Doctor's office;Family    Other persons present  Spouse/SO    Patient Concerns  Nutrition/Meal planning;Glycemic Control;Medication;Monitoring;Healthy Lifestyle    Special Needs  None    Preferred Learning Style  Auditory;Visual;Hands on    [place]    How often do you need to have someone help you when you read instructions, pamphlets, or other written materials from your doctor or pharmacy?  2 - Rarely    What is the last grade level you completed in school?  12th       Pre-Education Assessment   Patient understands the diabetes disease and treatment process.  Needs Instruction    Patient understands incorporating nutritional management into lifestyle.  Needs Instruction    Patient undertands incorporating physical activity into lifestyle.  Needs Instruction    Patient understands using medications safely.  Needs Instruction    Patient understands monitoring blood glucose, interpreting and using results  Needs Review    Patient understands prevention, detection, and treatment of acute complications.   Needs Instruction    Patient understands prevention, detection, and treatment of chronic complications.  Needs Instruction    Patient understands how to develop strategies to address psychosocial issues.  Needs Instruction    Patient understands how to develop strategies to promote health/change behavior.  Needs Instruction      Complications   Last HgB A1C per patient/outside source  9.3 %   01/14/18   How often do you check your blood sugar?  0 times/day (not testing)   He has a meter but hasn't started using. BG in the office was 147 mg/dL at 2:40 pm - 3 hrs after drinking juice.    Have you had a dilated eye exam in the past 12 months?  No    Have you had a dental exam in the past 12 months?  No    Are you checking your feet?  Yes    How many days per week are you checking your feet?  7      Dietary Intake   Breakfast  skips or has a sausage biscuit    Lunch  skips or has something from vending machine like chips    Snack (afternoon)  chips or peanut butter crackers or cheese    Dinner  beef, chicken, pork, potatoes, mac-n-cheese, greens, cabbage, brussel sprouts    Beverage(s)  water, coffee, juices, regular soft drinks  Exercise   Exercise Type  Light (walking / raking leaves)    How many days per week to you exercise?  2    How many minutes per day do you exercise?  45    Total minutes per week of exercise  90      Patient Education   Previous Diabetes Education  No    Disease state   Definition of diabetes, type 1 and 2, and the diagnosis of diabetes;Factors that contribute to the development of diabetes    Nutrition management   Role of diet in the treatment of diabetes and the relationship between the three main macronutrients and blood glucose level;Food label reading, portion sizes and measuring food.;Reviewed blood glucose goals for pre and post meals and how to evaluate the patients' food intake on their blood glucose level.    Physical activity and exercise   Role  of exercise on diabetes management, blood pressure control and cardiac health.    Medications  Reviewed patients medication for diabetes, action, purpose, timing of dose and side effects.    Monitoring  Purpose and frequency of SMBG.;Taught/discussed recording of test results and interpretation of SMBG.;Identified appropriate SMBG and/or A1C goals.    Chronic complications  Relationship between chronic complications and blood glucose control    Psychosocial adjustment  Identified and addressed patients feelings and concerns about diabetes      Individualized Goals (developed by patient)   Reducing Risk  Improve blood sugars Decrease medications Prevent diabetes complications Lead a healthier lifestyle     Outcomes   Expected Outcomes  Demonstrated interest in learning. Expect positive outcomes    Future DMSE  4-6 wks       Individualized Plan for Diabetes Self-Management Training:   Learning Objective:  Patient will have a greater understanding of diabetes self-management. Patient education plan is to attend individual and/or group sessions per assessed needs and concerns.   Plan:   Patient Instructions  Check blood sugars 1-2 x day before breakfast and/or 2 hrs after supper every day Bring blood sugar records to the next class Exercise: Continue PT for  45  minutes 2 days a week and gradually increase to 150 minutes/week Eat 3 meals day,   2  snacks a day Space meals 4-6 hours apart Don't skip meals Avoid sugar sweetened drinks (soda, juices) Make an eye doctor appointment  Expected Outcomes:  Demonstrated interest in learning. Expect positive outcomes  Education material provided:  General Meal Planning Guidelines Simple Meal Plan  If problems or questions, patient to contact team via:  Johny Drilling, [place], [place], CDE [phone]  Future DSME appointment: 4-6 wks  Monday February 14, 2018 for Diabetes Class 1

## 2018-01-18 ENCOUNTER — Ambulatory Visit: Payer: BLUE CROSS/BLUE SHIELD | Attending: Family Medicine | Admitting: Speech Pathology

## 2018-01-21 ENCOUNTER — Ambulatory Visit: Payer: BLUE CROSS/BLUE SHIELD

## 2018-01-23 ENCOUNTER — Other Ambulatory Visit: Payer: Self-pay | Admitting: Family Medicine

## 2018-02-03 LAB — HM DIABETES EYE EXAM

## 2018-02-07 ENCOUNTER — Encounter: Payer: Self-pay | Admitting: Family Medicine

## 2018-02-14 ENCOUNTER — Encounter: Payer: BLUE CROSS/BLUE SHIELD | Attending: Family Medicine

## 2018-02-14 DIAGNOSIS — Z713 Dietary counseling and surveillance: Secondary | ICD-10-CM | POA: Insufficient documentation

## 2018-02-14 DIAGNOSIS — E1149 Type 2 diabetes mellitus with other diabetic neurological complication: Secondary | ICD-10-CM | POA: Insufficient documentation

## 2018-02-18 ENCOUNTER — Telehealth: Payer: Self-pay | Admitting: Dietician

## 2018-02-18 NOTE — Telephone Encounter (Signed)
Called patient to rescheduled class series, as he missed class 1 on 02/14/18. He rescheduled the series to begin on 03/14/18.

## 2018-02-21 ENCOUNTER — Ambulatory Visit: Payer: BLUE CROSS/BLUE SHIELD

## 2018-02-22 ENCOUNTER — Encounter: Payer: Self-pay | Admitting: Family Medicine

## 2018-02-22 ENCOUNTER — Ambulatory Visit: Payer: BLUE CROSS/BLUE SHIELD | Admitting: Family Medicine

## 2018-02-22 VITALS — BP 124/70 | HR 92 | Temp 98.7°F | Ht 68.0 in | Wt 235.0 lb

## 2018-02-22 DIAGNOSIS — R42 Dizziness and giddiness: Secondary | ICD-10-CM

## 2018-02-22 DIAGNOSIS — I10 Essential (primary) hypertension: Secondary | ICD-10-CM | POA: Diagnosis not present

## 2018-02-22 DIAGNOSIS — E1165 Type 2 diabetes mellitus with hyperglycemia: Secondary | ICD-10-CM | POA: Diagnosis not present

## 2018-02-22 DIAGNOSIS — R112 Nausea with vomiting, unspecified: Secondary | ICD-10-CM | POA: Diagnosis not present

## 2018-02-22 DIAGNOSIS — I69354 Hemiplegia and hemiparesis following cerebral infarction affecting left non-dominant side: Secondary | ICD-10-CM

## 2018-02-22 DIAGNOSIS — R197 Diarrhea, unspecified: Secondary | ICD-10-CM

## 2018-02-22 DIAGNOSIS — I6381 Other cerebral infarction due to occlusion or stenosis of small artery: Secondary | ICD-10-CM

## 2018-02-22 DIAGNOSIS — E1149 Type 2 diabetes mellitus with other diabetic neurological complication: Secondary | ICD-10-CM | POA: Diagnosis not present

## 2018-02-22 DIAGNOSIS — I6930 Unspecified sequelae of cerebral infarction: Secondary | ICD-10-CM

## 2018-02-22 DIAGNOSIS — Z1159 Encounter for screening for other viral diseases: Secondary | ICD-10-CM | POA: Diagnosis not present

## 2018-02-22 DIAGNOSIS — IMO0002 Reserved for concepts with insufficient information to code with codable children: Secondary | ICD-10-CM

## 2018-02-22 LAB — COMPREHENSIVE METABOLIC PANEL
ALT: 31 U/L (ref 0–53)
AST: 23 U/L (ref 0–37)
Albumin: 4.2 g/dL (ref 3.5–5.2)
Alkaline Phosphatase: 104 U/L (ref 39–117)
BUN: 22 mg/dL (ref 6–23)
CO2: 32 mEq/L (ref 19–32)
Calcium: 9.7 mg/dL (ref 8.4–10.5)
Chloride: 97 mEq/L (ref 96–112)
Creatinine, Ser: 1.09 mg/dL (ref 0.40–1.50)
GFR: 90.28 mL/min (ref >60.00)
Glucose, Bld: 176 mg/dL — ABNORMAL HIGH (ref 70–99)
Potassium: 3.3 mEq/L — ABNORMAL LOW (ref 3.5–5.1)
Sodium: 136 mEq/L (ref 135–145)
Total Bilirubin: 0.6 mg/dL (ref 0.2–1.2)
Total Protein: 7.8 g/dL (ref 6.0–8.3)

## 2018-02-22 LAB — CBC WITH DIFFERENTIAL/PLATELET
BASOS ABS: 0 10*3/uL (ref 0.0–0.1)
Basophils Relative: 0.6 % (ref 0.0–3.0)
EOS PCT: 2.2 % (ref 0.0–5.0)
Eosinophils Absolute: 0.1 10*3/uL (ref 0.0–0.7)
HEMATOCRIT: 41.9 % (ref 39.0–52.0)
HEMOGLOBIN: 14.1 g/dL (ref 13.0–17.0)
LYMPHS PCT: 28.3 % (ref 12.0–46.0)
Lymphs Abs: 1.7 10*3/uL (ref 0.7–4.0)
MCHC: 33.7 g/dL (ref 30.0–36.0)
MCV: 86.9 fl (ref 78.0–100.0)
MONOS PCT: 10 % (ref 3.0–12.0)
Monocytes Absolute: 0.6 10*3/uL (ref 0.1–1.0)
Neutro Abs: 3.5 10*3/uL (ref 1.4–7.7)
Neutrophils Relative %: 58.9 % (ref 43.0–77.0)
Platelets: 277 10*3/uL (ref 150.0–400.0)
RBC: 4.82 Mil/uL (ref 4.22–5.81)
RDW: 14.2 % (ref 11.5–15.5)
WBC: 6 10*3/uL (ref 4.0–10.5)

## 2018-02-22 LAB — LIPASE: LIPASE: 27 U/L (ref 11.0–59.0)

## 2018-02-22 MED ORDER — ONDANSETRON HCL 4 MG PO TABS: 4.0000 mg | ORAL_TABLET | Freq: Three times a day (TID) | ORAL | 0 refills | Status: DC | PRN

## 2018-02-22 MED ORDER — ONDANSETRON 4 MG PO TBDP
4.0000 mg | ORAL_TABLET | Freq: Once | ORAL | Status: AC
  Administered 2018-02-22: 4 mg via ORAL

## 2018-02-22 NOTE — Progress Notes (Signed)
BP 124/70 (BP Location: Right Arm, Patient Position: Sitting, Cuff Size: Large)   Pulse 92   Temp 98.7 F (37.1 C) (Oral)   Ht 5' 8"  (1.727 m)   Wt 235 lb (106.6 kg)   SpO2 95%   BMI 35.73 kg/m    CC: 1 mo f/u visit Subjective:    Patient ID: Thomas Howard, male    DOB: 1961-02-20, 57 y.o.   MRN: [id number]  HPI: Thomas Howard is a 57 y.o. male presenting on 02/22/2018 for Follow-up (Here for stroke f/u in 09/2017. Pt accompanied by his wife. )   Lacunar CVA with residual neurological deficits 09/2017 - completed outpatient PT/OT/ST 01/2018. Plavix stopped, only on aspirin 77m daily. Persistent vertigo since CVA.   2d h/o nausea/vomiting, with watery diarrhea x1 at 1am and vomiting last night at same time. No fevers/chills. Worse vertigo this morning. No sick contacts at home. No recent abx use. NBNB. No blood in stool. No indigestion, gassiness or bloating.   DM - does not regularly check sugars - has not checked sugars. Compliant with antihyperglycemic regimen which includes: metformin 10049mbid. Denies low sugars or hypoglycemic symptoms. Denies paresthesias. Last diabetic eye exam 01/2018. Pneumovax: 01/2018. Prevnar: not due. Glucometer brand: one-touch. DSME: scheduled next month. Lab Results  Component Value Date   HGBA1C 9.3 (A) 01/14/2018   Diabetic Foot Exam - Simple   No data filed     No results found for: MIDerl [place] Relevant past medical, surgical, family and social history reviewed and updated as indicated. Interim medical history since our last visit reviewed. Allergies and medications reviewed and updated. Outpatient Medications Prior to Visit  Medication Sig Dispense Refill  . amLODipine (NORVASC) 10 MG tablet TAKE 1 TABLET BY MOUTH EVERY DAY 30 tablet 2  . aspirin 81 MG EC tablet Take 81 mg by mouth daily.  0  . atorvastatin (LIPITOR) 80 MG tablet Take 1 tablet (80 mg total) by mouth at bedtime. 90 tablet 3  . Blood Glucose Monitoring Suppl (ONE  TOUCH ULTRA 2) w/Device KIT Use as directed E11.49 1 each 0  . glucose blood (ONE TOUCH ULTRA TEST) test strip Use as instructed to check sugars BID E11.49 100 each 3  . hydrochlorothiazide (HYDRODIURIL) 50 MG tablet Take 0.5 tablets (25 mg total) by mouth daily.    . Lancets (ONETOUCH ULTRASOFT) lancets Use as instructed to check sugars BID E11.49 100 each 3  . lisinopril (PRINIVIL,ZESTRIL) 40 MG tablet Take 1 tablet (40 mg total) by mouth daily. 90 tablet 3  . metFORMIN (GLUCOPHAGE) 1000 MG tablet Take 1 tablet (1,000 mg total) by mouth 2 (two) times daily with a meal. 60 tablet 11   No facility-administered medications prior to visit.      Per HPI unless specifically indicated in ROS section below Review of Systems     Objective:    BP 124/70 (BP Location: Right Arm, Patient Position: Sitting, Cuff Size: Large)   Pulse 92   Temp 98.7 F (37.1 C) (Oral)   Ht 5' 8"  (1.727 m)   Wt 235 lb (106.6 kg)   SpO2 95%   BMI 35.73 kg/m   Wt Readings from Last 3 Encounters:  02/22/18 235 lb (106.6 kg)  01/17/18 239 lb 6.4 oz (108.6 kg)  01/14/18 237 lb 4 oz (107.6 kg)    Physical Exam  Constitutional: He appears well-developed and well-nourished. No distress.  HENT:  Head: Normocephalic and atraumatic.  Right Ear: External ear normal.  Left Ear: External ear normal.  Nose: Nose normal.  Mouth/Throat: Oropharynx is clear and moist. No oropharyngeal exudate.  Eyes: Pupils are equal, round, and reactive to light. Conjunctivae and EOM are normal. No scleral icterus.  Neck: Normal range of motion. Neck supple.  Cardiovascular: Normal rate, regular rhythm, normal heart sounds and intact distal pulses.  No murmur heard. Pulmonary/Chest: Effort normal and breath sounds normal. No respiratory distress. He has no wheezes. He has no rales.  Abdominal: Soft. Bowel sounds are normal. He exhibits no distension and no mass. There is no hepatosplenomegaly. There is tenderness in the epigastric area.  There is no rebound, no guarding, no CVA tenderness and negative Murphy's sign. No hernia.  Musculoskeletal: He exhibits no edema.  See HPI for foot exam if done  Lymphadenopathy:    He has no cervical adenopathy.  Skin: Skin is warm and dry. No rash noted.  Psychiatric: He has a normal mood and affect.  Nursing note and vitals reviewed.  Results for orders placed or performed in visit on 02/07/18  HM DIABETES EYE EXAM  Result Value Ref Range   HM Diabetic Eye Exam No Retinopathy No Retinopathy   Lab Results  Component Value Date   CREATININE 1.04 10/14/2017   BUN 23 10/14/2017   NA 134 (L) 10/14/2017   K 3.3 (L) 10/14/2017   CL 98 10/14/2017   CO2 28 10/14/2017    Lab Results  Component Value Date   WBC 6.9 09/22/2017   HGB 13.3 (A) 09/22/2017   HCT 44.4 06/20/2013   MCV 89.1 06/20/2013   PLT 224 09/22/2017       Assessment & Plan:   Problem List Items Addressed This Visit    Vertigo    Persistent since lacunar CVA.       Uncontrolled type 2 diabetes mellitus with neurologic complication, without long-term current use of insulin ([place]) - Primary    Has not been checking sugars. On metformin 1032m bid. Update fructosamine today. Discussed possible addition of SLGT2 inhibitor, mechanism of action, and side effects/adverse effects to montitor.       Relevant Orders   Fructosamine   Nausea vomiting and diarrhea    Doubt infectious cause. ?metformin related although fairly recent onset - check labs for further eval. zofran for nausea.       Relevant Medications   ondansetron (ZOFRAN-ODT) disintegrating tablet 4 mg (Completed)   Other Relevant Orders   Comprehensive metabolic panel   CBC with Differential/Platelet   Lipase   Lacunar stroke of right subthalamic region ([place]   HTN (hypertension)    Chronic, stable. Continue current regimen.       History of hemorrhagic cerebrovascular accident (CVA) with residual deficit    Completed outpatient therapy - states  he was recommended to remain out of work and to apply for disability - they are in the process of doing this and have questions about disability evaluation - advised I would fill out STD as needed but recommended formal disability evaluation and considering hiring lawyer if long term disability planned.      Hemiplegia and hemiparesis following cerebral infarction affecting left non-dominant side (HCC)    Other Visit Diagnoses    Need for hepatitis C screening test       Relevant Orders   Hepatitis C antibody       Meds ordered this encounter  Medications  . ondansetron (ZOFRAN) 4 MG tablet    Sig: Take 1 tablet (4 mg total) by  mouth every 8 (eight) hours as needed for nausea or vomiting.    Dispense:  20 tablet    Refill:  0  . ondansetron (ZOFRAN-ODT) disintegrating tablet 4 mg   Orders Placed This Encounter  Procedures  . Comprehensive metabolic panel  . CBC with Differential/Platelet  . Lipase  . Fructosamine  . Hepatitis C antibody    Follow up plan: Return in about 6 weeks (around 04/05/2018) for follow up visit.  Ria Bush, MD

## 2018-02-22 NOTE — Assessment & Plan Note (Addendum)
Doubt infectious cause. ?metformin related although fairly recent onset - check labs for further eval. zofran for nausea.

## 2018-02-22 NOTE — Patient Instructions (Addendum)
Labs today zofran for nausea - I will send this to your pharmacy as well.  We will be in touch with lab results and plan for diabetes management - may add second diabetes medicine.  Return in 6 wks for follow up visit.

## 2018-02-22 NOTE — Assessment & Plan Note (Signed)
Completed outpatient therapy - states he was recommended to remain out of work and to apply for disability - they are in the process of doing this and have questions about disability evaluation - advised I would fill out STD as needed but recommended formal disability evaluation and considering hiring lawyer if long term disability planned.

## 2018-02-22 NOTE — Assessment & Plan Note (Signed)
Chronic, stable. Continue current regimen. 

## 2018-02-22 NOTE — Assessment & Plan Note (Signed)
Persistent since lacunar CVA.

## 2018-02-22 NOTE — Assessment & Plan Note (Signed)
Has not been checking sugars. On metformin 1000mg  bid. Update fructosamine today. Discussed possible addition of SLGT2 inhibitor, mechanism of action, and side effects/adverse effects to montitor.

## 2018-02-24 ENCOUNTER — Encounter: Payer: Self-pay | Admitting: Family Medicine

## 2018-02-26 ENCOUNTER — Other Ambulatory Visit: Payer: Self-pay | Admitting: Family Medicine

## 2018-02-26 MED ORDER — POTASSIUM CHLORIDE ER 10 MEQ PO TBCR: 10.0000 meq | EXTENDED_RELEASE_TABLET | Freq: Every day | ORAL | 3 refills | Status: DC

## 2018-02-26 NOTE — Telephone Encounter (Signed)
See prior mychart message.

## 2018-02-26 NOTE — Telephone Encounter (Signed)
Letter written and in chart. plz send to [employer] - and touch base with pt/partner.

## 2018-02-28 ENCOUNTER — Ambulatory Visit: Payer: BLUE CROSS/BLUE SHIELD

## 2018-02-28 LAB — FRUCTOSAMINE: FRUCTOSAMINE: 334 umol/L — AB (ref 190–270)

## 2018-02-28 LAB — HEPATITIS C ANTIBODY
Hepatitis C Ab: NONREACTIVE
SIGNAL TO CUT-OFF: 0.1 (ref <1.00)

## 2018-02-28 NOTE — Telephone Encounter (Signed)
Printed letter.  Notified pt, via MyChart, the letter is ready to pick up.  [Placed letter at front office.]

## 2018-03-02 ENCOUNTER — Other Ambulatory Visit: Payer: Self-pay | Admitting: Family Medicine

## 2018-03-02 MED ORDER — EMPAGLIFLOZIN 10 MG PO TABS: 10.0000 mg | ORAL_TABLET | Freq: Every day | ORAL | 6 refills | Status: DC

## 2018-03-03 DIAGNOSIS — G4733 Obstructive sleep apnea (adult) (pediatric): Secondary | ICD-10-CM | POA: Diagnosis not present

## 2018-03-10 ENCOUNTER — Telehealth: Payer: Self-pay | Admitting: *Deleted

## 2018-03-10 NOTE — Telephone Encounter (Signed)
LMVM for him to call back about his cpap. Has appt tomorrow for compliance and it looks like not using it.

## 2018-03-11 ENCOUNTER — Encounter: Payer: BLUE CROSS/BLUE SHIELD | Admitting: Adult Health

## 2018-03-11 ENCOUNTER — Telehealth: Payer: Self-pay | Admitting: Adult Health

## 2018-03-11 NOTE — Progress Notes (Signed)
This encounter was created in error - please disregard.

## 2018-03-11 NOTE — Telephone Encounter (Signed)
Patient came in for an initial cpap visit. Patient has not been using CPAP so Andrey Campanile RN brough patient to check-out and asked [place] to apply patient's copay to his next visit.

## 2018-03-11 NOTE — Telephone Encounter (Signed)
Pt here for initial cpap visit, just started using cpap last Friday, was having problems with mask.  Has appt with [facility] for new mask and fitting today.  Will need appt in 31 days after using cpap.

## 2018-03-14 ENCOUNTER — Ambulatory Visit: Payer: BLUE CROSS/BLUE SHIELD

## 2018-03-15 ENCOUNTER — Encounter: Payer: Self-pay | Admitting: Dietician

## 2018-03-15 NOTE — Progress Notes (Unsigned)
Patient did not attend class 1 yesterday, 03/14/18, as scheduled. This is the second missed class series.

## 2018-03-21 ENCOUNTER — Ambulatory Visit: Payer: BLUE CROSS/BLUE SHIELD

## 2018-03-22 ENCOUNTER — Encounter: Payer: Self-pay | Admitting: *Deleted

## 2018-03-24 ENCOUNTER — Ambulatory Visit: Payer: BLUE CROSS/BLUE SHIELD

## 2018-03-28 ENCOUNTER — Ambulatory Visit: Payer: BLUE CROSS/BLUE SHIELD

## 2018-03-31 ENCOUNTER — Ambulatory Visit: Payer: BLUE CROSS/BLUE SHIELD

## 2018-04-03 DIAGNOSIS — G4733 Obstructive sleep apnea (adult) (pediatric): Secondary | ICD-10-CM | POA: Diagnosis not present

## 2018-04-11 NOTE — Telephone Encounter (Signed)
I made appt for pt on 04/25/2018 at 1430, arrive 1400 for initial cpap.  Wife took call and will let pt know.

## 2018-04-14 ENCOUNTER — Ambulatory Visit: Payer: BLUE CROSS/BLUE SHIELD

## 2018-04-22 ENCOUNTER — Other Ambulatory Visit: Payer: Self-pay | Admitting: Family Medicine

## 2018-04-24 ENCOUNTER — Encounter: Payer: Self-pay | Admitting: Neurology

## 2018-04-25 ENCOUNTER — Ambulatory Visit: Payer: Self-pay | Admitting: Adult Health

## 2018-04-25 NOTE — Progress Notes (Deleted)
PATIENT: Thomas Howard DOB: 09-17-1960  REASON FOR VISIT: follow up HISTORY FROM: patient  HISTORY OF PRESENT ILLNESS: Today 04/25/18  HISTORY [name] is a 57 y.o. male , seen here in a referral from Dr. Danise Mina for a sleep apnea evaluation. The patient has a history of DM, HTN and developed headaches on 09-22-2017, with facial droop and dysarthria- was evaluated in [place]  and diagnosed with a lacunar basal ganglia  non - hemorrhagic stroke. [name] is seen here today in the presence of his wife, and this visit is meant to address possible risk factors for future strokes.  Since lacunar infarcts are usually closely related to small and mid size vessel disease with the main risk factor being hypertension and secondary risk factors of diabetes or migraine it is also also important to eliminate sleep apnea.  I explained that treatment of sleep apnea reduces hypertension, and increases the insulin production.  The patient had undergone a sleep study several years ago in [place] at the [place], must have been diagnosed with obstructive sleep apnea at the time as he was issued a CPAP with a full facemask which she could not tolerate, he stated that the air was too dry irritated his airway, he often inadvertently and unintentionally removed the mask at night during his sleep.  He has not used a CPAP in several years.    Sleep habits are as follows: He usually retreats to the bathroom between 9 and 10 PM.  Prior to going to sleep he spends his evening watching TV in the living room or then.  The marital bedroom is described as cool, quiet and dark.  The patient prefers prone sleep, using one pillow.  He has no trouble initiating sleep and according to his wife began snoring almost immediately.  Snoring is louder for her to sleep on his back.  Sometimes he also drools.  He is usually up at 2 AM for bathroom break and then again around 4- 5 AM.  He reports dreaming often and vividly.  He has been  acting out some of his more lucid dreams, kicking or yelling or thrashing about.  He has not been a sleep walker.  These spells occur while he is in bed and usually time-limited to 2 or 3 minutes.  He does not remember most of these. His usual rise time is around 6 AM, he wakes spontaneously at that time.  He does not feel rested and fully restored, and has always felt he needs more sleep.  He is day with a baseline sense of fatigue, dizziness, now that he is currently not working he is back in bed by 9 AM for another couple of hours of sleep.  Altogether his total sleep time may average about 8 to 10 hours.    Sleep medical history and family sleep history:  diagnosed with OSA at [facility] " a long time " ago, about 8-10 years. Has a CPAP of same age. FFM - non compliant.  Borderline diabetic, had nutrional teaching, cholesterol is high, HTN.   Social history: married, currently out of work, physical job- In [place]. 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  Children - 2 sons age 42 and 48.  Tobacco smoker- quit but vapes for the last 5 years. ETOH- less than once a month. Caffeine- sodas , drinks 8 at work- and occassionally iced tea or coffee. First shift Insurance underwriter.     REVIEW OF SYSTEMS: Out of  a complete 14 system review of symptoms, the patient complains only of the following symptoms, and all other reviewed systems are negative.  ALLERGIES: No Known Allergies  HOME MEDICATIONS: Outpatient Medications Prior to Visit  Medication Sig Dispense Refill  . amLODipine (NORVASC) 10 MG tablet TAKE 1 TABLET BY MOUTH EVERY DAY 30 tablet 2  . aspirin 81 MG EC tablet Take 81 mg by mouth daily.  0  . atorvastatin (LIPITOR) 80 MG tablet Take 1 tablet (80 mg total) by mouth at bedtime. 90 tablet 3  . Blood Glucose Monitoring Suppl (ONE TOUCH ULTRA 2) w/Device KIT Use as directed E11.49 1 each 0  . empagliflozin (JARDIANCE) 10 MG TABS tablet Take 10 mg by mouth daily. 30  tablet 6  . glucose blood (ONE TOUCH ULTRA TEST) test strip Use as instructed to check sugars BID E11.49 100 each 3  . hydrochlorothiazide (HYDRODIURIL) 50 MG tablet Take 0.5 tablets (25 mg total) by mouth daily.    . Lancets (ONETOUCH ULTRASOFT) lancets Use as instructed to check sugars BID E11.49 100 each 3  . lisinopril (PRINIVIL,ZESTRIL) 40 MG tablet Take 1 tablet (40 mg total) by mouth daily. 90 tablet 3  . metFORMIN (GLUCOPHAGE) 1000 MG tablet Take 1 tablet (1,000 mg total) by mouth 2 (two) times daily with a meal. 60 tablet 11  . ondansetron (ZOFRAN) 4 MG tablet Take 1 tablet (4 mg total) by mouth every 8 (eight) hours as needed for nausea or vomiting. 20 tablet 0  . potassium chloride (K-DUR) 10 MEQ tablet Take 1 tablet (10 mEq total) by mouth daily. 90 tablet 3   No facility-administered medications prior to visit.     PAST MEDICAL HISTORY: Past Medical History:  Diagnosis Date  . Diabetes mellitus without complication ([place])   . History of smoking   . HTN (hypertension)   . Hyperlipemia   . Obesity   . Sleep apnea   . Stroke ([place])   . Wears partial dentures     PAST SURGICAL HISTORY: Past Surgical History:  Procedure Laterality Date  . MOUTH SURGERY    . WISDOM TOOTH EXTRACTION      FAMILY HISTORY: Family History  Problem Relation Age of Onset  . Diabetes Mother   . Hypertension Mother   . Diabetes Maternal Grandmother   . Hypertension Maternal Grandmother   . Coronary artery disease Neg Hx   . Stroke Neg Hx   . Cancer Neg Hx     SOCIAL HISTORY: Social History   Socioeconomic History  . Marital status: Single    Spouse name: Not on file  . Number of children: Not on file  . Years of education: Not on file  . Highest education level: Not on file  Occupational History  . Not on file  Social Needs  . Financial resource strain: Not on file  . Food insecurity:    Worry: Not on file    Inability: Not on file  . Transportation needs:    Medical: Not on  file    Non-medical: Not on file  Tobacco Use  . Smoking status: Current Every Day Smoker    Packs/day: 1.00    Years: 20.00    Pack years: 20.00    Types: Cigarettes  . Smokeless tobacco: Never Used  . Tobacco comment: vaped for 5 years  Substance and Sexual Activity  . Alcohol use: Yes    Alcohol/week: 1.0 standard drinks    Types: 1 Shots of liquor per week  Comment: Occasional - not on a weekly basis  . Drug use: No  . Sexual activity: Not on file  Lifestyle  . Physical activity:    Days per week: Not on file    Minutes per session: Not on file  . Stress: Not on file  Relationships  . Social connections:    Talks on phone: Not on file    Gets together: Not on file    Attends religious service: Not on file    Active member of club or organization: Not on file    Attends meetings of clubs or organizations: Not on file    Relationship status: Not on file  . Intimate partner violence:    Fear of current or ex partner: Not on file    Emotionally abused: Not on file    Physically abused: Not on file    Forced sexual activity: Not on file  Other Topics Concern  . Not on file  Social History Narrative   Caffeine: sodas - 6 pack/day (pepsi, mt dew, ginger ale)   Lives with GF Joline Salt), 1 cat.  Divorced.  Children 2.   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 There is no height or weight on file to calculate BMI.  Generalized: Well developed, in no acute distress   Neurological examination  Mentation: Alert oriented to time, place, history taking. Follows all commands speech and language fluent Cranial nerve II-XII: Pupils were equal round reactive to light. Extraocular movements were full, visual field were full on confrontational test. Facial sensation and strength were normal. Uvula tongue midline. Head turning  and shoulder shrug  were normal and symmetric. Motor: The motor testing reveals 5 over 5 strength of all 4 extremities. Good symmetric motor tone is noted throughout.  Sensory: Sensory testing is intact to soft touch on all 4 extremities. No evidence of extinction is noted.  Coordination: Cerebellar testing reveals good finger-nose-finger and heel-to-shin bilaterally.  Gait and station: Gait is normal. Tandem gait is normal. Romberg is negative. No drift is seen.  Reflexes: Deep tendon reflexes are symmetric and normal bilaterally.   DIAGNOSTIC DATA (LABS, IMAGING, TESTING) - I reviewed patient records, labs, notes, testing and imaging myself where available.  Lab Results  Component Value Date   WBC 6.0 02/22/2018   HGB 14.1 02/22/2018   HCT 41.9 02/22/2018   MCV 86.9 02/22/2018   PLT 277.0 02/22/2018      Component Value Date/Time   NA 136 02/22/2018 1315   K 3.3 (L) 02/22/2018 1315   CL 97 02/22/2018 1315   CO2 32 02/22/2018 1315   GLUCOSE 176 (H) 02/22/2018 1315   BUN 22 02/22/2018 1315   CREATININE 1.09 02/22/2018 1315   CALCIUM 9.7 02/22/2018 1315   PROT 7.8 02/22/2018 1315   ALBUMIN 4.2 02/22/2018 1315   AST 23 02/22/2018 1315   ALT 31 02/22/2018 1315   ALKPHOS 104 02/22/2018 1315   BILITOT 0.6 02/22/2018 1315   Lab Results  Component Value Date   CHOL 193 09/22/2017   HDL 41 09/22/2017   LDLCALC 126 09/22/2017   TRIG 123 09/22/2017   CHOLHDL 5 06/20/2013   Lab Results  Component Value Date   HGBA1C 9.3 (A) 01/14/2018   No results found for: [id number] Lab Results  Component Value Date   TSH 2.06  10/07/2017      ASSESSMENT AND PLAN 57 y.o. year old male  has a past medical history of Diabetes mellitus without complication ([place]), History of smoking, HTN (hypertension), Hyperlipemia, Obesity, Sleep apnea, Stroke ([place]), and Wears partial dentures. here with ***   I spent 15 minutes with the patient. 50% of this time was spent   Ward Givens, MSN,  NP-C 04/25/2018, 11:58 AM [facility] [address] [place], [place] [address] [phone]

## 2018-04-26 ENCOUNTER — Encounter: Payer: Self-pay | Admitting: Adult Health

## 2018-05-03 DIAGNOSIS — G4733 Obstructive sleep apnea (adult) (pediatric): Secondary | ICD-10-CM | POA: Diagnosis not present

## 2018-05-11 ENCOUNTER — Encounter: Payer: Self-pay | Admitting: Family Medicine

## 2018-05-11 NOTE — Progress Notes (Signed)
BP (!) 148/72 (BP Location: Right Arm, Patient Position: Sitting, Cuff Size: Large)   Pulse 84   Temp 97.8 F (36.6 C) (Oral)   Ht 5' 8"  (1.727 m)   Wt 242 lb (109.8 kg)   SpO2 95%   BMI 36.80 kg/m    CC: L hand swelling Subjective:    Patient ID: Thomas Howard, male    DOB: 03/20/1961, 58 y.o.   MRN: [id number]  HPI: Thomas Howard is a 58 y.o. male presenting on 05/12/2018 for Left Hand Swollen   Suffered lacunar CVA 09/2017 with residual neurological deficits (L hemiparesis), retired 05/11/2018.  Only on aspirin now. Last visit we started jardiance 42m in addition to metformin - tolerating this well without UTI or yeast infection symptoms.  Lab Results  Component Value Date   HGBA1C 7.6 (A) 05/12/2018     Over the last week has noted increasing pain, swelling of L hand. Associated with worsening paresthesias/numbness. This may have started after partner kneed his left hand. No redness or warmth of hand. No recent cuts to skin. Taking aleve and bengay which has helped. No h/o gout.      Relevant past medical, surgical, family and social history reviewed and updated as indicated. Interim medical history since our last visit reviewed. Allergies and medications reviewed and updated. Outpatient Medications Prior to Visit  Medication Sig Dispense Refill  . amLODipine (NORVASC) 10 MG tablet TAKE 1 TABLET BY MOUTH EVERY DAY 30 tablet 2  . aspirin 81 MG EC tablet Take 81 mg by mouth daily.  0  . atorvastatin (LIPITOR) 80 MG tablet Take 1 tablet (80 mg total) by mouth at bedtime. 90 tablet 3  . Blood Glucose Monitoring Suppl (ONE TOUCH ULTRA 2) w/Device KIT Use as directed E11.49 1 each 0  . glucose blood (ONE TOUCH ULTRA TEST) test strip Use as instructed to check sugars BID E11.49 100 each 3  . hydrochlorothiazide (HYDRODIURIL) 50 MG tablet Take 0.5 tablets (25 mg total) by mouth daily.    . Lancets (ONETOUCH ULTRASOFT) lancets Use as instructed to check sugars BID E11.49 100 each 3    . lisinopril (PRINIVIL,ZESTRIL) 40 MG tablet Take 1 tablet (40 mg total) by mouth daily. 90 tablet 3  . metFORMIN (GLUCOPHAGE) 1000 MG tablet Take 1 tablet (1,000 mg total) by mouth 2 (two) times daily with a meal. 60 tablet 11  . ondansetron (ZOFRAN) 4 MG tablet Take 1 tablet (4 mg total) by mouth every 8 (eight) hours as needed for nausea or vomiting. 20 tablet 0  . potassium chloride (K-DUR) 10 MEQ tablet Take 1 tablet (10 mEq total) by mouth daily. 90 tablet 3  . empagliflozin (JARDIANCE) 10 MG TABS tablet Take 10 mg by mouth daily. 30 tablet 6   No facility-administered medications prior to visit.      Per HPI unless specifically indicated in ROS section below Review of Systems Objective:    BP (!) 148/72 (BP Location: Right Arm, Patient Position: Sitting, Cuff Size: Large)   Pulse 84   Temp 97.8 F (36.6 C) (Oral)   Ht 5' 8"  (1.727 m)   Wt 242 lb (109.8 kg)   SpO2 95%   BMI 36.80 kg/m   Wt Readings from Last 3 Encounters:  05/12/18 242 lb (109.8 kg)  02/22/18 235 lb (106.6 kg)  01/17/18 239 lb 6.4 oz (108.6 kg)    Physical Exam Vitals signs and nursing note reviewed.  Constitutional:      Appearance: Normal  appearance.  Musculoskeletal:        General: Tenderness present. No swelling.     Comments: 4+ rad pulses bilaterally Tender to palpation L anterior wrist Tender with forced flexion L wrist No pain at scaphoid or 1st CMC No pain at MCPs or IPs.   Skin:    Findings: No rash.  Neurological:     Mental Status: He is alert.       Results for orders placed or performed in visit on 05/12/18  POCT glycosylated hemoglobin (Hb A1C)  Result Value Ref Range   Hemoglobin A1C 7.6 (A) 4.0 - 5.6 %   HbA1c POC (<> result, manual entry)     HbA1c, POC (prediabetic range)     HbA1c, POC (controlled diabetic range)     Assessment & Plan:   Problem List Items Addressed This Visit    Uncontrolled type 2 diabetes mellitus with neurologic complication, without long-term  current use of insulin (HCC)    Tolerating metformin and jardiance - update A1c, consider titrating SLGT2       Relevant Medications   empagliflozin (JARDIANCE) 25 MG TABS tablet   Other Relevant Orders   POCT glycosylated hemoglobin (Hb A1C) (Completed)   Left wrist pain - Primary    Anticipate wrist sprain - rec scheduled aleve BID x 1 wk, continue ben gay cream, discussed gentle stretching, update if no better to consider hand surgery eval. Pt agrees with plan.       History of hemorrhagic cerebrovascular accident (CVA) with residual deficit   Hemiplegia and hemiparesis following cerebral infarction affecting left non-dominant side ([place])   Gout    Endorses h/o this. Will Rx colchicine PRN gout attack.           Meds ordered this encounter  Medications  . colchicine 0.6 MG tablet    Sig: Take 1 tablet (0.6 mg total) by mouth daily as needed (gout flare).    Dispense:  30 tablet    Refill:  1  . empagliflozin (JARDIANCE) 25 MG TABS tablet    Sig: Take 25 mg by mouth daily.    Dispense:  30 tablet    Refill:  6   Orders Placed This Encounter  Procedures  . POCT glycosylated hemoglobin (Hb A1C)    Follow up plan: No follow-ups on file.  Ria Bush, MD

## 2018-05-12 ENCOUNTER — Encounter: Payer: Self-pay | Admitting: Family Medicine

## 2018-05-12 ENCOUNTER — Ambulatory Visit: Payer: BLUE CROSS/BLUE SHIELD | Admitting: Family Medicine

## 2018-05-12 VITALS — BP 148/72 | HR 84 | Temp 97.8°F | Ht 68.0 in | Wt 242.0 lb

## 2018-05-12 DIAGNOSIS — I6930 Unspecified sequelae of cerebral infarction: Secondary | ICD-10-CM | POA: Diagnosis not present

## 2018-05-12 DIAGNOSIS — M25532 Pain in left wrist: Secondary | ICD-10-CM | POA: Insufficient documentation

## 2018-05-12 DIAGNOSIS — E1149 Type 2 diabetes mellitus with other diabetic neurological complication: Secondary | ICD-10-CM

## 2018-05-12 DIAGNOSIS — I69354 Hemiplegia and hemiparesis following cerebral infarction affecting left non-dominant side: Secondary | ICD-10-CM | POA: Diagnosis not present

## 2018-05-12 DIAGNOSIS — E1165 Type 2 diabetes mellitus with hyperglycemia: Secondary | ICD-10-CM | POA: Diagnosis not present

## 2018-05-12 DIAGNOSIS — M79642 Pain in left hand: Secondary | ICD-10-CM | POA: Insufficient documentation

## 2018-05-12 DIAGNOSIS — IMO0002 Reserved for concepts with insufficient information to code with codable children: Secondary | ICD-10-CM

## 2018-05-12 DIAGNOSIS — M109 Gout, unspecified: Secondary | ICD-10-CM

## 2018-05-12 DIAGNOSIS — M1A00X0 Idiopathic chronic gout, unspecified site, without tophus (tophi): Secondary | ICD-10-CM

## 2018-05-12 HISTORY — DX: Gout, unspecified: M10.9

## 2018-05-12 LAB — POCT GLYCOSYLATED HEMOGLOBIN (HGB A1C): Hemoglobin A1C: 7.6 % — AB (ref 4.0–5.6)

## 2018-05-12 MED ORDER — COLCHICINE 0.6 MG PO TABS: 0.6000 mg | ORAL_TABLET | Freq: Every day | ORAL | 1 refills | Status: DC | PRN

## 2018-05-12 MED ORDER — EMPAGLIFLOZIN 25 MG PO TABS: 25.0000 mg | ORAL_TABLET | Freq: Every day | ORAL | 6 refills | Status: DC

## 2018-05-12 NOTE — Patient Instructions (Addendum)
A1c today I think you have wrist sprain - treat with aleve 2 tablets in the morning, and aleve PM at night time for the next week. Work on gentle stretching, range of motion to wrist, avoid repetitive movements of hand.  For possible gout contribution - try colchicine 1 tablet daily as needed for gout pain.  If not improving with this, let me know for referral to hand doctor.

## 2018-05-12 NOTE — Assessment & Plan Note (Signed)
Tolerating metformin and jardiance - update A1c, consider titrating SLGT2

## 2018-05-12 NOTE — Assessment & Plan Note (Addendum)
Endorses h/o this. Will Rx colchicine PRN gout attack.

## 2018-05-12 NOTE — Assessment & Plan Note (Signed)
Anticipate wrist sprain - rec scheduled aleve BID x 1 wk, continue ben gay cream, discussed gentle stretching, update if no better to consider hand surgery eval. Pt agrees with plan.

## 2018-08-01 ENCOUNTER — Encounter: Payer: Self-pay | Admitting: Physical Therapy

## 2018-08-01 NOTE — Therapy (Signed)
[facility] [address] [place], [place], [address] Phone: [phone]   Fax:  [phone]  Patient Details  Name: Thomas Howard MRN: [id number] Date of Birth: December 01, 1960 Referring Provider:  Ria Bush, MD  Encounter Date: 08/01/2018  PHYSICAL THERAPY DISCHARGE SUMMARY  Visits from Start of Care: 15  Current functional level related to goals / functional outcomes: See last PT note on 12/31/2017   Remaining deficits: See last PT note on 12/31/2017   Education / Equipment: HEP  Plan: Patient agrees to discharge.  Patient goals were not met. Patient is being discharged due to not returning since the last visit.  ?????         Nelline Lio PT, DPT 08/01/2018, 3:04 PM  [facility] [address] [place], [place], [address] Phone: [phone]   Fax:  [phone]

## 2018-08-03 NOTE — Therapy (Signed)
[facility] [address] [place], [place], [address] Phone: [phone]   Fax:  [phone]  Patient Details  Name: Thomas Howard MRN: [id number] Date of Birth: 02-05-1961 Referring Provider:  Orlie Dakin, MD  Encounter Date: 08/03/2018  SPEECH THERAPY DISCHARGE SUMMARY  Visits from Start of Care: 11  Current functional level related to goals / functional outcomes: Pt progress from his last scheduled ST appointment was as follows:  Pt had difficulty with deductive reasoning puzzle - stated wife couldn't complete either. SLP provided mod A with deduction puzzle #1. SLP worked with pt on deductive puzzle #2 and provided min-mod A - pt with decr'd alternating attention in filling in squares: would have two things to fill in and would iether fill in only one thing and req'd cues for the next or would fill in second think in the wrong box without awareness.  Goals/progress were as follows SLP Short Term Goals - 12/21/17 0936              SLP SHORT TERM GOAL #1    Title  pt will undergo cognitive linguistic testing in the first 3 sessions if necessary     Status  Achieved          SLP SHORT TERM GOAL #2    Title  pt will demo HEP for dysarthria (oral strength, and compensations) with occasional min A x4 sessions     Status  Partially Met          SLP SHORT TERM GOAL #3    Title  pt will use dysarthria compensations in 8 minutes simple conversation to allow for 100% overall intelligibility x2 sessions     Status  Partially Met          SLP SHORT TERM GOAL #4    Title  pt will use dysarthria compensations for 18/20 sentence responses x2 sessions     Status  Achieved          SLP SHORT TERM GOAL #5    Title  pt will use WNL loudness in 8 minutes simple conversation x3 sessions     Status  Partially Met          SLP SHORT TERM GOAL #6    Title  Pt will alternate attention between 2 simple cognitive lingusitic tasks  with 90% on each     Status  Not Met              SLP Long Term Goals - 12/31/17 1421              SLP LONG TERM GOAL #1    Title  pt will demo dysarthria HEP withrare min A x3 visits     Time  4   all pertinent LTGs renewed, week of 01-04-28    Period  Weeks     Status  Not Met   and ongoing         SLP LONG TERM GOAL #2    Title  pt will demo 100% overall intelligibliity in 10 minutes simple to mod complex conversation with modified inedpendence x3 sessions     Time  4     Period  Weeks     Status  Partially Met   and ongoing         SLP LONG TERM GOAL #3    Title  pt will use average 70dB volume in 10 minutes simple to mod complex converastion x2 sessions  Status  Deferred   pt volume is baseline         SLP LONG TERM GOAL #4    Title  Pt will altenrate attention between 2 moderately complex cognitive linguistic tasks with occasional min A and 85% on each     Time  4     Status  Not Met   and ongoing         SLP LONG TERM GOAL #5    Title  Pt will correct errors 3/5 on cognitive linguistic tasks with rare min A over 2 sessions     Time  4     Period  Weeks     Status  Not Met   and ongoing             Plan - 12/31/17 1419     Clinical Impression Statement  Pt reports increasing the frequency of HEP for dysarthria, and his speech is slowlyimproving. He still demo's cognitive communcation deficits in the areas of attention and awareness, at minimum. Continue skilled ST to maximize intellgilbity and cognition for indepedence and safety   Cognition    Skilled Treatment  Pt had difficulty with deductive reasoning puzzle - stated wife oculdn't copmlete either. SLP provided mod A with deduction puzzle #1. SLP worked with pt on deductive puzzle #2 and provided min-mod A - pt with decr'd alternating attention in filling in squares: would have two things to fill in and would iether fill in only one thing and req'd cues for hte next or would fill in second think in the  wrong box without awareness.      Remaining deficits:  At last visit in August 2019, pt had difficulty with attention skills, in alternating and divided areas as well as awareness.    Education / Equipment: Need to double check his work with semi-detailed and detailed tasks.   Plan: Patient agrees to discharge.  Patient goals were partially met. Patient is being discharged due to not returning since the last visit.  ?????       [place] ,MS, CCC-SLP  08/03/2018, 2:13 PM  [facility] [address] [place], [place], [address] Phone: [phone]   Fax:  [phone]

## 2018-08-15 ENCOUNTER — Telehealth: Payer: Self-pay | Admitting: Family Medicine

## 2018-08-15 NOTE — Telephone Encounter (Signed)
Pt put in following request via MyChart. Is this okay as virtual if pt has capability?  Appointment Request From: Thomas Howard    With Provider: Eustaquio Boyden, MD [facility]]    Preferred Date Range: 08/15/2018 - 09/05/2018    Preferred Times: Any time    Reason for visit: Request an Appointment    Comments:  Stroke and blood pressure checkup

## 2018-08-15 NOTE — Telephone Encounter (Signed)
I scheduled pt for a virtual office visit. Pt states he does have the capability to monitor bp at home.

## 2018-08-15 NOTE — Telephone Encounter (Signed)
Yes plz schedule virtual visit if video capability.  Would see if he has bp monitoring capability at home as well.

## 2018-08-16 ENCOUNTER — Encounter: Payer: Self-pay | Admitting: Family Medicine

## 2018-08-16 ENCOUNTER — Other Ambulatory Visit: Payer: Self-pay

## 2018-08-16 ENCOUNTER — Telehealth: Payer: Self-pay | Admitting: Family Medicine

## 2018-08-16 ENCOUNTER — Ambulatory Visit (INDEPENDENT_AMBULATORY_CARE_PROVIDER_SITE_OTHER): Payer: Self-pay | Admitting: Family Medicine

## 2018-08-16 VITALS — BP 148/90 | HR 80 | Temp 97.9°F | Ht 68.0 in | Wt 230.0 lb

## 2018-08-16 DIAGNOSIS — I6381 Other cerebral infarction due to occlusion or stenosis of small artery: Secondary | ICD-10-CM

## 2018-08-16 DIAGNOSIS — E1149 Type 2 diabetes mellitus with other diabetic neurological complication: Secondary | ICD-10-CM

## 2018-08-16 DIAGNOSIS — I10 Essential (primary) hypertension: Secondary | ICD-10-CM

## 2018-08-16 DIAGNOSIS — IMO0002 Reserved for concepts with insufficient information to code with codable children: Secondary | ICD-10-CM

## 2018-08-16 DIAGNOSIS — E1165 Type 2 diabetes mellitus with hyperglycemia: Secondary | ICD-10-CM

## 2018-08-16 MED ORDER — AMLODIPINE BESYLATE 10 MG PO TABS: 10.0000 mg | ORAL_TABLET | Freq: Every day | ORAL | 1 refills | Status: DC

## 2018-08-16 MED ORDER — EMPAGLIFLOZIN 25 MG PO TABS: 25.0000 mg | ORAL_TABLET | Freq: Every day | ORAL | 6 refills | Status: DC

## 2018-08-16 NOTE — Progress Notes (Signed)
Virtual visit completed through Doxy[employer]Me. udio did not work for patient so we completed visit with video through doxy.me and audio through phone call.  Patient location: home, wife April is also present. Provider location: [place] at [facility], office  If any vitals were documented below, they were collected by patient at home unless specified below.   BP (!) 148/90 (BP Location: Left Arm, Patient Position: Sitting, Cuff Size: Normal)   Pulse 80   Temp 97.9 F (36.6 C) (Oral)   Ht _0  (1.727 m)   Wt 230 lb (104.3 kg)   BMI 34.97 kg/m    CC: HTN f/u Subjective:    Patient ID: Thomas Howard, male    DOB: 1960-11-19, 58 y.o.   MRN: [id number]  HPI: Thomas Howard is a 58 y.o. 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 Now only on aspirin 58m.   Staying tired and fatigued.   HTN - compliant with amlodipine 176mdaily, hctz 5063m/2 tab daily, and lisinopril 97m4mily. He does check blood pressures at home - staying 140/90s.   DM - on metformin 1000mg18m as well as jardiance 25mg 38my (started last visit). Doesn't like checking sugars at home. Out of jardiance for last 2 months. Denies paresthesias.      Relevant past medical, surgical, family and social history reviewed and updated as indicated. Interim medical history since our last visit reviewed. Allergies and medications reviewed and updated. Outpatient Medications Prior to Visit  Medication Sig Dispense Refill  . aspirin 81 MG EC tablet Take 81 mg by mouth daily.  0  . atorvastatin (LIPITOR) 80 MG tablet Take 1 tablet (80 mg total) by mouth at bedtime. 90 tablet 3  . Blood Glucose Monitoring Suppl (ONE TOUCH ULTRA 2) w/Device KIT Use as directed E11.49 1 each 0  . colchicine 0.6 MG tablet Take 1 tablet (0.6 mg total) by mouth daily as needed (gout flare). 30 tablet 1  . glucose blood (ONE TOUCH ULTRA TEST) test  strip Use as instructed to check sugars BID E11.49 100 each 3  . hydrochlorothiazide (HYDRODIURIL) 50 MG tablet Take 0.5 tablets (25 mg total) by mouth daily.    . Lancets (ONETOUCH ULTRASOFT) lancets Use as instructed to check sugars BID E11.49 100 each 3  . lisinopril (PRINIVIL,ZESTRIL) 40 MG tablet Take 1 tablet (40 mg total) by mouth daily. 90 tablet 3  . metFORMIN (GLUCOPHAGE) 1000 MG tablet Take 1 tablet (1,000 mg total) by mouth 2 (two) times daily with a meal. 60 tablet 11  . ondansetron (ZOFRAN) 4 MG tablet Take 1 tablet (4 mg total) by mouth every 8 (eight) hours as needed for nausea or vomiting. 20 tablet 0  . potassium chloride (K-DUR) 10 MEQ tablet Take 1 tablet (10 mEq total) by mouth daily. 90 tablet 3  . amLODipine (NORVASC) 10 MG tablet TAKE 1 TABLET BY MOUTH EVERY DAY 30 tablet 2  . empagliflozin (JARDIANCE) 25 MG TABS tablet Take 25 mg by mouth daily. 30 tablet 6   No facility-administered medications prior to visit.      Per HPI unless specifically indicated in ROS section below Review of Systems Objective:    BP (!) 148/90 (BP Location: Left Arm, Patient Position: Sitting, Cuff Size: Normal)   Pulse 80   Temp 97.9 F (36.6 C) (Oral)   Ht _1  (1.727 m)   Wt 230 lb (104.3 kg)  BMI 34.97 kg/m   Wt Readings from Last 3 Encounters:  08/16/18 230 lb (104.3 kg)  05/12/18 242 lb (109.8 kg)  02/22/18 235 lb (106.6 kg)     Physical exam: Gen: alert, NAD, not ill appearing Pulm: speaks in complete sentences without increased work of breathing Psych: normal mood, normal thought content      Results for orders placed or performed in visit on 05/12/18  POCT glycosylated hemoglobin (Hb A1C)  Result Value Ref Range   Hemoglobin A1C 7.6 (A) 4.0 - 5.6 %   HbA1c POC (<> result, manual entry)     HbA1c, POC (prediabetic range)     HbA1c, POC (controlled diabetic range)     Assessment & Plan:   Problem List Items Addressed This Visit    Uncontrolled type 2 diabetes  mellitus with neurologic complication, without long-term current use of insulin ([place])    Anticipate deterioration in control as he's been off jardiance for last 1+ month - states pharmacy hasn't been filling this.  Will refill, advised to let us know if ongoing trouble,  Will ask him to come in for curbside blood draw.  Pt/partner agree with plan.       Relevant Medications   empagliflozin (JARDIANCE) 25 MG TABS tablet   Other Relevant Orders   Hemoglobin A1c   Lacunar stroke of right subthalamic region [facility])   Relevant Orders   Lipid panel   HTN (hypertension) - Primary    Chronic, above goal. However he has been off amlodipine for last 1+ month. Refilled today. Reassess at 57moCPE.       Relevant Medications   amLODipine (NORVASC) 10 MG tablet   Other Relevant Orders   Basic metabolic panel       Meds ordered this encounter  Medications  . amLODipine (NORVASC) 10 MG tablet    Sig: Take 1 tablet (10 mg total) by mouth daily.    Dispense:  90 tablet    Refill:  1  . empagliflozin (JARDIANCE) 25 MG TABS tablet    Sig: Take 25 mg by mouth daily.    Dispense:  30 tablet    Refill:  6   Orders Placed This Encounter  Procedures  . Basic metabolic panel    Standing Status:   Future    Standing Expiration Date:   08/16/2019  . Hemoglobin A1c    Standing Status:   Future    Standing Expiration Date:   08/16/2019  . Lipid panel    Standing Status:   Future    Standing Expiration Date:   08/16/2019    Follow up plan: Return in about 3 months (around 11/15/2018) for annual exam, prior fasting for blood work.  JRia Bush MD

## 2018-08-16 NOTE — Telephone Encounter (Signed)
plz schedule pt for curbside lab visit this or next week, and 3 mo CPE.

## 2018-08-16 NOTE — Assessment & Plan Note (Signed)
Anticipate deterioration in control as he's been off jardiance for last 1+ month - states pharmacy hasn't been filling this.  Will refill, advised to let us know if ongoing trouble,  Will ask him to come in for curbside blood draw.  Pt/partner agree with plan.

## 2018-08-16 NOTE — Assessment & Plan Note (Addendum)
Chronic, above goal. However he has been off amlodipine for last 1+ month. Refilled today. Reassess at 86mo CPE.

## 2018-08-17 NOTE — Telephone Encounter (Signed)
Noted  

## 2018-08-17 NOTE — Telephone Encounter (Signed)
Lab appt 4/9 and cpe 7/20, pt is aware

## 2018-08-17 NOTE — Telephone Encounter (Signed)
Left message asking pt to call office  °

## 2018-08-18 ENCOUNTER — Other Ambulatory Visit (INDEPENDENT_AMBULATORY_CARE_PROVIDER_SITE_OTHER): Payer: Self-pay

## 2018-08-18 ENCOUNTER — Other Ambulatory Visit: Payer: Self-pay

## 2018-08-18 DIAGNOSIS — E1165 Type 2 diabetes mellitus with hyperglycemia: Secondary | ICD-10-CM

## 2018-08-18 DIAGNOSIS — IMO0002 Reserved for concepts with insufficient information to code with codable children: Secondary | ICD-10-CM

## 2018-08-18 DIAGNOSIS — I6381 Other cerebral infarction due to occlusion or stenosis of small artery: Secondary | ICD-10-CM

## 2018-08-18 DIAGNOSIS — I10 Essential (primary) hypertension: Secondary | ICD-10-CM

## 2018-08-18 DIAGNOSIS — E1149 Type 2 diabetes mellitus with other diabetic neurological complication: Secondary | ICD-10-CM

## 2018-08-18 LAB — BASIC METABOLIC PANEL
BUN: 20 mg/dL (ref 6–23)
CO2: 31 mEq/L (ref 19–32)
Calcium: 9.7 mg/dL (ref 8.4–10.5)
Chloride: 99 mEq/L (ref 96–112)
Creatinine, Ser: 1.11 mg/dL (ref 0.40–1.50)
GFR: 83.03 mL/min (ref >60.00)
Glucose, Bld: 144 mg/dL — ABNORMAL HIGH (ref 70–99)
Potassium: 3.5 mEq/L (ref 3.5–5.1)
Sodium: 138 mEq/L (ref 135–145)

## 2018-08-18 LAB — LIPID PANEL
Cholesterol: 185 mg/dL (ref 0–200)
HDL: 30.3 mg/dL — ABNORMAL LOW (ref >39.00)
LDL Cholesterol: 130 mg/dL — ABNORMAL HIGH (ref 0–99)
NonHDL: 155.08
Total CHOL/HDL Ratio: 6
Triglycerides: 123 mg/dL (ref 0.0–149.0)
VLDL: 24.6 mg/dL (ref 0.0–40.0)

## 2018-08-18 LAB — HEMOGLOBIN A1C: Hgb A1c MFr Bld: 7.8 % — ABNORMAL HIGH (ref 4.6–6.5)

## 2018-11-11 ENCOUNTER — Encounter: Payer: Self-pay | Admitting: Family Medicine

## 2018-11-11 DIAGNOSIS — I69319 Unspecified symptoms and signs involving cognitive functions following cerebral infarction: Secondary | ICD-10-CM | POA: Insufficient documentation

## 2018-11-12 ENCOUNTER — Other Ambulatory Visit: Payer: Self-pay | Admitting: Family Medicine

## 2018-11-28 ENCOUNTER — Other Ambulatory Visit: Payer: Self-pay

## 2018-11-28 ENCOUNTER — Ambulatory Visit (INDEPENDENT_AMBULATORY_CARE_PROVIDER_SITE_OTHER): Payer: Self-pay | Admitting: Family Medicine

## 2018-11-28 ENCOUNTER — Encounter: Payer: Self-pay | Admitting: Family Medicine

## 2018-11-28 VITALS — BP 122/74 | HR 90 | Temp 97.3°F | Ht 68.0 in | Wt 233.7 lb

## 2018-11-28 DIAGNOSIS — F321 Major depressive disorder, single episode, moderate: Secondary | ICD-10-CM

## 2018-11-28 DIAGNOSIS — I10 Essential (primary) hypertension: Secondary | ICD-10-CM

## 2018-11-28 DIAGNOSIS — I6612 Occlusion and stenosis of left anterior cerebral artery: Secondary | ICD-10-CM

## 2018-11-28 DIAGNOSIS — E1149 Type 2 diabetes mellitus with other diabetic neurological complication: Secondary | ICD-10-CM

## 2018-11-28 DIAGNOSIS — I6930 Unspecified sequelae of cerebral infarction: Secondary | ICD-10-CM

## 2018-11-28 DIAGNOSIS — Z125 Encounter for screening for malignant neoplasm of prostate: Secondary | ICD-10-CM

## 2018-11-28 DIAGNOSIS — I69354 Hemiplegia and hemiparesis following cerebral infarction affecting left non-dominant side: Secondary | ICD-10-CM

## 2018-11-28 DIAGNOSIS — I6522 Occlusion and stenosis of left carotid artery: Secondary | ICD-10-CM

## 2018-11-28 DIAGNOSIS — Z1211 Encounter for screening for malignant neoplasm of colon: Secondary | ICD-10-CM

## 2018-11-28 DIAGNOSIS — Z0000 Encounter for general adult medical examination without abnormal findings: Secondary | ICD-10-CM

## 2018-11-28 DIAGNOSIS — I69319 Unspecified symptoms and signs involving cognitive functions following cerebral infarction: Secondary | ICD-10-CM

## 2018-11-28 DIAGNOSIS — F17200 Nicotine dependence, unspecified, uncomplicated: Secondary | ICD-10-CM

## 2018-11-28 DIAGNOSIS — IMO0002 Reserved for concepts with insufficient information to code with codable children: Secondary | ICD-10-CM

## 2018-11-28 DIAGNOSIS — E1165 Type 2 diabetes mellitus with hyperglycemia: Secondary | ICD-10-CM

## 2018-11-28 MED ORDER — AMLODIPINE-ATORVASTATIN 10-80 MG PO TABS: 1.0000 | ORAL_TABLET | Freq: Every day | ORAL | 3 refills | Status: DC

## 2018-11-28 MED ORDER — CITALOPRAM HYDROBROMIDE 10 MG PO TABS: 10.0000 mg | ORAL_TABLET | Freq: Every day | ORAL | 6 refills | Status: DC

## 2018-11-28 NOTE — Assessment & Plan Note (Signed)
Chronic, stable. Continue current regimen. Asks about simplifying regimen - will price out combining atorvastatin/amlodipine.

## 2018-11-28 NOTE — Progress Notes (Signed)
This visit was conducted in person.  BP 122/74 (BP Location: Right Arm, Patient Position: Sitting, Cuff Size: Normal)   Pulse 90   Temp (!) 97.3 F (36.3 C) (Temporal)   Ht _0  (1.727 m)   Wt 233 lb 11.2 oz (106 kg)   SpO2 95%   BMI 35.53 kg/m    CC: CPE - self pay Subjective:    Patient ID: Thomas Howard, male    DOB: 1960/11/23, 58 y.o.   MRN: [id number]  HPI: Thomas Howard is a 58 y.o. male presenting on 11/28/2018 for Annual Exam (No new concerns)   Has filed for disability. Wife drove him today.   Lacunar CVA with residual neurological deficits (L hemiparesis) occurred 09/2017. Fully retired 05/11/2018. On aspirin 26m and plavix daily.   DM - not checking sugars. Takes jardiance and metformin.  HTN - bp well controlled.   2 falls this year. Recent bad fall at home - hit L elbow and R knee. Tripped on stool. Ongoing dizziness.   Ongoing depressed mood since stroke - denies SI/HI.   Preventative:  Colon cancer screening - provided with iFOB today.  Prostate cancer screening - discussed, would like to screen. Flu shot - yearly Pneumovax 01/2018 Tdap 06/2013 Seat belt use discussed Sunscreen use discussed. No changing moles on skin.  Smoking - cutting down - trying to wait 2 hrs between cigarettes.  Alcohol - rare Dentist - yearly Eye exam - endorses seen 07/2018 Bowel - no constipation Bladder - no incontinence  Caffeine: sodas - 6 pack/day (pepsi, mt dew, ginger ale)  Lives with GF, 1 cat. Divorced  Occupation: [employer], maintenance ([employer]) Activity: [employer]  Diet: more water, decreasing sodas, some fruits/vegetables     Relevant past medical, surgical, family and social history reviewed and updated as indicated. Interim medical history since our last visit reviewed. Allergies and medications reviewed and updated. Outpatient Medications Prior to Visit  Medication Sig Dispense Refill  . amLODipine (NORVASC) 10 MG tablet Take 1 tablet (10 mg  total) by mouth daily. 90 tablet 1  . aspirin 81 MG EC tablet Take 81 mg by mouth daily.  0  . atorvastatin (LIPITOR) 80 MG tablet TAKE 1 TABLET BY MOUTH EVERYDAY AT BEDTIME 30 tablet 0  . Blood Glucose Monitoring Suppl (ONE TOUCH ULTRA 2) w/Device KIT Use as directed E11.49 1 each 0  . clopidogrel (PLAVIX) 75 MG tablet TAKE 1 TABLET BY MOUTH EVERY DAY 30 tablet 0  . colchicine 0.6 MG tablet Take 1 tablet (0.6 mg total) by mouth daily as needed (gout flare). 30 tablet 1  . empagliflozin (JARDIANCE) 25 MG TABS tablet Take 25 mg by mouth daily. 30 tablet 6  . glucose blood (ONE TOUCH ULTRA TEST) test strip Use as instructed to check sugars BID E11.49 100 each 3  . hydrochlorothiazide (HYDRODIURIL) 50 MG tablet TAKE 1 TABLET BY MOUTH EVERY DAY 30 tablet 0  . Lancets (ONETOUCH ULTRASOFT) lancets Use as instructed to check sugars BID E11.49 100 each 3  . lisinopril (ZESTRIL) 40 MG tablet TAKE 1 TABLET BY MOUTH EVERY DAY 30 tablet 0  . metFORMIN (GLUCOPHAGE) 1000 MG tablet Take 1 tablet (1,000 mg total) by mouth 2 (two) times daily with a meal. 60 tablet 11  . potassium chloride (K-DUR) 10 MEQ tablet Take 1 tablet (10 mEq total) by mouth daily. 90 tablet 3  . ondansetron (ZOFRAN) 4 MG tablet Take 1 tablet (4 mg total) by  mouth every 8 (eight) hours as needed for nausea or vomiting. 20 tablet 0   No facility-administered medications prior to visit.      Per HPI unless specifically indicated in ROS section below Review of Systems  Constitutional: Positive for appetite change (decreased). Negative for activity change, chills, fatigue, fever and unexpected weight change.  HENT: Negative for hearing loss.   Eyes: Negative for visual disturbance.  Respiratory: Negative for cough, chest tightness, shortness of breath and wheezing.   Cardiovascular: Negative for chest pain, palpitations and leg swelling.  Gastrointestinal: Negative for abdominal distention, abdominal pain, blood in stool, constipation,  diarrhea, nausea and vomiting.  Genitourinary: Negative for difficulty urinating and hematuria.  Musculoskeletal: Negative for arthralgias, myalgias and neck pain.  Skin: Negative for rash.  Neurological: Negative for dizziness, seizures, syncope and headaches.  Hematological: Negative for adenopathy. Does not bruise/bleed easily.  Psychiatric/Behavioral: Positive for dysphoric mood (>). The patient is nervous/anxious.    Objective:    BP 122/74 (BP Location: Right Arm, Patient Position: Sitting, Cuff Size: Normal)   Pulse 90   Temp (!) 97.3 F (36.3 C) (Temporal)   Ht _0  (1.727 m)   Wt 233 lb 11.2 oz (106 kg)   SpO2 95%   BMI 35.53 kg/m   Wt Readings from Last 3 Encounters:  11/28/18 233 lb 11.2 oz (106 kg)  08/16/18 230 lb (104.3 kg)  05/12/18 242 lb (109.8 kg)    Physical Exam Vitals signs and nursing note reviewed.  Constitutional:      General: He is not in acute distress.    Appearance: Normal appearance. He is well-developed. He is not ill-appearing.  HENT:     Head: Normocephalic and atraumatic.     Right Ear: Hearing, tympanic membrane, ear canal and external ear normal.     Left Ear: Hearing, tympanic membrane, ear canal and external ear normal.     Nose: Nose normal.     Mouth/Throat:     Mouth: Mucous membranes are moist.     Pharynx: Uvula midline. No oropharyngeal exudate or posterior oropharyngeal erythema.  Eyes:     General: No scleral icterus.    Extraocular Movements: Extraocular movements intact.     Conjunctiva/sclera: Conjunctivae normal.     Pupils: Pupils are equal, round, and reactive to light.  Neck:     Musculoskeletal: Normal range of motion and neck supple.  Cardiovascular:     Rate and Rhythm: Normal rate and regular rhythm.     Pulses: Normal pulses.          Radial pulses are 2+ on the right side and 2+ on the left side.     Heart sounds: Normal heart sounds. No murmur.  Pulmonary:     Effort: Pulmonary effort is normal. No  respiratory distress.     Breath sounds: Normal breath sounds. No wheezing, rhonchi or rales.  Abdominal:     General: Bowel sounds are normal. There is no distension.     Palpations: Abdomen is soft. There is no mass.     Tenderness: There is no abdominal tenderness. There is no guarding or rebound.     Hernia: No hernia is present.  Musculoskeletal: Normal range of motion.     Right lower leg: No edema.     Left lower leg: No edema.  Lymphadenopathy:     Cervical: No cervical adenopathy.  Skin:    General: Skin is warm and dry.     Findings: No rash.  Neurological:     General: No focal deficit present.     Mental Status: He is alert and oriented to person, place, and time.     Comments: CN grossly intact, station and gait intact  Psychiatric:        Mood and Affect: Mood normal.        Behavior: Behavior normal.        Thought Content: Thought content normal.        Judgment: Judgment normal.       Results for orders placed or performed in visit on 08/18/18  Lipid panel  Result Value Ref Range   Cholesterol 185 0 - 200 mg/dL   Triglycerides 123.0 0.0 - 149.0 mg/dL   HDL 30.30 (L) >39.00 mg/dL   VLDL 24.6 0.0 - 40.0 mg/dL   LDL Cholesterol 130 (H) 0 - 99 mg/dL   Total CHOL/HDL Ratio 6    NonHDL 155.08   Hemoglobin A1c  Result Value Ref Range   Hgb A1c MFr Bld 7.8 (H) 4.6 - 6.5 %  Basic metabolic panel  Result Value Ref Range   Sodium 138 135 - 145 mEq/L   Potassium 3.5 3.5 - 5.1 mEq/L   Chloride 99 96 - 112 mEq/L   CO2 31 19 - 32 mEq/L   Glucose, Bld 144 (H) 70 - 99 mg/dL   BUN 20 6 - 23 mg/dL   Creatinine, Ser 1.11 0.40 - 1.50 mg/dL   Calcium 9.7 8.4 - 10.5 mg/dL   GFR 83.03 >60.00 mL/min   Lab Results  Component Value Date   PSA 0.89 06/20/2013    Depression screen [facility] 2/9 11/28/2018 01/17/2018  Decreased Interest 0 0  Down, Depressed, Hopeless 2 0  PHQ - 2 Score 2 0  Altered sleeping 3 -  Tired, decreased energy 3 -  Change in appetite 1 -  Feeling bad  or failure about yourself  3 -  Trouble concentrating 3 -  Moving slowly or fidgety/restless 0 -  Suicidal thoughts 0 -  PHQ-9 Score 15 -    GAD 7 : Generalized Anxiety Score 11/28/2018  Nervous, Anxious, on Edge 2  Control/stop worrying 0  Worry too much - different things 0  Trouble relaxing 0  Restless 0  Easily annoyed or irritable 0  Afraid - awful might happen 3  Total GAD 7 Score 5    Assessment & Plan:   Problem List Items Addressed This Visit    Uncontrolled type 2 diabetes mellitus with neurologic complication, without long-term current use of insulin ([place])    Update labs. Trouble affording jardiance given no insurance. I asked him to price out farxiga or invokana at pharmacy. If unaffordable, may need to change to sulfonylurea or thiazolidinedione for cost.       Relevant Orders   Hemoglobin W1X   Basic metabolic panel   Smoker    Continue to encourage full cessation.       Severe obesity (BMI 35.0-39.9) with comorbidity ([place])   Occlusion and stenosis of left carotid artery    Would want updated imaging, will await insurance.       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       Relevant Medications   amLODipine-atorvastatin (CADUET) 10-80 MG tablet   MDD (major depressive disorder), single episode, moderate (HCC)    Persistent since stroke. Will start celexa 21m  daily. Denies SI/HI.       Relevant Medications   citalopram (CELEXA) 10 MG tablet   HTN (hypertension)    Chronic, stable. Continue current regimen. Asks about simplifying regimen - will price out combining atorvastatin/amlodipine.       Relevant Medications   amLODipine-atorvastatin (CADUET) 10-80 MG tablet   History of stroke with residual deficit    Working on disability application      Hemiplegia and hemiparesis following cerebral infarction affecting left  non-dominant side [facility])   Health care maintenance - Primary    Preventative protocols reviewed and updated unless pt declined. Discussed healthy diet and lifestyle.       Cognitive deficit as late effect of cerebrovascular accident (CVA)    Other Visit Diagnoses    Special screening for malignant neoplasms, colon       Relevant Orders   Fecal occult blood, imunochemical   Special screening for malignant neoplasm of prostate       Relevant Orders   PSA       Meds ordered this encounter  Medications  . amLODipine-atorvastatin (CADUET) 10-80 MG tablet    Sig: Take 1 tablet by mouth daily.    Dispense:  30 tablet    Refill:  3    Price out in place of individual components  . citalopram (CELEXA) 10 MG tablet    Sig: Take 1 tablet (10 mg total) by mouth daily.    Dispense:  30 tablet    Refill:  6   Orders Placed This Encounter  Procedures  . Fecal occult blood, imunochemical    Standing Status:   Future    Standing Expiration Date:   11/28/2019  . PSA  . Hemoglobin A1c  . Basic metabolic panel    Patient instructions: Price out caduet (amlodipine 60m/atorvastatin 891m in place of individual components  Check with pharmacist if any cheaper alternatives in the same family (invokana, farxiga).  Pass by lab to pick up stool kit.  Start celexa 1046maily for the mood.   Follow up plan: Return in about 3 months (around 02/28/2019) for follow up visit.  JavRia BushD

## 2018-11-28 NOTE — Assessment & Plan Note (Signed)
Preventative protocols reviewed and updated unless pt declined. Discussed healthy diet and lifestyle.  

## 2018-11-28 NOTE — Patient Instructions (Addendum)
Price out caduet (amlodipine 13m/atorvastatin 816m in place of individual components  Check with pharmacist if any cheaper alternatives in the same family (invokana, farxiga).  Pass by lab to pick up stool kit.  Start celexa 1076maily for the mood.   Health Maintenance, Male Adopting a healthy lifestyle and getting preventive care are important in promoting health and wellness. Ask your health care provider about:  The right schedule for you to have regular tests and exams.  Things you can do on your own to prevent diseases and keep yourself healthy. What should I know about diet, weight, and exercise? Eat a healthy diet   Eat a diet that includes plenty of vegetables, fruits, low-fat dairy products, and lean protein.  Do not eat a lot of foods that are high in solid fats, added sugars, or sodium. Maintain a healthy weight Body mass index (BMI) is a measurement that can be used to identify possible weight problems. It estimates body fat based on height and weight. Your health care provider can help determine your BMI and help you achieve or maintain a healthy weight. Get regular exercise Get regular exercise. This is one of the most important things you can do for your health. Most adults should:  Exercise for at least 150 minutes each week. The exercise should increase your heart rate and make you sweat (moderate-intensity exercise).  Do strengthening exercises at least twice a week. This is in addition to the moderate-intensity exercise.  Spend less time sitting. Even light physical activity can be beneficial. Watch cholesterol and blood lipids Have your blood tested for lipids and cholesterol at 20 31ars of age, then have this test every 5 years. You may need to have your cholesterol levels checked more often if:  Your lipid or cholesterol levels are high.  You are older than 40 58ars of age.  You are at high risk for heart disease. What should I know about cancer  screening? Many types of cancers can be detected early and may often be prevented. Depending on your health history and family history, you may need to have cancer screening at various ages. This may include screening for:  Colorectal cancer.  Prostate cancer.  Skin cancer.  Lung cancer. What should I know about heart disease, diabetes, and high blood pressure? Blood pressure and heart disease  High blood pressure causes heart disease and increases the risk of stroke. This is more likely to develop in people who have high blood pressure readings, are of African descent, or are overweight.  Talk with your health care provider about your target blood pressure readings.  Have your blood pressure checked: ? Every 3-5 years if you are 18-64 79ars of age. ? Every year if you are 40 26ars old or older.  If you are between the ages of 65 79d 75 64d are a current or former smoker, ask your health care provider if you should have a one-time screening for abdominal aortic aneurysm (AAA). Diabetes Have regular diabetes screenings. This checks your fasting blood sugar level. Have the screening done:  Once every three years after age 62 59 you are at a normal weight and have a low risk for diabetes.  More often and at a younger age if you are overweight or have a high risk for diabetes. What should I know about preventing infection? Hepatitis B If you have a higher risk for hepatitis B, you should be screened for this virus. Talk with your health care provider to find  out if you are at risk for hepatitis B infection. Hepatitis C Blood testing is recommended for:  Everyone born from [birth year] through 1963.  Anyone with known risk factors for hepatitis C. Sexually transmitted infections (STIs)  You should be screened each year for STIs, including gonorrhea and chlamydia, if: ? You are sexually active and are younger than 56 years of age. ? You are older than 56 years of age and your health care  provider tells you that you are at risk for this type of infection. ? Your sexual activity has changed since you were last screened, and you are at increased risk for chlamydia or gonorrhea. Ask your health care provider if you are at risk.  Ask your health care provider about whether you are at high risk for HIV. Your health care provider may recommend a prescription medicine to help prevent HIV infection. If you choose to take medicine to prevent HIV, you should first get tested for HIV. You should then be tested every 3 months for as long as you are taking the medicine. Follow these instructions at home: Lifestyle  Do not use any products that contain nicotine or tobacco, such as cigarettes, e-cigarettes, and chewing tobacco. If you need help quitting, ask your health care provider.  Do not use street drugs.  Do not share needles.  Ask your health care provider for help if you need support or information about quitting drugs. Alcohol use  Do not drink alcohol if your health care provider tells you not to drink.  If you drink alcohol: ? Limit how much you have to 0-2 drinks a day. ? Be aware of how much alcohol is in your drink. In the [place] one drink equals one 12 oz bottle of beer (355 mL), one 5 oz glass of wine (148 mL), or one 1 oz glass of hard liquor (44 mL). General instructions  Schedule regular health, dental, and eye exams.  Stay current with your vaccines.  Tell your health care provider if: ? You often feel depressed. ? You have ever been abused or do not feel safe at home. Summary  Adopting a healthy lifestyle and getting preventive care are important in promoting health and wellness.  Follow your health care provider's instructions about healthy diet, exercising, and getting tested or screened for diseases.  Follow your health care provider's instructions on monitoring your cholesterol and blood pressure. This information is not intended to replace advice given  to you by your health care provider. Make sure you discuss any questions you have with your health care provider. Document Released: 10/24/2007 Document Revised: 04/20/2018 Document Reviewed: 04/20/2018 Elsevier Patient Education  2020 [employer].

## 2018-11-28 NOTE — Assessment & Plan Note (Signed)
Persistent since stroke. Will start celexa 10mg  daily. Denies SI/HI.

## 2018-11-28 NOTE — Assessment & Plan Note (Addendum)
Update labs. Trouble affording jardiance given no insurance. I asked him to price out farxiga or invokana at pharmacy. If unaffordable, may need to change to sulfonylurea or thiazolidinedione for cost.

## 2018-11-28 NOTE — Assessment & Plan Note (Signed)
Continue to encourage full cessation 

## 2018-11-28 NOTE — Assessment & Plan Note (Signed)
Continue aspirin ,plavix, statin.  

## 2018-11-28 NOTE — Assessment & Plan Note (Signed)
Would want updated imaging, will await insurance.

## 2018-11-28 NOTE — Assessment & Plan Note (Signed)
Working on Environmental manager

## 2018-11-29 LAB — BASIC METABOLIC PANEL
BUN: 19 mg/dL (ref 6–23)
CO2: 24 mEq/L (ref 19–32)
Calcium: 9.6 mg/dL (ref 8.4–10.5)
Chloride: 99 mEq/L (ref 96–112)
Creatinine, Ser: 1.23 mg/dL (ref 0.40–1.50)
GFR: 73.68 mL/min (ref >60.00)
Glucose, Bld: 103 mg/dL — ABNORMAL HIGH (ref 70–99)
Potassium: 3.6 mEq/L (ref 3.5–5.1)
Sodium: 137 mEq/L (ref 135–145)

## 2018-11-29 LAB — PSA: PSA: 1.04 ng/mL (ref 0.10–4.00)

## 2018-11-29 LAB — HEMOGLOBIN A1C: Hgb A1c MFr Bld: 7.8 % — ABNORMAL HIGH (ref 4.6–6.5)

## 2019-01-24 ENCOUNTER — Other Ambulatory Visit: Payer: Self-pay | Admitting: Family Medicine

## 2019-02-11 ENCOUNTER — Other Ambulatory Visit: Payer: Self-pay | Admitting: Family Medicine

## 2019-03-12 ENCOUNTER — Other Ambulatory Visit: Payer: Self-pay | Admitting: Family Medicine

## 2019-03-15 NOTE — Telephone Encounter (Signed)
Patent needs an appointment for follow up scheduled. Thank you

## 2019-03-16 NOTE — Telephone Encounter (Signed)
Pt is scheduled for 03/29/19 @3pm 

## 2019-03-29 ENCOUNTER — Other Ambulatory Visit: Payer: Self-pay

## 2019-03-29 ENCOUNTER — Encounter: Payer: Self-pay | Admitting: Family Medicine

## 2019-03-29 ENCOUNTER — Ambulatory Visit (INDEPENDENT_AMBULATORY_CARE_PROVIDER_SITE_OTHER): Payer: Self-pay | Admitting: Family Medicine

## 2019-03-29 VITALS — BP 134/68 | HR 95 | Temp 97.8°F | Ht 68.0 in | Wt 238.5 lb

## 2019-03-29 DIAGNOSIS — E1136 Type 2 diabetes mellitus with diabetic cataract: Secondary | ICD-10-CM

## 2019-03-29 DIAGNOSIS — I69354 Hemiplegia and hemiparesis following cerebral infarction affecting left non-dominant side: Secondary | ICD-10-CM

## 2019-03-29 DIAGNOSIS — E1149 Type 2 diabetes mellitus with other diabetic neurological complication: Secondary | ICD-10-CM

## 2019-03-29 DIAGNOSIS — E1159 Type 2 diabetes mellitus with other circulatory complications: Secondary | ICD-10-CM

## 2019-03-29 DIAGNOSIS — E1165 Type 2 diabetes mellitus with hyperglycemia: Secondary | ICD-10-CM

## 2019-03-29 DIAGNOSIS — F321 Major depressive disorder, single episode, moderate: Secondary | ICD-10-CM

## 2019-03-29 DIAGNOSIS — I10 Essential (primary) hypertension: Secondary | ICD-10-CM

## 2019-03-29 DIAGNOSIS — IMO0002 Reserved for concepts with insufficient information to code with codable children: Secondary | ICD-10-CM

## 2019-03-29 DIAGNOSIS — I152 Hypertension secondary to endocrine disorders: Secondary | ICD-10-CM

## 2019-03-29 LAB — POCT GLYCOSYLATED HEMOGLOBIN (HGB A1C): Hemoglobin A1C: 7.7 % — AB (ref 4.0–5.6)

## 2019-03-29 NOTE — Assessment & Plan Note (Signed)
Stable period - continue current regimen.  

## 2019-03-29 NOTE — Progress Notes (Signed)
This visit was conducted in person.  BP 134/68 (BP Location: Left Arm, Patient Position: Sitting, Cuff Size: Large)   Pulse 95   Temp 97.8 F (36.6 C) (Temporal)   Ht 5' 8"  (1.727 m)   Wt 238 lb 8 oz (108.2 kg)   SpO2 97%   BMI 36.26 kg/m    CC: 3 mo f/u visit Subjective:    Patient ID: Thomas Howard, male    DOB: January 18, 1961, 58 y.o.   MRN: [id number]  HPI: Thomas Howard is a 58 y.o. male presenting on 03/29/2019 for Follow-up (Here for 3 mo f/u. )   Recent trips to [facility] (mother passed away last month, planning to return for Thanksgiving). Stressful trips.  Self pay - has filed for disability after lacunar stroke with residual neurological deficits (L hemiparesis) 09/2017. Fully retired 05/11/2018. Continues aspirin and plavix daily. Last visit we started celexa 7m daily. Tolerating this well. He thinks this has helped mood.   DM - does not regularly check sugars. Compliant with antihyperglycemic regimen which includes: jardiance 266mdaily, metformin 100065mid. Jardiance is $564/mo. Denies low sugars or hypoglycemic symptoms. Denies paresthesias. Last diabetic eye exam - overdue ([facility]). Pneumovax: 2019. Prevnar: not due. Glucometer brand: one-touch. DSME: 01/2018 - 1/3 classes, never returned.  Lab Results  Component Value Date   HGBA1C 7.7 (A) 03/29/2019   Diabetic Foot Exam - Simple   Simple Foot Form Diabetic Foot exam was performed with the following findings: Yes 03/29/2019  3:26 PM  Visual Inspection See comments: Yes Sensation Testing See comments: Yes Pulse Check Posterior Tibialis and Dorsalis pulse intact bilaterally: Yes Comments Dry scaly skin throughout bilateral soles Diminished sensation to light touch and monofilament testing on left (side of hemiparesis)    No results found for: MICDerl Barrow   Relevant past medical, surgical, family and social history reviewed and updated as indicated. Interim medical history since our last  visit reviewed. Allergies and medications reviewed and updated. Outpatient Medications Prior to Visit  Medication Sig Dispense Refill  . amLODipine (NORVASC) 10 MG tablet TAKE 1 TABLET BY MOUTH EVERY DAY 90 tablet 2  . amLODipine-atorvastatin (CADUET) 10-80 MG tablet Take 1 tablet by mouth daily. 30 tablet 3  . aspirin 81 MG EC tablet Take 81 mg by mouth daily.  0  . atorvastatin (LIPITOR) 80 MG tablet TAKE 1 TABLET BY MOUTH EVERYDAY AT BEDTIME 90 tablet 1  . Blood Glucose Monitoring Suppl (ONE TOUCH ULTRA 2) w/Device KIT Use as directed E11.49 1 each 0  . citalopram (CELEXA) 10 MG tablet Take 1 tablet (10 mg total) by mouth daily. 30 tablet 6  . clopidogrel (PLAVIX) 75 MG tablet TAKE 1 TABLET BY MOUTH EVERY DAY 90 tablet 1  . colchicine 0.6 MG tablet Take 1 tablet (0.6 mg total) by mouth daily as needed (gout flare). 30 tablet 1  . glucose blood (ONE TOUCH ULTRA TEST) test strip Use as instructed to check sugars BID E11.49 100 each 3  . hydrochlorothiazide (HYDRODIURIL) 50 MG tablet TAKE 1 TABLET BY MOUTH EVERY DAY 90 tablet 1  . Lancets (ONETOUCH ULTRASOFT) lancets Use as instructed to check sugars BID E11.49 100 each 3  . lisinopril (ZESTRIL) 40 MG tablet TAKE 1 TABLET BY MOUTH EVERY DAY 90 tablet 1  . metFORMIN (GLUCOPHAGE) 500 MG tablet Take 1 tablet (500 mg total) by mouth 2 (two) times daily with a meal.    . potassium chloride (K-DUR) 10 MEQ tablet Take  1 tablet (10 mEq total) by mouth daily. 90 tablet 3  . empagliflozin (JARDIANCE) 25 MG TABS tablet Take 25 mg by mouth daily. 30 tablet 6  . metFORMIN (GLUCOPHAGE) 1000 MG tablet Take 1 tablet (1,000 mg total) by mouth 2 (two) times daily with a meal. 60 tablet 11  . metFORMIN (GLUCOPHAGE) 500 MG tablet TAKE 1 TABLET BY MOUTH 2 (TWO) TIMES DAILY WITH A MEAL. FIRST WEEK TAKE ONE TABLET DAILY 180 tablet 3   No facility-administered medications prior to visit.      Per HPI unless specifically indicated in ROS section below Review of  Systems Objective:    BP 134/68 (BP Location: Left Arm, Patient Position: Sitting, Cuff Size: Large)   Pulse 95   Temp 97.8 F (36.6 C) (Temporal)   Ht 5' 8"  (1.727 m)   Wt 238 lb 8 oz (108.2 kg)   SpO2 97%   BMI 36.26 kg/m   Wt Readings from Last 3 Encounters:  03/29/19 238 lb 8 oz (108.2 kg)  11/28/18 233 lb 11.2 oz (106 kg)  08/16/18 230 lb (104.3 kg)    Physical Exam Vitals signs and nursing note reviewed.  Constitutional:      General: He is not in acute distress.    Appearance: Normal appearance. He is well-developed. He is obese. He is not ill-appearing.  HENT:     Head: Normocephalic and atraumatic.  Eyes:     General: No scleral icterus.    Extraocular Movements: Extraocular movements intact.     Conjunctiva/sclera: Conjunctivae normal.     Pupils: Pupils are equal, round, and reactive to light.     Comments: bilat cataracts present  Neck:     Musculoskeletal: Normal range of motion and neck supple.  Cardiovascular:     Rate and Rhythm: Normal rate and regular rhythm.     Heart sounds: Normal heart sounds. No murmur.  Pulmonary:     Effort: Pulmonary effort is normal. No respiratory distress.     Breath sounds: Normal breath sounds. No wheezing or rales.  Musculoskeletal:     Right lower leg: No edema.     Left lower leg: No edema.     Comments: See HPI for foot exam if done  Lymphadenopathy:     Cervical: No cervical adenopathy.  Skin:    General: Skin is warm and dry.     Findings: No rash.  Neurological:     Mental Status: He is alert.       Results for orders placed or performed in visit on 03/29/19  POCT glycosylated hemoglobin (Hb A1C)  Result Value Ref Range   Hemoglobin A1C 7.7 (A) 4.0 - 5.6 %   HbA1c POC (<> result, manual entry)     HbA1c, POC (prediabetic range)     HbA1c, POC (controlled diabetic range)     Assessment & Plan:  I called wife April and discussed below re cost of meds.  Problem List Items Addressed This Visit     Uncontrolled type 2 diabetes mellitus with neurologic complication, without long-term current use of insulin ([place]) - Primary    He's only been taking metformin once daily - increase to twice daily.  Has continued jardiance but this is very expensive and he's self pay.  Discussed more affordable medicines. I asked him to price out weekly GLP1 RA and if unaffordable will start sulfonylurea or thiazolidinedione.       Relevant Medications   metFORMIN (GLUCOPHAGE) 500 MG tablet  Other Relevant Orders   POCT glycosylated hemoglobin (Hb A1C) (Completed)   Ambulatory referral to Podiatry   MDD (major depressive disorder), single episode, moderate ([place])    Some improvement with celexa 29m - declines increased dose.       Hypertension associated with diabetes ([facility]    Stable period - continue current regimen.       Relevant Medications   metFORMIN (GLUCOPHAGE) 500 MG tablet   Hemiplegia and hemiparesis following cerebral infarction affecting left non-dominant side ([facility])    Residual L sided hemiparesis.       Diabetic cataract of both eyes ([facility]    Encouraged eye doctor f/u.       Relevant Medications   metFORMIN (GLUCOPHAGE) 500 MG tablet       No orders of the defined types were placed in this encounter.  Orders Placed This Encounter  Procedures  . Ambulatory referral to Podiatry    Referral Priority:   Routine    Referral Type:   Consultation    Referral Reason:   Specialty Services Required    Requested Specialty:   Podiatry    Number of Visits Requested:   1  . POCT glycosylated hemoglobin (Hb A1C)    Patient Instructions  JVania Reais too expensive! Check with pharmacy on cost for trulicity or ozempic (weekly shots).  If all too expensive, we will start different sugar medicine (likely either actos or glimepiride).  Continue metformin but increase to twice daily with meals.  See lab today for stool kit.  Schedule eye exam as you're due ([facility]).  We will  refer you to podiatry (foot doctor)   Follow up plan: Return in about 3 months (around 06/29/2019) for follow up visit.  JRia Bush MD

## 2019-03-29 NOTE — Assessment & Plan Note (Addendum)
He's only been taking metformin once daily - increase to twice daily.  Has continued jardiance but this is very expensive and he's self pay.  Discussed more affordable medicines. I asked him to price out weekly GLP1 RA and if unaffordable will start sulfonylurea or thiazolidinedione.

## 2019-03-29 NOTE — Assessment & Plan Note (Signed)
Residual L sided hemiparesis.

## 2019-03-29 NOTE — Assessment & Plan Note (Signed)
Encouraged eye doctor f/u.

## 2019-03-29 NOTE — Patient Instructions (Addendum)
Thomas Howard is too expensive! Check with pharmacy on cost for trulicity or ozempic (weekly shots).  If all too expensive, we will start different sugar medicine (likely either actos or glimepiride).  Continue metformin but increase to twice daily with meals.  See lab today for stool kit.  Schedule eye exam as you're due ([facility]).  We will refer you to podiatry (foot doctor)

## 2019-03-29 NOTE — Assessment & Plan Note (Signed)
Some improvement with celexa 10mg  - declines increased dose.

## 2019-04-03 ENCOUNTER — Telehealth: Payer: Self-pay

## 2019-04-03 NOTE — Telephone Encounter (Signed)
Copied from [place] [phone]. Topic: Referral - Status >> Apr 03, 2019  1:88 PM Simone Curia D wrote: 41/66/0630 Left message for April Blackwell friend of patient per [facility] to return my call regarding financial information to complete PAP's. Ambrose Mantle [phone]

## 2019-04-04 ENCOUNTER — Telehealth: Payer: Self-pay

## 2019-04-04 NOTE — Telephone Encounter (Signed)
Copied from [place] [phone]. Topic: Referral - Status >> Apr 04, 2019  7:40 AM Simone Curia D wrote: 81/44/8185 Left message for April Blackwell friend of patient per [facility] to return my call regarding financial information to complete PAP's. Ambrose Mantle [phone]

## 2019-04-11 ENCOUNTER — Telehealth: Payer: Self-pay

## 2019-04-11 NOTE — Telephone Encounter (Signed)
Copied from [place] [phone]. Topic: Referral - Status >> Apr 11, 2019  5:46 PM Simone Curia D wrote: 56/12/1273 Left message for April Blackwell friend of patient per [facility] to return my call regarding financial information to complete PAP's. Ambrose Mantle [phone]

## 2019-04-12 ENCOUNTER — Telehealth: Payer: Self-pay

## 2019-04-12 NOTE — Telephone Encounter (Signed)
Noted. Thanks.

## 2019-04-12 NOTE — Telephone Encounter (Signed)
Copied from [place] [phone]. Topic: Referral - Status >> Apr 12, 2019  7:36 PM Simone Curia D wrote: 68/05/5945 Spoke with April Blackwell friend of patient per [facility] regarding financial information for PAP applications. April will bring the financial documents for Mr. Dupler to sign the applications. Emailed applications to Dr. Danise Mina. Ambrose Mantle [phone]

## 2019-04-17 ENCOUNTER — Ambulatory Visit: Payer: Self-pay | Admitting: Podiatry

## 2019-04-20 ENCOUNTER — Other Ambulatory Visit: Payer: Self-pay

## 2019-04-20 ENCOUNTER — Ambulatory Visit (INDEPENDENT_AMBULATORY_CARE_PROVIDER_SITE_OTHER): Payer: Self-pay | Admitting: Podiatry

## 2019-04-20 ENCOUNTER — Encounter: Payer: Self-pay | Admitting: Podiatry

## 2019-04-20 ENCOUNTER — Ambulatory Visit: Payer: Self-pay | Admitting: Podiatry

## 2019-04-20 DIAGNOSIS — E1149 Type 2 diabetes mellitus with other diabetic neurological complication: Secondary | ICD-10-CM

## 2019-04-20 DIAGNOSIS — D689 Coagulation defect, unspecified: Secondary | ICD-10-CM

## 2019-04-20 DIAGNOSIS — E1165 Type 2 diabetes mellitus with hyperglycemia: Secondary | ICD-10-CM

## 2019-04-20 DIAGNOSIS — B351 Tinea unguium: Secondary | ICD-10-CM | POA: Insufficient documentation

## 2019-04-20 DIAGNOSIS — M79674 Pain in right toe(s): Secondary | ICD-10-CM

## 2019-04-20 DIAGNOSIS — IMO0002 Reserved for concepts with insufficient information to code with codable children: Secondary | ICD-10-CM

## 2019-04-20 DIAGNOSIS — M79675 Pain in left toe(s): Secondary | ICD-10-CM

## 2019-04-20 NOTE — Progress Notes (Addendum)
This patient presents to the office with chief complaint of long thick nails and diabetic feet.  This patient  says there  is  no pain and discomfort in his  feet.  This patient says there are long thick painful nails.  These nails are painful walking and wearing shoes.  Patient has no history of infection or drainage from both feet.  Patient is unable to  self treat his own nails . This patient presents  to the office today for treatment of the  long nails and a foot evaluation due to history of  Diabetes. Patient is taking plavix.  General Appearance  Alert, conversant and in no acute stress.  Vascular  Dorsalis pedis and posterior tibial  pulses are palpable  bilaterally.  Capillary return is within normal limits  bilaterally. Temperature is within normal limits  bilaterally.  Neurologic  Senn-Weinstein monofilament wire test within normal limits  bilaterally. Muscle power within normal limits bilaterally.  Nails Thick disfigured discolored nails with subungual debris  from hallux to fifth toes bilaterally. No evidence of bacterial infection or drainage bilaterally.  Orthopedic  No limitations of motion of motion feet .  No crepitus or effusions noted.  No bony pathology or digital deformities noted.  Skin  Severe peeling dry skin  X 20 years..  No signs of infections or ulcers noted.   Callus sub 5th left foot asymptomatic.  Onychomycosis  Diabetes with no foot complications  IE  Debride nails x 10.  A diabetic foot exam was performed and there is no evidence of any vascular or neurologic pathology.   RTC 3 months.   Gardiner Barefoot DPM

## 2019-04-24 ENCOUNTER — Telehealth: Payer: Self-pay

## 2019-04-24 NOTE — Telephone Encounter (Signed)
Copied from [place] [phone]. Topic: Referral - Status >> Apr 24, 2019  8:25 PM Simone Curia D wrote: 00/37/0488 Spoke with April Blackwell she has the financial documents ready to send with the PAP's when the forms have been completed by the office. Ambrose Mantle [phone]

## 2019-04-26 NOTE — Telephone Encounter (Signed)
Pt came in and signed forms.  Faxed both forms.  Originals given to pt.

## 2019-04-26 NOTE — Telephone Encounter (Signed)
Millie returning call.  States forms need to be faxed once pt signs & dates.

## 2019-04-26 NOTE — Telephone Encounter (Signed)
Spoke with pt's partner, April (on dpr), notifying her Dr. Darnell Level has completed his sections of the forms.  We need pt to come to the office to sign/date his sections.  Copies will be made to scan to pt's chart.  Original will go to pt.   Lvm for Millie asking her to call back.  Need to know if paperwork needs to be faxed or mailed.

## 2019-04-26 NOTE — Telephone Encounter (Addendum)
printed jardiance and ozempic application forms, signed, and placed in Lisa's box.

## 2019-05-08 ENCOUNTER — Telehealth: Payer: Self-pay

## 2019-05-08 NOTE — Telephone Encounter (Addendum)
Lvm asking pt to call back.  Need to notify him his application for the [employer] Pt Assistance Program has been approved from 04/28/2019- 04/27/2020. 

## 2019-05-09 NOTE — Telephone Encounter (Signed)
Lvm asking pt to call back.  Need to notify him his application for the [place] has been approved from 04/28/2019- 04/27/2020.

## 2019-05-10 NOTE — Telephone Encounter (Signed)
Spoke with pt's partner, April (on dpr), informing her of approval for pt assistance program.  Rosezena Sensor understanding and states they did receive mailed notification.

## 2019-05-23 ENCOUNTER — Encounter: Payer: Self-pay | Admitting: Family Medicine

## 2019-05-23 MED ORDER — AMLODIPINE BESYLATE 10 MG PO TABS: 10.0000 mg | ORAL_TABLET | Freq: Every day | ORAL | 1 refills | Status: DC

## 2019-05-23 MED ORDER — POTASSIUM CHLORIDE ER 10 MEQ PO TBCR: 10.0000 meq | EXTENDED_RELEASE_TABLET | Freq: Every day | ORAL | 1 refills | Status: DC

## 2019-05-23 MED ORDER — LISINOPRIL 40 MG PO TABS: 40.0000 mg | ORAL_TABLET | Freq: Every day | ORAL | 1 refills | Status: DC

## 2019-05-23 NOTE — Telephone Encounter (Signed)
Other refills sent in.  Fwd to Dr. Reece Agar to address Plavix.   Clopidogrel Last rx:  03/16/19, #90 Last OV:  03/29/19, f/u Next OV:  None

## 2019-05-24 MED ORDER — CLOPIDOGREL BISULFATE 75 MG PO TABS: 75.0000 mg | ORAL_TABLET | Freq: Every day | ORAL | 1 refills | Status: DC

## 2019-06-02 ENCOUNTER — Other Ambulatory Visit: Payer: Self-pay | Admitting: Family Medicine

## 2019-06-02 MED ORDER — ATORVASTATIN CALCIUM 80 MG PO TABS: ORAL_TABLET | ORAL | 3 refills | Status: DC

## 2019-07-20 ENCOUNTER — Ambulatory Visit: Payer: Self-pay | Admitting: Podiatry

## 2019-07-20 ENCOUNTER — Encounter: Payer: Self-pay | Admitting: Podiatry

## 2019-08-14 ENCOUNTER — Encounter: Payer: Self-pay | Admitting: Family Medicine

## 2019-10-06 ENCOUNTER — Other Ambulatory Visit: Payer: Self-pay | Admitting: Family Medicine

## 2019-11-09 ENCOUNTER — Other Ambulatory Visit: Payer: Self-pay | Admitting: Family Medicine

## 2019-11-09 NOTE — Telephone Encounter (Signed)
E-scribed refill.  Plz schedule cpe and lab visits.  

## 2019-11-14 ENCOUNTER — Telehealth: Payer: Self-pay | Admitting: Family Medicine

## 2019-11-14 NOTE — Telephone Encounter (Signed)
Called patient and got him scheduled for CPE and labs. 

## 2019-11-14 NOTE — Telephone Encounter (Signed)
Noted  

## 2019-11-28 ENCOUNTER — Other Ambulatory Visit: Payer: Self-pay | Admitting: Family Medicine

## 2019-11-28 DIAGNOSIS — M1A00X0 Idiopathic chronic gout, unspecified site, without tophus (tophi): Secondary | ICD-10-CM

## 2019-11-28 DIAGNOSIS — IMO0002 Reserved for concepts with insufficient information to code with codable children: Secondary | ICD-10-CM

## 2019-11-28 DIAGNOSIS — Z125 Encounter for screening for malignant neoplasm of prostate: Secondary | ICD-10-CM

## 2019-11-28 DIAGNOSIS — E1165 Type 2 diabetes mellitus with hyperglycemia: Secondary | ICD-10-CM

## 2019-11-29 ENCOUNTER — Other Ambulatory Visit: Payer: Self-pay

## 2019-11-29 ENCOUNTER — Other Ambulatory Visit (INDEPENDENT_AMBULATORY_CARE_PROVIDER_SITE_OTHER): Payer: Self-pay

## 2019-11-29 DIAGNOSIS — IMO0002 Reserved for concepts with insufficient information to code with codable children: Secondary | ICD-10-CM

## 2019-11-29 DIAGNOSIS — M1A00X0 Idiopathic chronic gout, unspecified site, without tophus (tophi): Secondary | ICD-10-CM

## 2019-11-29 DIAGNOSIS — E1165 Type 2 diabetes mellitus with hyperglycemia: Secondary | ICD-10-CM

## 2019-11-29 DIAGNOSIS — Z125 Encounter for screening for malignant neoplasm of prostate: Secondary | ICD-10-CM

## 2019-11-29 DIAGNOSIS — E1149 Type 2 diabetes mellitus with other diabetic neurological complication: Secondary | ICD-10-CM

## 2019-11-29 LAB — COMPREHENSIVE METABOLIC PANEL
ALT: 21 U/L (ref 0–53)
AST: 17 U/L (ref 0–37)
Albumin: 4.3 g/dL (ref 3.5–5.2)
Alkaline Phosphatase: 93 U/L (ref 39–117)
BUN: 12 mg/dL (ref 6–23)
CO2: 29 mEq/L (ref 19–32)
Calcium: 9.8 mg/dL (ref 8.4–10.5)
Chloride: 100 mEq/L (ref 96–112)
Creatinine, Ser: 1.02 mg/dL (ref 0.40–1.50)
GFR: 91.12 mL/min (ref >60.00)
Glucose, Bld: 149 mg/dL — ABNORMAL HIGH (ref 70–99)
Potassium: 3.5 mEq/L (ref 3.5–5.1)
Sodium: 137 mEq/L (ref 135–145)
Total Bilirubin: 0.6 mg/dL (ref 0.2–1.2)
Total Protein: 7.4 g/dL (ref 6.0–8.3)

## 2019-11-29 LAB — LIPID PANEL
Cholesterol: 234 mg/dL — ABNORMAL HIGH (ref 0–200)
HDL: 40.8 mg/dL (ref >39.00)
LDL Cholesterol: 161 mg/dL — ABNORMAL HIGH (ref 0–99)
NonHDL: 193.1
Total CHOL/HDL Ratio: 6
Triglycerides: 161 mg/dL — ABNORMAL HIGH (ref 0.0–149.0)
VLDL: 32.2 mg/dL (ref 0.0–40.0)

## 2019-11-29 LAB — PSA: PSA: 0.9 ng/mL (ref 0.10–4.00)

## 2019-11-29 LAB — URIC ACID: Uric Acid, Serum: 4.7 mg/dL (ref 4.0–7.8)

## 2019-11-29 LAB — HEMOGLOBIN A1C: Hgb A1c MFr Bld: 9 % — ABNORMAL HIGH (ref 4.6–6.5)

## 2019-12-02 ENCOUNTER — Other Ambulatory Visit: Payer: Self-pay | Admitting: Family Medicine

## 2019-12-04 NOTE — Telephone Encounter (Signed)
Last refilled 05/24/19 for #90 with 1 refill. Patient was last seen on 11/28/18 and has upcoming appt on 12/06/19.

## 2019-12-06 ENCOUNTER — Encounter: Payer: Self-pay | Admitting: Family Medicine

## 2019-12-06 ENCOUNTER — Ambulatory Visit: Payer: Self-pay | Admitting: Family Medicine

## 2019-12-06 ENCOUNTER — Other Ambulatory Visit: Payer: Self-pay

## 2019-12-06 VITALS — BP 116/64 | HR 98 | Temp 97.5°F | Ht 67.25 in | Wt 240.0 lb

## 2019-12-06 DIAGNOSIS — I6930 Unspecified sequelae of cerebral infarction: Secondary | ICD-10-CM

## 2019-12-06 DIAGNOSIS — I69319 Unspecified symptoms and signs involving cognitive functions following cerebral infarction: Secondary | ICD-10-CM

## 2019-12-06 DIAGNOSIS — Z1211 Encounter for screening for malignant neoplasm of colon: Secondary | ICD-10-CM

## 2019-12-06 DIAGNOSIS — F17200 Nicotine dependence, unspecified, uncomplicated: Secondary | ICD-10-CM

## 2019-12-06 DIAGNOSIS — I69354 Hemiplegia and hemiparesis following cerebral infarction affecting left non-dominant side: Secondary | ICD-10-CM

## 2019-12-06 DIAGNOSIS — E1149 Type 2 diabetes mellitus with other diabetic neurological complication: Secondary | ICD-10-CM

## 2019-12-06 DIAGNOSIS — E1159 Type 2 diabetes mellitus with other circulatory complications: Secondary | ICD-10-CM

## 2019-12-06 DIAGNOSIS — I10 Essential (primary) hypertension: Secondary | ICD-10-CM

## 2019-12-06 DIAGNOSIS — I152 Hypertension secondary to endocrine disorders: Secondary | ICD-10-CM

## 2019-12-06 DIAGNOSIS — F321 Major depressive disorder, single episode, moderate: Secondary | ICD-10-CM

## 2019-12-06 DIAGNOSIS — E1136 Type 2 diabetes mellitus with diabetic cataract: Secondary | ICD-10-CM

## 2019-12-06 DIAGNOSIS — E1165 Type 2 diabetes mellitus with hyperglycemia: Secondary | ICD-10-CM

## 2019-12-06 DIAGNOSIS — IMO0002 Reserved for concepts with insufficient information to code with codable children: Secondary | ICD-10-CM

## 2019-12-06 MED ORDER — HYDROCHLOROTHIAZIDE 50 MG PO TABS: 25.0000 mg | ORAL_TABLET | Freq: Every day | ORAL | 3 refills | Status: DC

## 2019-12-06 MED ORDER — LISINOPRIL 40 MG PO TABS: 40.0000 mg | ORAL_TABLET | Freq: Every day | ORAL | 3 refills | Status: DC

## 2019-12-06 MED ORDER — POTASSIUM CHLORIDE ER 10 MEQ PO TBCR: 10.0000 meq | EXTENDED_RELEASE_TABLET | Freq: Every day | ORAL | 3 refills | Status: DC

## 2019-12-06 MED ORDER — ATORVASTATIN CALCIUM 80 MG PO TABS: 80.0000 mg | ORAL_TABLET | Freq: Every day | ORAL | 3 refills | Status: DC

## 2019-12-06 MED ORDER — METFORMIN HCL ER 750 MG PO TB24: ORAL_TABLET | ORAL | 3 refills | Status: DC

## 2019-12-06 MED ORDER — AMLODIPINE BESYLATE 10 MG PO TABS: 10.0000 mg | ORAL_TABLET | Freq: Every day | ORAL | 3 refills | Status: DC

## 2019-12-06 MED ORDER — CLOPIDOGREL BISULFATE 75 MG PO TABS: 75.0000 mg | ORAL_TABLET | Freq: Every day | ORAL | 3 refills | Status: DC

## 2019-12-06 NOTE — Patient Instructions (Addendum)
Pass by lab to pick up stool kit. I recommend COVID-19 shot.  Consider shingles shot - check with pharmacy.  Consider wellbutrin mood medicine to help you quit smoking.  Call to schedule eye doctor appointment with [facility] We will look into ozempic assistance program.  Return in 4 months for diabetes follow up visit.

## 2019-12-06 NOTE — Progress Notes (Signed)
This visit was conducted in person.  BP (!) 116/64 (BP Location: Left Arm, Patient Position: Sitting, Cuff Size: Large)   Pulse 98   Temp (!) 97.5 F (36.4 C)   Ht 5' 7.25" (1.708 m)   Wt (!) 240 lb (108.9 kg)   SpO2 93%   BMI 37.31 kg/m    CC: CPE - converted to f/u visit  Subjective:    Patient ID: Thomas Howard, male    DOB: [birth year], 59 y.o.   MRN: [id number]  HPI: Thomas Howard is a 59 y.o. male presenting on 12/06/2019 for Annual Exam   Self pay. Approved for disability. Planning to start Medicare card 03/2020. Disability after lacunar stroke 09/2017 with residual neurological deficits (L hemiparesis). Wife April drove him today.   Confusion over what meds he's taking - wife manages medications. I asked wife back to review all meds. She states he's taking only 7 medications daily but his list has 9. Not taking celexa, has not been taking atorvastatin (forgets to take at night).   DM - on metformin 1046m only once daily - forgets night time dose Trouble affording medications. Completed jardiance and ozempic application forms - [place] [facility] [place] but has only been receiving jardiance, not ozempic.  Lab Results  Component Value Date   HGBA1C 9.0 (H) 11/29/2019     HTN - well controlled at home.  Saw podiatry 04/2019.   No falls this past year.   Preventative:  Colon cancer screening - provided with iFOB today.  Prostate cancer screening - discussed, would like to screen. Flu shot - yearly Pneumovax 01/2018 Tdap 06/2013 shingrix - discusse,d declines  COVID vaccine - declines  Seat belt use discussed  Sunscreen use discussed. No changing moles on skin.  Smoking - 2 ppd  Alcohol - rare  Dentist - due  Eye exam - DUE ([facility]) Bowel - no constipation, some diarrhea from metformin Bladder - no incontinence  Caffeine: has cut down on sodas  Lives with GF, 1 cat. 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 Interim medical history since our last visit reviewed. Allergies and medications reviewed and updated. Outpatient Medications Prior to Visit  Medication Sig Dispense Refill  . Blood Glucose Monitoring Suppl (ONE TOUCH ULTRA 2) w/Device KIT Use as directed E11.49 1 each 0  . empagliflozin (JARDIANCE) 25 MG TABS tablet Take 25 mg by mouth daily.    .Marland Kitchenglucose blood (ONE TOUCH ULTRA TEST) test strip Use as instructed to check sugars BID E11.49 100 each 3  . Lancets (ONETOUCH ULTRASOFT) lancets Use as instructed to check sugars BID E11.49 100 each 3  . amLODipine (NORVASC) 10 MG tablet Take 1 tablet (10 mg total) by mouth daily. 90 tablet 1  . atorvastatin (LIPITOR) 80 MG tablet TAKE 1 TABLET BY MOUTH EVERYDAY AT BEDTIME 90 tablet 3  . citalopram (CELEXA) 10 MG tablet TAKE 1 TABLET BY MOUTH EVERY DAY 30 tablet 2  . clopidogrel (PLAVIX) 75 MG tablet TAKE 1 TABLET BY MOUTH EVERY DAY 90 tablet 1  . hydrochlorothiazide (HYDRODIURIL) 50 MG tablet TAKE 1 TABLET BY MOUTH EVERY DAY (Patient taking differently: 25 mg. ) 90 tablet 1  . lisinopril (ZESTRIL) 40 MG tablet Take 1 tablet (40 mg total)  by mouth daily. 90 tablet 1  . metFORMIN (GLUCOPHAGE) 500 MG tablet Take 1 tablet (500 mg total) by mouth 2 (two) times daily with a meal.    . potassium chloride (KLOR-CON) 10 MEQ tablet TAKE 1 TABLET BY MOUTH EVERY DAY 90 tablet 0  . Semaglutide (OZEMPIC, 0.25 OR 0.5 MG/DOSE, [place]) Inject 0.5 mg into the skin once a week.    . colchicine 0.6 MG tablet Take 1 tablet (0.6 mg total) by mouth daily as needed (gout flare). (Patient not taking: Reported on 12/06/2019) 30 tablet 1  . amLODipine-atorvastatin (CADUET) 10-80 MG tablet Take 1 tablet by mouth daily. 30 tablet 3  . aspirin 81 MG EC  tablet Take 81 mg by mouth daily.  0   No facility-administered medications prior to visit.     Per HPI unless specifically indicated in ROS section below Review of Systems  Constitutional: Negative for activity change, appetite change, chills, fatigue, fever and unexpected weight change.  HENT: Negative for hearing loss.   Eyes: Negative for visual disturbance.  Respiratory: Negative for cough, chest tightness, shortness of breath and wheezing.   Cardiovascular: Negative for chest pain, palpitations and leg swelling.  Gastrointestinal: Negative for abdominal distention, abdominal pain, blood in stool, constipation, diarrhea, nausea and vomiting.  Genitourinary: Negative for difficulty urinating and hematuria.  Musculoskeletal: Negative for arthralgias, myalgias and neck pain.  Skin: Negative for rash.  Neurological: Negative for dizziness, seizures, syncope and headaches.  Hematological: Negative for adenopathy. Does not bruise/bleed easily.  Psychiatric/Behavioral: Negative for dysphoric mood. The patient is not nervous/anxious.    Objective:  BP (!) 116/64 (BP Location: Left Arm, Patient Position: Sitting, Cuff Size: Large)   Pulse 98   Temp (!) 97.5 F (36.4 C)   Ht 5' 7.25" (1.708 m)   Wt (!) 240 lb (108.9 kg)   SpO2 93%   BMI 37.31 kg/m   Wt Readings from Last 3 Encounters:  12/06/19 (!) 240 lb (108.9 kg)  03/29/19 238 lb 8 oz (108.2 kg)  11/28/18 233 lb 11.2 oz (106 kg)      Physical Exam Vitals and nursing note reviewed.  Constitutional:      General: He is not in acute distress.    Appearance: Normal appearance. He is well-developed. He is not ill-appearing.  HENT:     Head: Normocephalic and atraumatic.     Right Ear: Hearing, tympanic membrane, ear canal and external ear normal.     Left Ear: Hearing, tympanic membrane, ear canal and external ear normal.  Eyes:     General: No scleral icterus.    Extraocular Movements: Extraocular movements intact.      Conjunctiva/sclera: Conjunctivae normal.     Pupils: Pupils are equal, round, and reactive to light.     Comments: Cataracts present bilaterally  Cardiovascular:     Rate and Rhythm: Normal rate and regular rhythm.     Pulses: Normal pulses.          Radial pulses are 2+ on the right side and 2+ on the left side.     Heart sounds: Normal heart sounds. No murmur heard.   Pulmonary:     Effort: Pulmonary effort is normal. No respiratory distress.     Breath sounds: Normal breath sounds. No wheezing, rhonchi or rales.  Abdominal:     General: Abdomen is flat. Bowel sounds are normal. There is no distension.     Palpations: Abdomen is soft. There is no mass.  Tenderness: There is no abdominal tenderness. There is no guarding or rebound.     Hernia: No hernia is present.  Musculoskeletal:        General: Normal range of motion.     Cervical back: Normal range of motion and neck supple.     Right lower leg: No edema.     Left lower leg: No edema.  Lymphadenopathy:     Cervical: No cervical adenopathy.  Skin:    General: Skin is warm and dry.     Findings: No rash.  Neurological:     Mental Status: He is alert.  Psychiatric:        Mood and Affect: Mood normal.        Behavior: Behavior normal.        Thought Content: Thought content normal.        Judgment: Judgment normal.       Results for orders placed or performed in visit on 11/29/19  PSA  Result Value Ref Range   PSA 0.90 0.10 - 4.00 ng/mL  Uric acid  Result Value Ref Range   Uric Acid, Serum 4.7 4.0 - 7.8 mg/dL  Hemoglobin A1c  Result Value Ref Range   Hgb A1c MFr Bld 9.0 (H) 4.6 - 6.5 %  Comprehensive metabolic panel  Result Value Ref Range   Sodium 137 135 - 145 mEq/L   Potassium 3.5 3.5 - 5.1 mEq/L   Chloride 100 96 - 112 mEq/L   CO2 29 19 - 32 mEq/L   Glucose, Bld 149 (H) 70 - 99 mg/dL   BUN 12 6 - 23 mg/dL   Creatinine, Ser 1.02 0.40 - 1.50 mg/dL   Total Bilirubin 0.6 0.2 - 1.2 mg/dL   Alkaline  Phosphatase 93 39 - 117 U/L   AST 17 0 - 37 U/L   ALT 21 0 - 53 U/L   Total Protein 7.4 6.0 - 8.3 g/dL   Albumin 4.3 3.5 - 5.2 g/dL   GFR 91.12 >60.00 mL/min   Calcium 9.8 8.4 - 10.5 mg/dL  Lipid panel  Result Value Ref Range   Cholesterol 234 (H) 0 - 200 mg/dL   Triglycerides 161.0 (H) 0 - 149 mg/dL   HDL 40.80 >39.00 mg/dL   VLDL 32.2 0.0 - 40.0 mg/dL   LDL Cholesterol 161 (H) 0 - 99 mg/dL   Total CHOL/HDL Ratio 6    NonHDL 193.10    Assessment & Plan:  This visit occurred during the SARS-CoV-2 public health emergency.  Safety protocols were in place, including screening questions prior to the visit, additional usage of staff PPE, and extensive cleaning of exam room while observing appropriate contact time as indicated for disinfecting solutions.   Problem List Items Addressed This Visit    Uncontrolled type 2 diabetes mellitus with neurologic complication, without long-term current use of insulin ([place]) - Primary    Chronic, uncontrolled. Misses PM metformin dose, and endorses GI upset (chronic loose stools) since starting metformin. Will convert to XR formulation start at 784m daily for 1-2 wks and if tolerated well, increase to 15071mXR daily.  Has not been taking ozempic - will ask LiLattie Hawo call [employer] patient assistance program to inquire bout this.       Relevant Medications   empagliflozin (JARDIANCE) 25 MG TABS tablet   atorvastatin (LIPITOR) 80 MG tablet   lisinopril (ZESTRIL) 40 MG tablet   metFORMIN (GLUCOPHAGE XR) 750 MG 24 hr tablet   Smoker    Up  to 2 PPD. Continued to encouraged cessation - reviewed NRT as well as non-nicotine options specifically wellbutrin.       Severe obesity (BMI 35.0-39.9) with comorbidity ([place])    Continue to encourage healthy diet and lifestyle choices for sustainable weight loss.       Relevant Medications   empagliflozin (JARDIANCE) 25 MG TABS tablet   metFORMIN (GLUCOPHAGE XR) 750 MG 24 hr tablet   MDD (major depressive  disorder), single episode, moderate ([place])    He self stopped celexa and desires to stay off this. Denies significant depressed mood.       Hypertension associated with diabetes ([facility])    Chronic, stable. Continue current regimen.       Relevant Medications   empagliflozin (JARDIANCE) 25 MG TABS tablet   amLODipine (NORVASC) 10 MG tablet   atorvastatin (LIPITOR) 80 MG tablet   hydrochlorothiazide (HYDRODIURIL) 50 MG tablet   lisinopril (ZESTRIL) 40 MG tablet   metFORMIN (GLUCOPHAGE XR) 750 MG 24 hr tablet   History of stroke with residual deficit    R basal ganglia lacunar infarct 09/2017 s/p residual L heimparesis.   Continues plavix as only blood thinner.  Has not been taking his atorvastatin - forgetful of PM dosing - advised to just take in the morning with all his other medications, reviewed importance of lipid control to prevent recurrent stroke.       Hemiplegia and hemiparesis following cerebral infarction affecting left non-dominant side ([facility])   Diabetic cataract of both eyes ([place])    Encouraged f/u eye exam ([facility])      Relevant Medications   empagliflozin (JARDIANCE) 25 MG TABS tablet   atorvastatin (LIPITOR) 80 MG tablet   lisinopril (ZESTRIL) 40 MG tablet   metFORMIN (GLUCOPHAGE XR) 750 MG 24 hr tablet   Cognitive deficit as late effect of cerebrovascular accident (CVA)    Other Visit Diagnoses    Special screening for malignant neoplasms, colon       Relevant Orders   Fecal occult blood, imunochemical       Meds ordered this encounter  Medications  . amLODipine (NORVASC) 10 MG tablet    Sig: Take 1 tablet (10 mg total) by mouth daily.    Dispense:  90 tablet    Refill:  3  . atorvastatin (LIPITOR) 80 MG tablet    Sig: Take 1 tablet (80 mg total) by mouth daily.    Dispense:  90 tablet    Refill:  3  . clopidogrel (PLAVIX) 75 MG tablet    Sig: Take 1 tablet (75 mg total) by mouth daily.    Dispense:  90 tablet    Refill:  3  . hydrochlorothiazide  (HYDRODIURIL) 50 MG tablet    Sig: Take 0.5 tablets (25 mg total) by mouth daily.    Dispense:  45 tablet    Refill:  3  . potassium chloride (KLOR-CON) 10 MEQ tablet    Sig: Take 1 tablet (10 mEq total) by mouth daily.    Dispense:  90 tablet    Refill:  3  . lisinopril (ZESTRIL) 40 MG tablet    Sig: Take 1 tablet (40 mg total) by mouth daily.    Dispense:  90 tablet    Refill:  3  . metFORMIN (GLUCOPHAGE XR) 750 MG 24 hr tablet    Sig: Take 1 tablet (750 mg total) by mouth daily with breakfast for 7 days, THEN 2 tablets (1,500 mg total) daily with breakfast.  Dispense:  180 tablet    Refill:  3    To replace plain metformin   Orders Placed This Encounter  Procedures  . Fecal occult blood, imunochemical    Standing Status:   Future    Standing Expiration Date:   12/05/2020    Patient Instructions  Pass by lab to pick up stool kit. I recommend COVID-19 shot.  Consider shingles shot - check with pharmacy.  Consider wellbutrin mood medicine to help you quit smoking.  Call to schedule eye doctor appointment with [facility] We will look into ozempic assistance program.  Return in 4 months for diabetes follow up visit.    Follow up plan: Return in about 4 months (around 04/07/2020) for follow up visit.  Ria Bush, MD

## 2019-12-10 ENCOUNTER — Encounter: Payer: Self-pay | Admitting: Family Medicine

## 2019-12-10 ENCOUNTER — Telehealth: Payer: Self-pay | Admitting: Family Medicine

## 2019-12-10 NOTE — Assessment & Plan Note (Addendum)
R basal ganglia lacunar infarct 09/2017 s/p residual L heimparesis.   Continues plavix as only blood thinner.  Has not been taking his atorvastatin - forgetful of PM dosing - advised to just take in the morning with all his other medications, reviewed importance of lipid control to prevent recurrent stroke.

## 2019-12-10 NOTE — Assessment & Plan Note (Signed)
Chronic, uncontrolled. Misses PM metformin dose, and endorses GI upset (chronic loose stools) since starting metformin. Will convert to XR formulation start at 750mg  daily for 1-2 wks and if tolerated well, increase to 1500mg  XR daily.  Has not been taking ozempic - will ask to call [employer] patient assistance program to inquire bout this.

## 2019-12-10 NOTE — Assessment & Plan Note (Signed)
Continue to encourage healthy diet and lifestyle choices for sustainable weight loss  

## 2019-12-10 NOTE — Assessment & Plan Note (Signed)
He self stopped celexa and desires to stay off this. Denies significant depressed mood.

## 2019-12-10 NOTE — Assessment & Plan Note (Signed)
Encouraged f/u eye exam (Thomas Howard)

## 2019-12-10 NOTE — Assessment & Plan Note (Signed)
Up to 2 PPD. Continued to encouraged cessation - reviewed NRT as well as non-nicotine options specifically wellbutrin.

## 2019-12-10 NOTE — Assessment & Plan Note (Signed)
Chronic, stable. Continue current regimen. 

## 2019-12-10 NOTE — Telephone Encounter (Signed)
Saunders/wife April state he has been receiving assistance through patient assistance [employer] (see [employer] letter under media section from 04/27/2020) however he applied for both ozempic and jardiance, and has only been receiving jardiance not ozempic. Can we call to inquire about the ozempic? He's not been on this but he should be.  To start Medicare 03/2020

## 2019-12-14 NOTE — Telephone Encounter (Signed)
Spoke with [employer] asking about Ozempic.  Told they needed income info and a SSN for the pt to complete the app.  However, after multiple attempts from them to contact pt with no response, the app was closed.  They will fax a new app today to be completed by both the provider and pt then fax back.  FYI to Dr. Reece Agar.

## 2020-09-14 ENCOUNTER — Encounter: Payer: Self-pay | Admitting: Family Medicine

## 2020-09-14 ENCOUNTER — Other Ambulatory Visit: Payer: Self-pay

## 2020-09-14 ENCOUNTER — Inpatient Hospital Stay
Admission: EM | Admit: 2020-09-14 | Discharge: 2020-09-16 | DRG: 065 | Disposition: A | Discharge status: Home / Self Care | Payer: Medicare Other | Attending: Internal Medicine | Admitting: Internal Medicine

## 2020-09-14 ENCOUNTER — Emergency Department: Payer: Medicare Other

## 2020-09-14 ENCOUNTER — Observation Stay: Payer: Medicare Other

## 2020-09-14 ENCOUNTER — Encounter: Payer: Self-pay | Admitting: Intensive Care

## 2020-09-14 DIAGNOSIS — I63421 Cerebral infarction due to embolism of right anterior cerebral artery: Secondary | ICD-10-CM

## 2020-09-14 DIAGNOSIS — Z7984 Long term (current) use of oral hypoglycemic drugs: Secondary | ICD-10-CM

## 2020-09-14 DIAGNOSIS — R29701 NIHSS score 1: Secondary | ICD-10-CM | POA: Diagnosis present

## 2020-09-14 DIAGNOSIS — I69354 Hemiplegia and hemiparesis following cerebral infarction affecting left non-dominant side: Secondary | ICD-10-CM

## 2020-09-14 DIAGNOSIS — G4733 Obstructive sleep apnea (adult) (pediatric): Secondary | ICD-10-CM | POA: Diagnosis present

## 2020-09-14 DIAGNOSIS — Z20822 Contact with and (suspected) exposure to covid-19: Secondary | ICD-10-CM | POA: Diagnosis present

## 2020-09-14 DIAGNOSIS — F321 Major depressive disorder, single episode, moderate: Secondary | ICD-10-CM

## 2020-09-14 DIAGNOSIS — Z833 Family history of diabetes mellitus: Secondary | ICD-10-CM

## 2020-09-14 DIAGNOSIS — I6612 Occlusion and stenosis of left anterior cerebral artery: Secondary | ICD-10-CM

## 2020-09-14 DIAGNOSIS — I63231 Cerebral infarction due to unspecified occlusion or stenosis of right carotid arteries: Secondary | ICD-10-CM | POA: Diagnosis not present

## 2020-09-14 DIAGNOSIS — M109 Gout, unspecified: Secondary | ICD-10-CM | POA: Diagnosis present

## 2020-09-14 DIAGNOSIS — I6930 Unspecified sequelae of cerebral infarction: Secondary | ICD-10-CM

## 2020-09-14 DIAGNOSIS — E7800 Pure hypercholesterolemia, unspecified: Secondary | ICD-10-CM | POA: Diagnosis present

## 2020-09-14 DIAGNOSIS — Z7902 Long term (current) use of antithrombotics/antiplatelets: Secondary | ICD-10-CM

## 2020-09-14 DIAGNOSIS — Z28310 Unvaccinated for covid-19: Secondary | ICD-10-CM

## 2020-09-14 DIAGNOSIS — I10 Essential (primary) hypertension: Secondary | ICD-10-CM | POA: Diagnosis present

## 2020-09-14 DIAGNOSIS — I639 Cerebral infarction, unspecified: Secondary | ICD-10-CM | POA: Diagnosis present

## 2020-09-14 DIAGNOSIS — F17210 Nicotine dependence, cigarettes, uncomplicated: Secondary | ICD-10-CM | POA: Diagnosis present

## 2020-09-14 DIAGNOSIS — R4781 Slurred speech: Secondary | ICD-10-CM | POA: Diagnosis present

## 2020-09-14 DIAGNOSIS — E785 Hyperlipidemia, unspecified: Secondary | ICD-10-CM | POA: Diagnosis present

## 2020-09-14 DIAGNOSIS — E1169 Type 2 diabetes mellitus with other specified complication: Secondary | ICD-10-CM | POA: Diagnosis present

## 2020-09-14 DIAGNOSIS — F17200 Nicotine dependence, unspecified, uncomplicated: Secondary | ICD-10-CM

## 2020-09-14 DIAGNOSIS — E1159 Type 2 diabetes mellitus with other circulatory complications: Secondary | ICD-10-CM | POA: Diagnosis not present

## 2020-09-14 DIAGNOSIS — Z6836 Body mass index (BMI) 36.0-36.9, adult: Secondary | ICD-10-CM

## 2020-09-14 DIAGNOSIS — Z79899 Other long term (current) drug therapy: Secondary | ICD-10-CM

## 2020-09-14 DIAGNOSIS — R42 Dizziness and giddiness: Secondary | ICD-10-CM | POA: Diagnosis not present

## 2020-09-14 DIAGNOSIS — IMO0002 Reserved for concepts with insufficient information to code with codable children: Secondary | ICD-10-CM

## 2020-09-14 DIAGNOSIS — E876 Hypokalemia: Secondary | ICD-10-CM | POA: Diagnosis present

## 2020-09-14 DIAGNOSIS — E1149 Type 2 diabetes mellitus with other diabetic neurological complication: Secondary | ICD-10-CM | POA: Diagnosis present

## 2020-09-14 DIAGNOSIS — R531 Weakness: Secondary | ICD-10-CM

## 2020-09-14 DIAGNOSIS — E1165 Type 2 diabetes mellitus with hyperglycemia: Secondary | ICD-10-CM | POA: Diagnosis present

## 2020-09-14 DIAGNOSIS — I152 Hypertension secondary to endocrine disorders: Secondary | ICD-10-CM | POA: Diagnosis present

## 2020-09-14 LAB — COMPREHENSIVE METABOLIC PANEL
ALT: 28 U/L (ref 0–44)
AST: 21 U/L (ref 15–41)
Albumin: 4.2 g/dL (ref 3.5–5.0)
Alkaline Phosphatase: 95 U/L (ref 38–126)
Anion gap: 9 (ref 5–15)
BUN: 13 mg/dL (ref 6–20)
CO2: 25 mmol/L (ref 22–32)
Calcium: 9.1 mg/dL (ref 8.9–10.3)
Chloride: 99 mmol/L (ref 98–111)
Creatinine, Ser: 1.12 mg/dL (ref 0.61–1.24)
GFR, Estimated: >60 mL/min (ref >60)
Glucose, Bld: 262 mg/dL — ABNORMAL HIGH (ref 70–99)
Potassium: 3.6 mmol/L (ref 3.5–5.1)
Sodium: 133 mmol/L — ABNORMAL LOW (ref 135–145)
Total Bilirubin: 0.8 mg/dL (ref 0.3–1.2)
Total Protein: 8 g/dL (ref 6.5–8.1)

## 2020-09-14 LAB — CBC
HCT: 44.6 % (ref 39.0–52.0)
Hemoglobin: 15.1 g/dL (ref 13.0–17.0)
MCH: 29.3 pg (ref 26.0–34.0)
MCHC: 33.9 g/dL (ref 30.0–36.0)
MCV: 86.4 fL (ref 80.0–100.0)
Platelets: 242 10*3/uL (ref 150–400)
RBC: 5.16 MIL/uL (ref 4.22–5.81)
RDW: 12.8 % (ref 11.5–15.5)
WBC: 5.9 10*3/uL (ref 4.0–10.5)
nRBC: 0 % (ref 0.0–0.2)

## 2020-09-14 LAB — APTT: aPTT: 28 seconds (ref 24–36)

## 2020-09-14 LAB — DIFFERENTIAL
Abs Immature Granulocytes: 0.02 10*3/uL (ref 0.00–0.07)
Basophils Absolute: 0.1 10*3/uL (ref 0.0–0.1)
Basophils Relative: 1 %
Eosinophils Absolute: 0.3 10*3/uL (ref 0.0–0.5)
Eosinophils Relative: 6 %
Immature Granulocytes: 0 %
Lymphocytes Relative: 34 %
Lymphs Abs: 2 10*3/uL (ref 0.7–4.0)
Monocytes Absolute: 0.5 10*3/uL (ref 0.1–1.0)
Monocytes Relative: 8 %
Neutro Abs: 3 10*3/uL (ref 1.7–7.7)
Neutrophils Relative %: 51 %

## 2020-09-14 LAB — PROTIME-INR
INR: 1 (ref 0.8–1.2)
Prothrombin Time: 13.5 seconds (ref 11.4–15.2)

## 2020-09-14 MED ORDER — ACETAMINOPHEN 160 MG/5ML PO SOLN
650.0000 mg | ORAL | Status: DC | PRN
  Filled 2020-09-14: qty 20.3

## 2020-09-14 MED ORDER — ASPIRIN EC 81 MG PO TBEC
81.0000 mg | DELAYED_RELEASE_TABLET | Freq: Every day | ORAL | Status: DC
  Administered 2020-09-15 – 2020-09-16 (×2): 81 mg via ORAL
  Filled 2020-09-14 (×2): qty 1

## 2020-09-14 MED ORDER — ACETAMINOPHEN 650 MG RE SUPP: 650.0000 mg | RECTAL | Status: DC | PRN

## 2020-09-14 MED ORDER — METFORMIN HCL 500 MG PO TABS
500.0000 mg | ORAL_TABLET | Freq: Two times a day (BID) | ORAL | Status: DC
  Filled 2020-09-14: qty 1

## 2020-09-14 MED ORDER — METFORMIN HCL ER 750 MG PO TB24: 750.0000 mg | ORAL_TABLET | Freq: Every day | ORAL | Status: DC

## 2020-09-14 MED ORDER — INSULIN ASPART 100 UNIT/ML IJ SOLN
0.0000 [IU] | Freq: Three times a day (TID) | INTRAMUSCULAR | Status: DC
  Administered 2020-09-15: 5 [IU] via SUBCUTANEOUS
  Administered 2020-09-15: 13:00:00 11 [IU] via SUBCUTANEOUS
  Administered 2020-09-16 (×2): 3 [IU] via SUBCUTANEOUS
  Filled 2020-09-14 (×4): qty 1

## 2020-09-14 MED ORDER — ENOXAPARIN SODIUM 40 MG/0.4ML IJ SOSY: 40.0000 mg | PREFILLED_SYRINGE | INTRAMUSCULAR | Status: DC

## 2020-09-14 MED ORDER — SENNOSIDES-DOCUSATE SODIUM 8.6-50 MG PO TABS: 1.0000 | ORAL_TABLET | Freq: Every evening | ORAL | Status: DC | PRN

## 2020-09-14 MED ORDER — INSULIN ASPART 100 UNIT/ML IJ SOLN
0.0000 [IU] | Freq: Every day | INTRAMUSCULAR | Status: DC
  Administered 2020-09-15: 2 [IU] via SUBCUTANEOUS
  Filled 2020-09-14: qty 1

## 2020-09-14 MED ORDER — ENOXAPARIN SODIUM 60 MG/0.6ML IJ SOSY
0.5000 mg/kg | PREFILLED_SYRINGE | INTRAMUSCULAR | Status: DC
  Administered 2020-09-15 (×2): 52.5 mg via SUBCUTANEOUS
  Filled 2020-09-14 (×2): qty 0.6

## 2020-09-14 MED ORDER — LABETALOL HCL 5 MG/ML IV SOLN
5.0000 mg | INTRAVENOUS | Status: AC | PRN
  Administered 2020-09-15: 5 mg via INTRAVENOUS
  Filled 2020-09-14 (×4): qty 4

## 2020-09-14 MED ORDER — CLOPIDOGREL BISULFATE 75 MG PO TABS
75.0000 mg | ORAL_TABLET | Freq: Every day | ORAL | Status: DC
  Administered 2020-09-15 – 2020-09-16 (×2): 75 mg via ORAL
  Filled 2020-09-14 (×2): qty 1

## 2020-09-14 MED ORDER — ACETAMINOPHEN 325 MG PO TABS: 650.0000 mg | ORAL_TABLET | ORAL | Status: DC | PRN

## 2020-09-14 MED ORDER — ATORVASTATIN CALCIUM 20 MG PO TABS
80.0000 mg | ORAL_TABLET | Freq: Every day | ORAL | Status: DC
  Administered 2020-09-15 – 2020-09-16 (×2): 80 mg via ORAL
  Filled 2020-09-14 (×2): qty 4

## 2020-09-14 MED ORDER — ASPIRIN 81 MG PO CHEW
324.0000 mg | CHEWABLE_TABLET | Freq: Once | ORAL | Status: AC
  Administered 2020-09-15: 324 mg via ORAL
  Filled 2020-09-14: qty 4

## 2020-09-14 MED ORDER — SODIUM CHLORIDE 0.9 % IV SOLN: INTRAVENOUS | Status: DC

## 2020-09-14 MED ORDER — STROKE: EARLY STAGES OF RECOVERY BOOK: Freq: Once | Status: AC

## 2020-09-14 NOTE — ED Provider Notes (Signed)
[facility] Emergency Department Provider Note  ____________________________________________  Time seen: Approximately 8:20 PM  I have reviewed the triage vital signs and the nursing notes.   HISTORY  Chief Complaint Weakness    HPI Thomas Howard is a 60 y.o. male who presents the emergency department complaining of left-sided weakness, dizziness.  Patient states that last night he went to bed around 10:00 feeling his complete normal self.  Patient woke up this morning and has felt "intoxicated" throughout the day.  Patient states that he feels as if he was drunk and cannot maintain his balance.  He states that he is also experiencing some left-sided weakness.  Patient does have a history of previous stroke that affected the left side.  Symptoms are similar.  He denies any headache, vision changes.  No chest pain or shortness of breath.  Patient states that last night he can walk, doing activity that he went without difficulty.  Patient states that today if he was going to stand up, he feels like he would not be able to control his balance and would fall over.  When standing from the wheelchair to the bed nursing reported that patient cannot maintain his own balance.  Patient reports weakness on the left side though is able to move both the arm and leg.  Patient does not take any medication for his complaint prior to arrival history of diabetes, hypertension, lacunar stroke, sleep apnea.         Past Medical History:  Diagnosis Date  . Diabetes mellitus without complication ([place])   . Gout 05/12/2018   Endorses longstanding dx  . History of smoking   . HTN (hypertension)   . Hyperlipemia   . Lacunar stroke of right subthalamic region ([place]) 11/30/2017  . Obesity   . Sleep apnea   . Stroke ([place])   . Wears partial dentures     Patient Active Problem List   Diagnosis Date Noted  . Pain due to onychomycosis of toenails of both feet 04/20/2019  . Diabetic cataract  of both eyes ([place]) 03/29/2019  . MDD (major depressive disorder), single episode, moderate ([place]) 11/28/2018  . Cognitive deficit as late effect of cerebrovascular accident (CVA) 11/11/2018  . Gout 05/12/2018  . Occlusion and stenosis of left carotid artery 11/30/2017  . Hemiplegia and hemiparesis following cerebral infarction affecting left non-dominant side ([place]) 11/20/2017  . Vertigo 11/20/2017  . Uncontrolled type 2 diabetes mellitus with neurologic complication, without long-term current use of insulin ([place]) 10/17/2017  . History of stroke with residual deficit 09/30/2017  . OSA (obstructive sleep apnea) 09/30/2017  . Smoker 09/30/2017  . 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 Health care maintenance 06/20/2013  . Severe obesity (BMI 35.0-39.9) with comorbidity ([place]) 12/15/2011  . Skin rash 07/07/2011  . Hypertension associated with diabetes [facility])     Past Surgical History:  Procedure Laterality Date  . MOUTH SURGERY    . WISDOM TOOTH EXTRACTION      Prior to Admission medications   Medication Sig Start Date End Date Taking? Authorizing Provider  amLODipine (NORVASC) 10 MG tablet Take 1 tablet (10 mg total) by mouth daily. 12/06/19   Ria Bush, MD  atorvastatin (LIPITOR) 80 MG tablet Take 1 tablet (80 mg total) by mouth daily. 12/06/19   Ria Bush, MD  Blood Glucose Monitoring Suppl (ONE TOUCH ULTRA 2) w/Device KIT Use as directed E11.49 12/14/17   Ria Bush, MD  clopidogrel (PLAVIX) 75 MG tablet  Take 1 tablet (75 mg total) by mouth daily. 12/06/19   Ria Bush, MD  colchicine 0.6 MG tablet Take 1 tablet (0.6 mg total) by mouth daily as needed (gout flare). Patient not taking: Reported on 12/06/2019 05/12/18   Ria Bush, MD  empagliflozin (JARDIANCE) 25 MG TABS tablet Take 25 mg by mouth daily.    [provider]  glucose blood (ONE TOUCH ULTRA TEST) test strip Use as instructed to check sugars BID E11.49  12/14/17   Ria Bush, MD  hydrochlorothiazide (HYDRODIURIL) 50 MG tablet Take 0.5 tablets (25 mg total) by mouth daily. 12/06/19   Ria Bush, MD  Lancets [facility] ULTRASOFT) lancets Use as instructed to check sugars BID E11.49 12/14/17   Ria Bush, MD  lisinopril (ZESTRIL) 40 MG tablet Take 1 tablet (40 mg total) by mouth daily. 12/06/19   Ria Bush, MD  metFORMIN (GLUCOPHAGE XR) 750 MG 24 hr tablet Take 1 tablet (750 mg total) by mouth daily with breakfast for 7 days, THEN 2 tablets (1,500 mg total) daily with breakfast. 12/06/19 01/12/20  Ria Bush, MD  potassium chloride (KLOR-CON) 10 MEQ tablet Take 1 tablet (10 mEq total) by mouth daily. 12/06/19   Ria Bush, MD    Allergies Patient has no known allergies.  Family History  Problem Relation Age of Onset  . Diabetes Mother   . Hypertension Mother   . Diabetes Maternal Grandmother   . Hypertension Maternal Grandmother   . Coronary artery disease Neg Hx   . Stroke Neg Hx   . Cancer Neg Hx     Social History Social History   Tobacco Use  . Smoking status: Current Every Day Smoker    Packs/day: 1.00    Years: 20.00    Pack years: 20.00    Types: Cigarettes  . Smokeless tobacco: Never Used  . Tobacco comment: vaped for 5 years  Substance Use Topics  . Alcohol use: Yes    Alcohol/week: 1.0 standard drink    Types: 1 Shots of liquor per week    Comment: Occasional - not on a weekly basis  . Drug use: No     Review of Systems  Constitutional: No fever/chills Eyes: No visual changes. No discharge ENT: No upper respiratory complaints. Cardiovascular: no chest pain. Respiratory: no cough. No SOB. Gastrointestinal: No abdominal pain.  No nausea, no vomiting.  No diarrhea.  No constipation. Genitourinary: Negative for dysuria. No hematuria Musculoskeletal: Negative for musculoskeletal pain. Skin: Negative for rash, abrasions, lacerations, ecchymosis. Neurological: Positive for profound  dizziness, left-sided weakness.  Negative for headaches or numbness.  10 System ROS otherwise negative.  ____________________________________________   PHYSICAL EXAM:  VITAL SIGNS: ED Triage Vitals [09/14/20 1815]  Enc Vitals Group     BP (!) 176/93     Pulse Rate 98     Resp 16     Temp 98.3 F (36.8 C)     Temp Source Oral     SpO2 99 %     Weight 230 lb (104.3 kg)     Height _0  (1.702 m)     Head Circumference      Peak Flow      Pain Score 0     Pain Loc      Pain Edu?      Excl. in [place]?      Constitutional: Alert and oriented. Well appearing and in no acute distress. Eyes: Conjunctivae are normal. PERRL. EOMI. Head: Atraumatic. ENT:  Ears:       Nose: No congestion/rhinnorhea.      Mouth/Throat: Mucous membranes are moist.  Neck: No stridor.    Cardiovascular: Normal rate, regular rhythm. Normal S1 and S2.  Good peripheral circulation. Respiratory: Normal respiratory effort without tachypnea or retractions. Lungs CTAB. Good air entry to the bases with no decreased or absent breath sounds. Musculoskeletal: Full range of motion to all extremities. No gross deformities appreciated. Neurologic:  Normal speech and language. No gross focal neurologic deficits are appreciated.  Cranial nerves II through XII grossly intact.  Slightly decreased strength in the left upper extremity when compared with right.  Mild pronator drift left side.  Sensation intact and equal bilateral upper and lower extremities. Skin:  Skin is warm, dry and intact. No rash noted. Psychiatric: Mood and affect are normal. Speech and behavior are normal. Patient exhibits appropriate insight and judgement.   ____________________________________________   LABS (all labs ordered are listed, but only abnormal results are displayed)  Labs Reviewed  COMPREHENSIVE METABOLIC PANEL - Abnormal; Notable for the following components:      Result Value   Sodium 133 (*)    Glucose, Bld 262 (*)    All  other components within normal limits  PROTIME-INR  APTT  CBC  DIFFERENTIAL  CBG MONITORING, ED   ____________________________________________  EKG   ____________________________________________  RADIOLOGY I personally viewed and evaluated these images as part of my medical decision making, as well as reviewing the written report by the radiologist.  ED Provider Interpretation: No evidence of acute intracranial hemorrhage or infarct.  Changes consistent with prior infarct are identified  CT HEAD WO CONTRAST  Result Date: 09/14/2020 CLINICAL DATA:  Weakness and dizziness for several hours EXAM: CT HEAD WITHOUT CONTRAST TECHNIQUE: Contiguous axial images were obtained from the base of the skull through the vertex without intravenous contrast. COMPARISON:  None. FINDINGS: Brain: No evidence of acute infarction, hemorrhage, hydrocephalus, extra-axial collection or mass lesion/mass effect. Areas of prior lacunar infarcts are noted in the region of the anterior limb of the internal capsule on the right as well as the adjacent basal ganglia and deep white matter. Vascular: No hyperdense vessel or unexpected calcification. Skull: Normal. Negative for fracture or focal lesion. Sinuses/Orbits: No acute finding. Other: Arachnoid granulation is noted adjacent to the transverse sinus on the right best seen on sagittal image 14, coronal image 58 and axial image 11. IMPRESSION: Changes of prior infarct on the right as described. No acute abnormality is noted. Electronically Signed   By: Inez Catalina M.D.   On: 09/14/2020 19:22    ____________________________________________    PROCEDURES  Procedure(s) performed:    Procedures    Medications - No data to display   ____________________________________________   INITIAL IMPRESSION / ASSESSMENT AND PLAN / ED COURSE  Pertinent labs & imaging results that were available during my care of the patient were reviewed by me and considered in my  medical decision making (see chart for details).  Review of the [place] CSRS was performed in accordance of the [place] prior to dispensing any controlled drugs.           Patient's diagnosis is consistent with left-sided weakness and dizziness.  Patient presented to the emergency department complaining of significant dizziness and weakness to the left side.  Patient states that last night he went to bed and was completely his normal self.  He was able to ambulate without difficulty.  He woke up this morning stating that  he felt intoxicated.  He states that he felt so dizzy that he cannot stand straight or still.  Patient is also experiencing some left-sided weakness.  History of previous CVA in the lacunar region.  Patient is concern for similar issue today as the patient's complaints are similar to his previous CVA.  No headache, visual changes, chest pain, shortness of breath.  Neuro exam was reassuring with patient being neurologically intact with cranial nerve testing.  Slight pronator drift and slightly decreased grip strength on the left when compared with right.  Patient is unable to stand or walk in a straight line at this time.  Ongoing concern for CVA despite reassuring CT and labs.  Patient is well outside the TPA window as last known well is 23 hours from time of charting.  Patient will be admitted to the hospital service for further work-up.    ____________________________________________  FINAL CLINICAL IMPRESSION(S) / ED DIAGNOSES  Final diagnoses:  Dizziness  Left-sided weakness      NEW MEDICATIONS STARTED DURING THIS VISIT:  ED Discharge Orders    None          This chart was dictated using voice recognition software/Dragon. Despite best efforts to proofread, errors can occur which can change the meaning. Any change was purely unintentional.    Darletta Moll, PA-C 09/14/20 2145    Duffy Bruce, MD 09/15/20 [phone]

## 2020-09-14 NOTE — ED Notes (Signed)
Taken to CT.

## 2020-09-14 NOTE — H&P (Addendum)
History and Physical   Thomas Howard MEQ:[id number] DOB: 1960/09/25 DOA: 09/14/2020  PCP: Thomas Bush, MD  Outpatient Specialists: Thomas Howard Patient coming from: Home via [place]  I have personally briefly reviewed patient's old medical records in [place].  Chief Concern: Imbalance  HPI: Thomas Howard is a right-handed, 60 y.o. male with medical history significant for hypertension, non-insulin-dependent diabetes mellitus, hyperlipidemia, history of stroke, truncal obesity, tobacco use, obstructive sleep apnea.  He presents emergency department for chief concerns of imbalance.  He states that at approximately 9:30 AM on day of presentation, he felt imbalance when getting up.  He reports that holding had to hold on to things around to make it around the room. He had difficulty getting up from his chair. His spouse at bedside, Thomas Howard, helped him forward and he fell to his knees. He states that when he stands, he feels like he's going to fall to the side or backwards.   He states he has never felt this way before. He denies recent head trauma and LOC.  He denies bug bites.  He denies changes to his medications, diet.  He denies chest pain, shortness of breath, vision changes, double vision. He denies recent cruise or flight. He denies numbness. Spouse, endorsed slurring of speech that is still persistent depending on the words.   He endorses baseline loose/water stool. He takes metformin.   Thomas Howard is compliant with plavix. He does not take a daily aspirin.   At bedside, patient had markedly decreased strength both squeezing and flexion extension of the left upper extremity compared to the right.  Social History: lives at home with spouse. He endorses tobacco use, peak of 2 ppd, currently 1 ppd. He infrequently drinks etoh, last drink was February. He denies recreatioanl drug use. 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   Vaccinations: he is not vaccinated for covid  19  ROS: Constitutional: no weight change, no fever ENT/Mouth: no sore throat, no rhinorrhea Eyes: no eye pain, no vision changes Cardiovascular: no chest pain, no dyspnea,  no edema, no palpitations Respiratory: no cough, no sputum, no wheezing Gastrointestinal: no nausea, no vomiting, no diarrhea, no constipation Genitourinary: no urinary incontinence, no dysuria, no hematuria Musculoskeletal: no arthralgias, no myalgias Skin: no skin lesions, no pruritus, Neuro: + weakness, no loss of consciousness, no syncope Psych: no anxiety, no depression, + decrease appetite Heme/Lymph: no bruising, no bleeding  ED Course: Discussed with ED provider, patient required hospitalization for chief concerns of imbalance suspicious for stroke.  Vitals in the emergency department was remarkable for temperature of 98.3, respiration rate of 17, blood pressure 168/98, SPO2 of 97% on room air, heart rate of 98.  Labs in the emergency department was remarkable for sodium 133, potassium 3.6, chloride 99, bicarb 25, BUN 13, serum creatinine of 1.12, nonfasting blood glucose 262.  CBC was within normal limits.  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  No acute abnormality noted - A.m. team to consult neurology - Complete echo ordered - MRI of the brain ordered - MRA of the head  with and without contrast ordered - Bilateral ultrasound of the carotid ordered due to contrast shortage - Fasting lipid and A1c ordered - Permissive hypertension  - Labetalol 5 mg IV every 2 hours as needed for SBP greater than 180 - Frequent neuro vascular checks - heart healthy/carb modified - PT, OT, SLP - Fall precaution  - Resumed home Plavix 75 mg daily - I ordered aspirin 324 mg once, and aspirin 81 mg daily starting 09/15/2020  Tobacco use-patient is not ready to quit and does not want to hear about it  Hypertension-elevated - Patient took his a.m. medications are ready - Home lisinopril 40 mg daily, hydrochlorothiazide 25 mg daily amlodipine 10 mg daily has not been resumed  Hyperlipidemia-atorvastatin 80 mg nightly resumed  Non-insulin-dependent diabetes mellitus-metformin 500 mg twice daily resumed - Empagliflozin 25 mg daily, not on formulary -Insulin sliding scale, at bedtime coverage ordered  Obstructive sleep apnea-CPAP nightly ordered  A.m. labs: BMP, CBC, B12, fasting lipid panel, A1c  chart reviewed.   DVT prophylaxis: Enoxaparin weight adjusted every 24 hours subcutaneous Code Status: full code Diet: Heart healthy/carb modified Family Communication: updated spouse at bedside  Disposition Plan: Pending clinical course Consults called: None at this time, a.m. team to consult neurology Admission status: MedSurg, observation, with telemetry  Past Medical History:  Diagnosis Date  . Diabetes mellitus without complication ([place])   . Gout 05/12/2018   Endorses longstanding dx  . History of smoking   . HTN (hypertension)   . Hyperlipemia   . Lacunar stroke of right subthalamic region ([place]) 11/30/2017  .  Obesity   . Sleep apnea   . Stroke ([place])   . Wears partial dentures    Past Surgical History:  Procedure Laterality Date  . MOUTH SURGERY    . WISDOM TOOTH EXTRACTION     Social History:  reports that he has been smoking cigarettes. He has a 20.00 pack-year smoking history. He has never used smokeless tobacco. He reports current alcohol use of about 1.0 standard drink of alcohol per week. He reports that he does not use drugs.  No Known Allergies Family History  Problem Relation Age of Onset  . Diabetes Mother   . Hypertension Mother   . Diabetes Maternal Grandmother   . Hypertension Maternal Grandmother   . Coronary artery disease Neg Hx   . Stroke Neg Hx   . Cancer Neg Hx    Family history: Family history reviewed and not pertinent  Prior to Admission medications   Medication Sig Start Date End Date Taking? Authorizing Provider  amLODipine (NORVASC) 10 MG tablet Take 1 tablet (10 mg total) by mouth daily. 12/06/19   Thomas Bush, MD  atorvastatin (LIPITOR) 80 MG tablet Take 1 tablet (80 mg total) by mouth daily. 12/06/19   Thomas Bush, MD  Blood Glucose Monitoring Suppl (ONE TOUCH ULTRA 2) w/Device KIT Use as directed E11.49 12/14/17   Thomas Bush, MD  clopidogrel (PLAVIX) 75 MG tablet Take 1 tablet (75 mg total) by mouth daily. 12/06/19   Thomas Bush, MD  colchicine  0.6 MG tablet Take 1 tablet (0.6 mg total) by mouth daily as needed (gout flare). Patient not taking: Reported on 12/06/2019 05/12/18   Thomas Bush, MD  empagliflozin (JARDIANCE) 25 MG TABS tablet Take 25 mg by mouth daily.    [provider]  glucose blood (ONE TOUCH ULTRA TEST) test strip Use as instructed to check sugars BID E11.49 12/14/17   Thomas Bush, MD  hydrochlorothiazide (HYDRODIURIL) 50 MG tablet Take 0.5 tablets (25 mg total) by mouth daily. 12/06/19   Thomas Bush, MD  Lancets [facility] ULTRASOFT) lancets Use as instructed to check sugars BID E11.49 12/14/17    Thomas Bush, MD  lisinopril (ZESTRIL) 40 MG tablet Take 1 tablet (40 mg total) by mouth daily. 12/06/19   Thomas Bush, MD  metFORMIN (GLUCOPHAGE XR) 750 MG 24 hr tablet Take 1 tablet (750 mg total) by mouth daily with breakfast for 7 days, THEN 2 tablets (1,500 mg total) daily with breakfast. 12/06/19 01/12/20  Thomas Bush, MD  potassium chloride (KLOR-CON) 10 MEQ tablet Take 1 tablet (10 mEq total) by mouth daily. 12/06/19   Thomas Bush, MD   Physical Exam: Vitals:   09/14/20 1815 09/14/20 2037 09/14/20 2130  BP: (!) 176/93 (!) 170/87 (!) 168/98  Pulse: 98 95 92  Resp: _0 Temp: 98.3 F (36.8 C)    TempSrc: Oral    SpO2: 99% 98% 98%  Weight: 104.3 kg    Height: _1  (1.702 m)     Constitutional: appears older than chronological age, NAD, calm, comfortable Eyes: PERRL, lids and conjunctivae normal ENMT: Mucous membranes are moist. Posterior pharynx clear of any exudate or lesions. Multiple teeth loss.  Hearing appropriate Neck: normal, supple, no masses, no thyromegaly Respiratory: clear to auscultation bilaterally, no wheezing, no crackles. Normal respiratory effort. No accessory muscle use.  Cardiovascular: Regular rate and rhythm, no murmurs / rubs / gallops. No extremity edema. 2+ pedal pulses. No carotid bruits.  Abdomen: Obese abdomen, no tenderness, no masses palpated, no hepatosplenomegaly. Bowel sounds positive.  Musculoskeletal: no clubbing / cyanosis. No joint deformity upper and lower extremities. Good ROM, no contractures, no atrophy. Normal muscle tone.  Skin: no rashes, lesions, ulcers. No induration Neurologic: Sensation intact.  Left upper extremity strength is markedly less than right.  Patient able to move spontaneously on his own passive and active movements.  Able to move against gravity. Psychiatric: Normal judgment and insight. Alert and oriented x 3. Normal mood.   EKG: independently reviewed, showing sinus rhythm with rate of 95, QTc  439  Chest x-ray on Admission: I personally reviewed and I agree with radiologist reading as below.  CT HEAD WO CONTRAST  Result Date: 09/14/2020 CLINICAL DATA:  Weakness and dizziness for several hours EXAM: CT HEAD WITHOUT CONTRAST TECHNIQUE: Contiguous axial images were obtained from the base of the skull through the vertex without intravenous contrast. COMPARISON:  None. FINDINGS: Brain: No evidence of acute infarction, hemorrhage, hydrocephalus, extra-axial collection or mass lesion/mass effect. Areas of prior lacunar infarcts are noted in the region of the anterior limb of the internal capsule on the right as well as the adjacent basal ganglia and deep white matter. Vascular: No hyperdense vessel or unexpected calcification. Skull: Normal. Negative for fracture or focal lesion. Sinuses/Orbits: No acute finding. Other: Arachnoid granulation is noted adjacent to the transverse sinus on the right best seen on sagittal image 14, coronal image 58 and axial image 11. IMPRESSION: Changes of prior infarct on the right as described.  No acute abnormality is noted. Electronically Signed   By: Inez Catalina M.D.   On: 09/14/2020 19:22   Labs on Admission: I have personally reviewed following labs  CBC: Recent Labs  Lab 09/14/20 1837  WBC 5.9  NEUTROABS 3.0  HGB 15.1  HCT 44.6  MCV 86.4  PLT 825   Basic Metabolic Panel: Recent Labs  Lab 09/14/20 1837  NA 133*  K 3.6  CL 99  CO2 25  GLUCOSE 262*  BUN 13  CREATININE 1.12  CALCIUM 9.1   GFR: Estimated Creatinine Clearance: 83.8 mL/min (by C-G formula based on SCr of 1.12 mg/dL).  Liver Function Tests: Recent Labs  Lab 09/14/20 1837  AST 21  ALT 28  ALKPHOS 95  BILITOT 0.8  PROT 8.0  ALBUMIN 4.2   Coagulation Profile: Recent Labs  Lab 09/14/20 1837  INR 1.0   Nautika Cressey N Karryn Kosinski D.O. [facility]  If 7PM-7AM, please contact overnight-coverage provider If 7AM-7PM, please contact day coverage  provider [email or web address]  09/14/2020, 10:40 PM

## 2020-09-14 NOTE — ED Triage Notes (Addendum)
At 9am patient woke up unsteady on feet but still able to ambulatePatient presents with left sided weakness that started around noon today. Patient unsteady and unable to ambulate without assistance at this time. Baseline needs no assistance ambulating. Speech clear. No facial droop. Left grip weaker than right. LKW 10pm 09/13/20. No vision changes. Previous stroke left patient with left eye, left sided peripheral vision loss

## 2020-09-14 NOTE — ED Notes (Signed)
Observed pt drinking grape soda in lobby.

## 2020-09-14 NOTE — Progress Notes (Signed)
PHARMACIST - PHYSICIAN COMMUNICATION  CONCERNING:  Enoxaparin (Lovenox) for DVT Prophylaxis    RECOMMENDATION: Patient was prescribed enoxaprin 40mg  q24 hours for VTE prophylaxis.   Filed Weights   09/14/20 1815  Weight: 104.3 kg (230 lb)    Body mass index is 36.02 kg/m.  Estimated Creatinine Clearance: 83.8 mL/min (by C-G formula based on SCr of 1.12 mg/dL).   Based on [facility] policy patient is candidate for enoxaparin 0.5mg /kg TBW SQ every 24 hours based on BMI being >30.  DESCRIPTION: Pharmacy has adjusted enoxaparin dose per [facility] policy.  Patient is now receiving enoxaparin 52.5 mg every 24 hours    [facility], PharmD Clinical Pharmacist  09/14/2020 9:50 PM

## 2020-09-15 DIAGNOSIS — I63421 Cerebral infarction due to embolism of right anterior cerebral artery: Secondary | ICD-10-CM

## 2020-09-15 DIAGNOSIS — I69354 Hemiplegia and hemiparesis following cerebral infarction affecting left non-dominant side: Secondary | ICD-10-CM

## 2020-09-15 DIAGNOSIS — I10 Essential (primary) hypertension: Secondary | ICD-10-CM | POA: Diagnosis not present

## 2020-09-15 DIAGNOSIS — R4781 Slurred speech: Secondary | ICD-10-CM | POA: Diagnosis present

## 2020-09-15 DIAGNOSIS — G4733 Obstructive sleep apnea (adult) (pediatric): Secondary | ICD-10-CM | POA: Diagnosis present

## 2020-09-15 DIAGNOSIS — Z6836 Body mass index (BMI) 36.0-36.9, adult: Secondary | ICD-10-CM | POA: Diagnosis not present

## 2020-09-15 DIAGNOSIS — R42 Dizziness and giddiness: Secondary | ICD-10-CM | POA: Diagnosis present

## 2020-09-15 DIAGNOSIS — E876 Hypokalemia: Secondary | ICD-10-CM | POA: Diagnosis present

## 2020-09-15 DIAGNOSIS — I63231 Cerebral infarction due to unspecified occlusion or stenosis of right carotid arteries: Secondary | ICD-10-CM | POA: Diagnosis present

## 2020-09-15 DIAGNOSIS — E1149 Type 2 diabetes mellitus with other diabetic neurological complication: Secondary | ICD-10-CM | POA: Diagnosis present

## 2020-09-15 DIAGNOSIS — E785 Hyperlipidemia, unspecified: Secondary | ICD-10-CM | POA: Diagnosis present

## 2020-09-15 DIAGNOSIS — I152 Hypertension secondary to endocrine disorders: Secondary | ICD-10-CM | POA: Diagnosis present

## 2020-09-15 DIAGNOSIS — E1169 Type 2 diabetes mellitus with other specified complication: Secondary | ICD-10-CM | POA: Diagnosis present

## 2020-09-15 DIAGNOSIS — I6930 Unspecified sequelae of cerebral infarction: Secondary | ICD-10-CM

## 2020-09-15 DIAGNOSIS — F321 Major depressive disorder, single episode, moderate: Secondary | ICD-10-CM | POA: Diagnosis present

## 2020-09-15 DIAGNOSIS — Z20822 Contact with and (suspected) exposure to covid-19: Secondary | ICD-10-CM | POA: Diagnosis present

## 2020-09-15 DIAGNOSIS — F17210 Nicotine dependence, cigarettes, uncomplicated: Secondary | ICD-10-CM | POA: Diagnosis present

## 2020-09-15 DIAGNOSIS — E1159 Type 2 diabetes mellitus with other circulatory complications: Secondary | ICD-10-CM | POA: Diagnosis not present

## 2020-09-15 DIAGNOSIS — Z28310 Unvaccinated for covid-19: Secondary | ICD-10-CM | POA: Diagnosis not present

## 2020-09-15 DIAGNOSIS — M109 Gout, unspecified: Secondary | ICD-10-CM | POA: Diagnosis present

## 2020-09-15 DIAGNOSIS — Z79899 Other long term (current) drug therapy: Secondary | ICD-10-CM | POA: Diagnosis not present

## 2020-09-15 DIAGNOSIS — Z7984 Long term (current) use of oral hypoglycemic drugs: Secondary | ICD-10-CM | POA: Diagnosis not present

## 2020-09-15 DIAGNOSIS — E1165 Type 2 diabetes mellitus with hyperglycemia: Secondary | ICD-10-CM | POA: Diagnosis present

## 2020-09-15 DIAGNOSIS — Z7902 Long term (current) use of antithrombotics/antiplatelets: Secondary | ICD-10-CM | POA: Diagnosis not present

## 2020-09-15 DIAGNOSIS — E7800 Pure hypercholesterolemia, unspecified: Secondary | ICD-10-CM | POA: Diagnosis present

## 2020-09-15 DIAGNOSIS — I6389 Other cerebral infarction: Secondary | ICD-10-CM | POA: Diagnosis not present

## 2020-09-15 DIAGNOSIS — R29701 NIHSS score 1: Secondary | ICD-10-CM | POA: Diagnosis present

## 2020-09-15 DIAGNOSIS — Z833 Family history of diabetes mellitus: Secondary | ICD-10-CM | POA: Diagnosis not present

## 2020-09-15 LAB — URINE DRUG SCREEN, QUALITATIVE (ARMC ONLY)
Amphetamines, Ur Screen: NOT DETECTED
Barbiturates, Ur Screen: NOT DETECTED
Benzodiazepine, Ur Scrn: NOT DETECTED
Cannabinoid 50 Ng, Ur ~~LOC~~: NOT DETECTED
Cocaine Metabolite,Ur ~~LOC~~: NOT DETECTED
MDMA (Ecstasy)Ur Screen: NOT DETECTED
Methadone Scn, Ur: NOT DETECTED
Opiate, Ur Screen: NOT DETECTED
Phencyclidine (PCP) Ur S: NOT DETECTED
Tricyclic, Ur Screen: NOT DETECTED

## 2020-09-15 LAB — URINALYSIS, ROUTINE W REFLEX MICROSCOPIC
Bilirubin Urine: NEGATIVE
Glucose, UA: NEGATIVE mg/dL
Hgb urine dipstick: NEGATIVE
Ketones, ur: NEGATIVE mg/dL
Leukocytes,Ua: NEGATIVE
Nitrite: NEGATIVE
Protein, ur: NEGATIVE mg/dL
Specific Gravity, Urine: 1.014 (ref 1.005–1.030)
pH: 5 (ref 5.0–8.0)

## 2020-09-15 LAB — BASIC METABOLIC PANEL
Anion gap: 10 (ref 5–15)
BUN: 13 mg/dL (ref 6–20)
CO2: 25 mmol/L (ref 22–32)
Calcium: 8.8 mg/dL — ABNORMAL LOW (ref 8.9–10.3)
Chloride: 100 mmol/L (ref 98–111)
Creatinine, Ser: 1.02 mg/dL (ref 0.61–1.24)
GFR, Estimated: >60 mL/min (ref >60)
Glucose, Bld: 203 mg/dL — ABNORMAL HIGH (ref 70–99)
Potassium: 3.2 mmol/L — ABNORMAL LOW (ref 3.5–5.1)
Sodium: 135 mmol/L (ref 135–145)

## 2020-09-15 LAB — CBC
HCT: 42.3 % (ref 39.0–52.0)
Hemoglobin: 14.5 g/dL (ref 13.0–17.0)
MCH: 29.3 pg (ref 26.0–34.0)
MCHC: 34.3 g/dL (ref 30.0–36.0)
MCV: 85.5 fL (ref 80.0–100.0)
Platelets: 244 10*3/uL (ref 150–400)
RBC: 4.95 MIL/uL (ref 4.22–5.81)
RDW: 12.7 % (ref 11.5–15.5)
WBC: 6 10*3/uL (ref 4.0–10.5)
nRBC: 0 % (ref 0.0–0.2)

## 2020-09-15 LAB — CBG MONITORING, ED: Glucose-Capillary: 211 mg/dL — ABNORMAL HIGH (ref 70–99)

## 2020-09-15 LAB — RESP PANEL BY RT-PCR (FLU A&B, COVID) ARPGX2
Influenza A by PCR: NEGATIVE
Influenza B by PCR: NEGATIVE
SARS Coronavirus 2 by RT PCR: NEGATIVE

## 2020-09-15 LAB — LIPID PANEL
Cholesterol: 203 mg/dL — ABNORMAL HIGH (ref 0–200)
HDL: 28 mg/dL — ABNORMAL LOW (ref >40)
LDL Cholesterol: 139 mg/dL — ABNORMAL HIGH (ref 0–99)
Total CHOL/HDL Ratio: 7.3 RATIO
Triglycerides: 180 mg/dL — ABNORMAL HIGH (ref <150)
VLDL: 36 mg/dL (ref 0–40)

## 2020-09-15 LAB — HEMOGLOBIN A1C
Hgb A1c MFr Bld: 9.8 % — ABNORMAL HIGH (ref 4.8–5.6)
Mean Plasma Glucose: 234.56 mg/dL

## 2020-09-15 LAB — GLUCOSE, CAPILLARY
Glucose-Capillary: 217 mg/dL — ABNORMAL HIGH (ref 70–99)
Glucose-Capillary: 331 mg/dL — ABNORMAL HIGH (ref 70–99)
Glucose-Capillary: 142 mg/dL — ABNORMAL HIGH (ref 70–99)
Glucose-Capillary: 157 mg/dL — ABNORMAL HIGH (ref 70–99)

## 2020-09-15 LAB — HIV ANTIBODY (ROUTINE TESTING W REFLEX): HIV Screen 4th Generation wRfx: NONREACTIVE

## 2020-09-15 MED ORDER — NICOTINE POLACRILEX 2 MG MT GUM
2.0000 mg | CHEWING_GUM | OROMUCOSAL | Status: DC | PRN
  Administered 2020-09-16: 09:00:00 2 mg via ORAL
  Filled 2020-09-15 (×2): qty 1

## 2020-09-15 MED ORDER — POTASSIUM CHLORIDE CRYS ER 20 MEQ PO TBCR
40.0000 meq | EXTENDED_RELEASE_TABLET | Freq: Once | ORAL | Status: AC
  Administered 2020-09-15: 40 meq via ORAL
  Filled 2020-09-15: qty 2

## 2020-09-15 NOTE — Evaluation (Signed)
Occupational Therapy Evaluation Patient Details Name: Thomas Howard MRN: [id number] DOB: 1961-03-26 Today's Date: 09/15/2020    History of Present Illness The pt is a pleasant 60 yo male who presented to ED on 09/14/2020 with c/o of imbalance, where pt had difficulty getting up and out of his chair and experienced a fall to his knees. Per MRI brain: Multifocal acute ischemia within the medial right hemisphere. Per chart PMH significant for hypertension, non-insulin-dependent diabetes mellitus, hyperlipidemia, history of stroke (affecting L side), truncal obesity, tobacco use, obstructive sleep apnea.   Clinical Impression   Pt seen for OT evaluation this date in setting of acute hospitalization d/t c/o decreased balance d/t ischemia in R side of brain as noted on MRI. Pt presents this date reporting that he feels most of his symptoms have resolved and the slight weakness/decreased coordination present in the L UE/LE on assessment are his reported baseline from the remote previous stroke. Pt presents this date with G dynamic balance demonstrating ability to accept a challenge w/o AD. While he does lose his balance posteriorly or to the L d/t weakness, he is able to compensate and self correct w/o assist from the OT. Pt does report some baseline difficulties with LB ADLs that he reports is d/t L LE weakness, but also body habitus in the abdominal region. Pt demos ability to perform LB ADLs w/o assist although it is a struggle and OT educates re: core exercises such as contralateral reaching, lateral leans in sitting and modified chair sit-ups. Pt demos good understanding and carryover. DO not anticipate further acute OT needs at this time. Will complete order and do not anticipate need for OT f/u outside of hospital stay.     Follow Up Recommendations  No OT follow up    Equipment Recommendations  None recommended by OT    Recommendations for Other Services       Precautions / Restrictions  Precautions Precautions: Fall Restrictions Weight Bearing Restrictions: No      Mobility Bed Mobility Overal bed mobility: Modified Independent             General bed mobility comments: uses bedrail to assist with rolling, supine>sit at EOB    Transfers Overall transfer level: Modified independent Equipment used: Rolling walker (2 wheeled)             General transfer comment: supervision for sit<>stand. VC for hand placement/safe technique, scooting to EOB so feet are on floor.    Balance Overall balance assessment: Modified Independent;Mild deficits observed, not formally tested Sitting-balance support: Single extremity supported Sitting balance-Leahy Scale: Good     Standing balance support: No upper extremity supported (No UE support, however, with close CGA) Standing balance-Leahy Scale: Fair Standing balance comment: some posterolateral LOB when challneged, but he is able to catch himself. Demos abiltiy to get onto and off of the floor on his knees which he learned in OP PT and he also demos single leg stance which is less successful on his weaker L LE, but he is ultmately, still able to control and catch and correct himself. Single Leg Stance - Right Leg: 15 Single Leg Stance - Left Leg: 0 (unable to maintain without toe-touch)                       ADL either performed or assessed with clinical judgement   ADL Overall ADL's : Modified independent;At baseline  General ADL Comments: does requires increased time and struggle to perform LB ADLs, but he is ultimately able to don his socks himself and has wife help at home d/t "laziness". Pt is in agreement that he needs to work on maintaining his LB ROM, flexibility and strength and is willing to trial stretches demo'ed by OT at home.     Vision Patient Visual Report: No change from baseline Additional Comments: pt with peripheral vision deficits  and some decreased depth perception on L side at baseline that he reports resulted after initial stroke.     Perception     Praxis      Pertinent Vitals/Pain Pain Assessment: No/denies pain     Hand Dominance     Extremity/Trunk Assessment Upper Extremity Assessment Upper Extremity Assessment: RUE deficits/detail;LUE deficits/detail RUE Deficits / Details: ROM WFL, MMT groslsy 4/5 LUE Deficits / Details: some decreased strength and coordination at baseline from h/o previous stroke, but underwent OPOT and has rehabilitated most [facility] skills to L hand LUE Sensation: WNL LUE Coordination: decreased fine motor   Lower Extremity Assessment Lower Extremity Assessment: Defer to PT evaluation;RLE deficits/detail;LLE deficits/detail RLE Deficits / Details: WFL LLE Deficits / Details: slightly weaker L LE versus R which pt reports is baseline d/t h/o stoke. Hip rotation and flexion limited for LB ADLs. LLE Sensation: WNL   Cervical / Trunk Assessment Cervical / Trunk Assessment: Normal   Communication Communication Communication: No difficulties   Cognition Arousal/Alertness: Awake/alert Behavior During Therapy: WFL for tasks assessed/performed Overall Cognitive Status: Within Functional Limits for tasks assessed                                     General Comments  Pt impaired SLB on LLE.    Exercises Other Exercises Other Exercises: Ed re: core control exercises for balance and general health including improving ease of LB ADLs.   Shoulder Instructions      Home Living Family/patient expects to be discharged to:: Private residence Living Arrangements: Spouse/significant other;Children Available Help at Discharge: Family Type of Home: Other(Comment) (townhouse) Home Access: Level entry     Home Layout: Two level Alternate Level Stairs-Number of Steps: 14 Alternate Level Stairs-Rails: Right;Left Bathroom Shower/Tub: Tub/shower unit (second floor)    Bathroom Toilet: Standard     Home Equipment: None   Additional Comments: Pt reports only has toilet on first floor, tub/shower on second floor. Bedroom on second floor. However, pt says he is able to sleep on first level.      Prior Functioning/Environment Level of Independence: Independent  Gait / Transfers Assistance Needed: no AD for fxl mobility in the home or community. reports walking the dog QD ADL's / Homemaking Assistance Needed: was requiring some assist from spouse to don socks, but otherwise INDEP with self care   Comments: History of previous stroke. Pt reports indep. with majority of ADLs as PLOF, but difficulty performing job duties due to decreased strength/balance from prior stroke.        OT Problem List: Decreased strength;Decreased coordination;Obesity      OT Treatment/Interventions: Self-care/ADL training;Therapeutic activities    OT Goals(Current goals can be found in the care plan section) Acute Rehab OT Goals Patient Stated Goal: Pt would like to improve strength, balance of L side OT Goal Formulation: All assessment and education complete, DC therapy  OT Frequency:     Barriers to D/C:  Co-evaluation              AM-PAC OT "6 Clicks" Daily Activity     Outcome Measure Help from another person eating meals?: None Help from another person taking care of personal grooming?: None Help from another person toileting, which includes using toliet, bedpan, or urinal?: None Help from another person bathing (including washing, rinsing, drying)?: None Help from another person to put on and taking off regular upper body clothing?: None Help from another person to put on and taking off regular lower body clothing?: A Little 6 Click Score: 23   End of Session Nurse Communication: Mobility status  Activity Tolerance: Patient tolerated treatment well Patient left: Other (comment) (sitting EOB, spouse present, pt bed alarm off as he demos good  balance, able to freely walk around facility.)  OT Visit Diagnosis: Muscle weakness (generalized) (M62.81)                Time: 9390-3009 OT Time Calculation (min): 44 min Charges:  OT General Charges $OT Visit: 1 Visit OT Evaluation $OT Eval Low Complexity: 1 Low OT Treatments $Self Care/Home Management : 8-22 mins $Therapeutic Activity: 8-22 mins $Therapeutic Exercise: 8-22 mins  Rejeana Brock, MS, OTR/L ascom [phone] 09/15/20, 5:38 PM

## 2020-09-15 NOTE — Progress Notes (Signed)
Pt admitted to 129 from ED this AM. No c/o pain or SOB. Oriented to self, at baseline. Skin CDI, healed scar to right knee. NSR on telemetry. BP elevated but below requirements for PRN labetolol. Oriented to room and call bell. Ambulated to bathroom with use of walker, gait much improved. Wife at bedside, all questions/concerns addressed. Fall/safety precautions initiated. Rounding performed.

## 2020-09-15 NOTE — Consult Note (Signed)
Neurology Consultation Reason for Consult: Stroke Requesting Physician: Eppie Gibson  CC: Left leg weakness and imbalance  History is obtained from: Patient and chart review  HPI: Thomas Howard is a 60 y.o. male with a past medical history significant for prior right thalamic stroke with residual mild left-sided weakness, uncontrolled diabetes (A1c 9.8%), uncontrolled hypertension, uncontrolled hyperlipidemia, obstructive sleep apnea nonadherent to CPAP, obesity, ongoing tobacco abuse (down from 2 packs a day to 1 pack/day), gout.  He reports that he woke up on 5/7 he had severe imbalance and difficulty using his left side, increased from his baseline.  This has gradually been improving.  His wife additionally noticed some slurred speech which has also improved.  He notes he is cutting back on his smoking.  In an attempt to control his sugar better he replaced drinking soda with drinking tea with honey.  He does not use his CPAP because the mask is uncomfortable.  LKW: 5/6 at bedtime tPA given?: No, due to out of the window Premorbid modified rankin scale:      1 - No significant disability. Able to carry out all usual activities, despite some symptoms.  ROS: All other review of systems was negative except as noted in the HPI.  Past Medical History:  Diagnosis Date  . Diabetes mellitus without complication ([place])   . Gout 05/12/2018   Endorses longstanding dx  . History of smoking   . HTN (hypertension)   . Hyperlipemia   . Lacunar stroke of right subthalamic region ([place]) 11/30/2017  . Obesity   . Sleep apnea   . Stroke ([place])   . Wears partial dentures    Past Surgical History:  Procedure Laterality Date  . MOUTH SURGERY    . WISDOM TOOTH EXTRACTION     Current Outpatient Medications  Medication Instructions  . amLODipine (NORVASC) 10 mg, Oral, Daily  . atorvastatin (LIPITOR) 80 mg, Oral, Daily  . Blood Glucose Monitoring Suppl (ONE TOUCH ULTRA 2) w/Device KIT Use as  directed E11.49  . clopidogrel (PLAVIX) 75 mg, Oral, Daily  . colchicine 0.6 mg, Oral, Daily PRN  . empagliflozin (JARDIANCE) 25 mg, Oral, Daily  . glucose blood (ONE TOUCH ULTRA TEST) test strip Use as instructed to check sugars BID E11.49  . hydrochlorothiazide (HYDRODIURIL) 25 mg, Oral, Daily  . Lancets (ONETOUCH ULTRASOFT) lancets Use as instructed to check sugars BID E11.49  . lisinopril (ZESTRIL) 40 mg, Oral, Daily  . metFORMIN (GLUCOPHAGE XR) 750 MG 24 hr tablet Take 1 tablet (750 mg total) by mouth daily with breakfast for 7 days, THEN 2 tablets (1,500 mg total) daily with breakfast.  . metFORMIN (GLUCOPHAGE) 500 mg, Oral, 2 times daily with meals  . potassium chloride (KLOR-CON) 10 MEQ tablet 10 mEq, Oral, Daily    Family History  Problem Relation Age of Onset  . Diabetes Mother   . Hypertension Mother   . Diabetes Maternal Grandmother   . Hypertension Maternal Grandmother   . Coronary artery disease Neg Hx   . Stroke Neg Hx   . Cancer Neg Hx    Social History:  reports that he has been smoking cigarettes. He has a 20.00 pack-year smoking history. He has never used smokeless tobacco. He reports current alcohol use of about 1.0 standard drink of alcohol per week. He reports that he does not use drugs.  His hobby is rifle shooting.  He is a retired Scientist, research (life sciences)   Exam: Current vital signs: BP (!) 171/92 (BP Location: Left Arm)  Pulse 80   Temp 98.2 F (36.8 C) (Oral)   Resp 17   Ht 5' 7"  (1.702 m)   Wt 104.3 kg   SpO2 96%   BMI 36.02 kg/m  Vital signs in last 24 hours: Temp:  [98 F (36.7 C)-98.7 F (37.1 C)] 98.2 F (36.8 C) (05/08 0400) Pulse Rate:  [73-98] 80 (05/08 0458) Resp:  [16-23] 17 (05/08 0611) BP: (160-201)/(80-98) 171/92 (05/08 0611) SpO2:  [92 %-99 %] 96 % (05/08 0611) Weight:  [104.3 kg] 104.3 kg (05/07 1815)   Physical Exam  Constitutional: Appears well-developed and well-nourished.  Obese Psych: Affect appropriate to situation, calm and  cooperative Eyes: No scleral injection HENT: No oropharyngeal obstruction.  MSK: no joint deformities.  Cardiovascular: Normal rate and regular rhythm.  Respiratory: Effort normal, non-labored breathing GI: Soft.  No distension. There is no tenderness.  Skin: Warm dry and intact visible skin  Neuro: Mental Status: Patient is awake, alert, oriented to person, place, month, year, and situation. Patient is able to give a clear and coherent history. No signs of aphasia or neglect Cranial Nerves: II: Visual Fields are full. Pupils are equal, round, and reactive to light, though bilateral cataracts are visible.   III,IV, VI: EOMI without ptosis or diploplia.  V: Facial sensation is symmetric to light touch VII: Facial movement is symmetric.  VIII: hearing is intact to voice X: Uvula elevates symmetrically XI: Shoulder shrug is reduced on the left side (baseline). XII: tongue is midline without atrophy or fasciculations.  Motor: Tone is normal. Bulk is normal. 5/5 strength was present in all four extremities except for mild left wrist flexion (4) and finger extension (4) weakness, and hip flexion (4 -) and knee flexion (4 -) weakness.  Sensory: Sensation is symmetric to light touch and temperature in the arms and legs. Deep Tendon Reflexes: 2+ and symmetric in the biceps and patellae.  Cerebellar: Mild dysmetria at end reach with the right upper extremity.  Slower but intact in the left arm and leg.  Intact in the right lower extremity  NIHSS total 1 Score breakdown: 1 point for left leg weakness    I have reviewed labs in epic and the results pertinent to this consultation are:   Basic Metabolic Panel: Recent Labs  Lab 09/14/20 1837 09/15/20 0505  NA 133* 135  K 3.6 3.2*  CL 99 100  CO2 25 25  GLUCOSE 262* 203*  BUN 13 13  CREATININE 1.12 1.02  CALCIUM 9.1 8.8*    CBC: Recent Labs  Lab 09/14/20 1837 09/15/20 0505  WBC 5.9 6.0  NEUTROABS 3.0  --   HGB 15.1 14.5   HCT 44.6 42.3  MCV 86.4 85.5  PLT 242 244    Coagulation Studies: Recent Labs    09/14/20 1837  LABPROT 13.5  INR 1.0    Lab Results  Component Value Date   HGBA1C 9.0 (H) 11/29/2019    Lab Results  Component Value Date   HGBA1C 9.8 (H) 09/15/2020     Lab Results  Component Value Date   CHOL 203 (H) 09/15/2020   HDL 28 (L) 09/15/2020   LDLCALC 139 (H) 09/15/2020   TRIG 180 (H) 09/15/2020   CHOLHDL 7.3 09/15/2020   UA and UDS negative  I have reviewed the images obtained:  MRI brain personally reviewed, scattered punctate strokes in the right ACA/MCA watershed territory MRI personally reviewed, atherosclerotic irregularity of the right ACA, which could be the culprit stenosis for strokes Carotid ultrasounds  negative for hemodynamically significant stenosis (less than 50% stenosis bilaterally) Echocardiogram pending   Impression: This is a 60 year old male with a past medical history significant for stroke risk factors of uncontrolled hypercholesterolemia (LDL 139), uncontrolled diabetes (last A1c 9% in July 2021, repeat here pending), uncontrolled hypertension this admission, ongoing smoking, obstructive sleep apnea nonadherent to CPAP.  His significant uncontrolled vascular risk factors are the underlying etiology of his intracranial stenosis leading to this stroke, though a cardioembolic source cannot be ruled out and therefore ECHO should be completed.  Recommendations:  #Right ACA territory strokes secondary to right ACA stenosis -Dual antiplatelet therapy for 90 days based on right ACA stenosis if no indication for anticoagulation found on echocardiogram  -Continue home Plavix 75 mg indefinitely  -Aspirin 325 mg load followed by 81 mg daily for 90 days -Risk factor modification counseling: smoking cessation, diet, exercise, medication adherence, CPAP compliance, weight loss.  Extensively discussed with patient today by myself. -Blood pressure goal: gradual  progression to normotension, agents per primary team/PCP -Outpatient neurology follow-up  -Ambulatory referral to lipid clinic placed given uncontrolled hyperlipidemia despite maximal dose of atorvastatin -Consider referral to hypertension clinic -Consider referral to nutritionist -Neurology will follow up echocardiogram and reach out if any adjustments to the above plan are needed, otherwise neurology will be available on an as-needed basis going forward.  Please reach out if additional questions arise.  Lesleigh Noe MD-PhD [facility] [phone] [facility] coverage for [facility] is from 8 AM to 4 AM in-house and 4 PM to 8 PM by telephone/video. 8 PM to 8 AM emergent questions or overnight urgent questions should be addressed to Teleneurology On-call or [facility] neurohospitalist; contact information can be found on AMION

## 2020-09-15 NOTE — ED Notes (Addendum)
Late entry - Pt returned from MRI - pt lying in bed awake and alert; oriented to person only - spouse at bedside reports this is patient baseline since prior CVA 2019 - pt also has had ongoing vision deficits since 2019 CVA.  Pt denies pain, denies numbness/tingling - continues to have ongoing weakness to LUE and LLE.  RR even and unlabored on RA with symmetrical rise and fall of chest.  Continuous cardiac and pulse ox monitoring maintained.   NSR with ST depression on cardiac monitoring - O2 sats on RA 92-94%

## 2020-09-15 NOTE — Evaluation (Signed)
Physical Therapy Evaluation Patient Details Name: Thomas Howard MRN: [id number] DOB: 01/01/1961 Today's Date: 09/15/2020   History of Present Illness  The pt is a pleasant 60 yo male who presented to ED on 09/14/2020 with c/o of imbalance, where pt had difficulty getting up and out of his chair and experienced a fall to his knees. Per MRI brain, pt with CVA of medial right hemisphere with L sided weakness. Per chart PMH significant for hypertension, non-insulin-dependent diabetes mellitus, hyperlipidemia, history of stroke, truncal obesity, tobacco use, obstructive sleep apnea.  Clinical Impression  Pt presents to PT evaluation with the above. Pt supine in bed, his family present. He reports he has already been up in his room today. Pt is observed to be mod I with all bed mobility. Sensation is WFL. He does exhibit LLE strength deficits compared to RLE. Pt observed to be able to lift RW, exhibiting WFL UE strength. The pt is able to ambulate approx. 110 feet with RW, CGA, but does report fatigue (denies dizziness, SOB). He ascended/descended 4 steps using B handrails and step-to technique. Pt has good seated balance, fair standing balance, and poor SLB on LLE. He lives with his family in [place] townhouse with level entry. However, his bed and tub/shower are on the second floor (14 steps with B handrails). He reports he is able to accommodate to sleep on first floor. Pt requires further stair practice and gait training. The pt would benefit from further skilled therapy at this time to address, stairs, balance and gait impairments sot he pt can safely navigate his home.     Follow Up Recommendations Outpatient PT    Equipment Recommendations  Rolling walker with 5" wheels    Recommendations for Other Services       Precautions / Restrictions Precautions Precautions: Fall Restrictions Weight Bearing Restrictions: No      Mobility  Bed Mobility Overal bed mobility: Modified Independent              General bed mobility comments: uses bedrail to assist with rolling, supine>sit at EOB    Transfers Overall transfer level: Modified independent Equipment used: Rolling walker (2 wheeled)             General transfer comment: supervision for sit<>stand. VC for hand placement/safe technique, scooting to EOB so feet are on floor.  Ambulation/Gait Ambulation/Gait assistance: Supervision Gait Distance (Feet): 110 Feet Assistive device: Rolling walker (2 wheeled) Gait Pattern/deviations: Step-through pattern     General Gait Details: Step-through pattern with RW and CGA. No decreases in postural stability or LOB noted. Pt did report fatigue with gait, indicating decreased endurance. Pt denies dizziness with gait.  Stairs Stairs: Yes Stairs assistance: Min guard Stair Management: Two rails;Step to pattern Number of Stairs: 4 General stair comments: PT instructed pt in safe technique with stairs, cuing for hand/foot placement ascending/descending. Pt exhibits good technique with cuing.  Wheelchair Mobility    Modified Rankin (Stroke Patients Only)       Balance Overall balance assessment: Needs assistance;History of Falls (see below for SLB) Sitting-balance support: Single extremity supported Sitting balance-Leahy Scale: Good     Standing balance support: No upper extremity supported (No UE support, however, with close CGA) Standing balance-Leahy Scale: Fair Standing balance comment: Standing balance w/o UE support assessed. However, PT provided close CGA. No decrease in postural stability noted, as pt tested standing balance for approx 20 sec Single Leg Stance - Right Leg: 15 Single Leg Stance - Left Leg: 0 (unable  to maintain without toe-touch)                         Pertinent Vitals/Pain Pain Assessment: No/denies pain    Home Living Family/patient expects to be discharged to:: Private residence (town house) Living Arrangements:  Spouse/significant other;Children Available Help at Discharge: Family Type of Home:  (town house) Home Access: Level entry     Home Layout: Two level Home Equipment: None Additional Comments: Pt reports only has toilet on first floor, tub/shower on second floor. Bedroom on second floor. However, pt says he is able to sleep on first level.    Prior Function Level of Independence: Independent;Needs assistance      ADL's / Homemaking Assistance Needed: PLOF independent  Comments: History of previous stroke. Pt reports indep. with majority of ADLs as PLOF, but difficulty performing job duties due to decreased strength/balance from prior stroke.     Hand Dominance        Extremity/Trunk Assessment   Upper Extremity Assessment Upper Extremity Assessment: Overall WFL for tasks assessed    Lower Extremity Assessment Lower Extremity Assessment: LLE deficits/detail LLE Deficits / Details: LLE grossly 4+/5 compared to RLE which is grossly 5/5. Greatest deficits in L hip flexors, abductors, knee flexors LLE Sensation: WNL    Cervical / Trunk Assessment Cervical / Trunk Assessment: Normal  Communication   Communication: No difficulties  Cognition Arousal/Alertness: Awake/alert Behavior During Therapy: WFL for tasks assessed/performed Overall Cognitive Status: Within Functional Limits for tasks assessed                                        General Comments General comments (skin integrity, edema, etc.): Pt impaired SLB on LLE.    Exercises Other Exercises Other Exercises: PT instructed pt in exercises he can perform while in bed, such as ankle pumps, LE marches, heel slides. PT instructed pt in use of RW/technique with gait, monitoring for SOB/dizzines with activities, and instructed pt in safe technique with stairs   Assessment/Plan    PT Assessment Patient needs continued PT services  PT Problem List Decreased strength;Decreased mobility;Decreased activity  tolerance;Decreased balance       PT Treatment Interventions DME instruction;Gait training;Stair training;Functional mobility training;Therapeutic activities;Therapeutic exercise;Balance training;Neuromuscular re-education;Patient/family education    PT Goals (Current goals can be found in the Care Plan section)  Acute Rehab PT Goals Patient Stated Goal: Pt would like to improve strength, balance of L side PT Goal Formulation: With patient Time For Goal Achievement: 09/29/20 Potential to Achieve Goals: Good    Frequency Min 2X/week   Barriers to discharge        Co-evaluation               AM-PAC PT "6 Clicks" Mobility  Outcome Measure Help needed turning from your back to your side while in a flat bed without using bedrails?: A Little Help needed moving from lying on your back to sitting on the side of a flat bed without using bedrails?: A Little Help needed moving to and from a bed to a chair (including a wheelchair)?: A Little (supervision) Help needed standing up from a chair using your arms (e.g., wheelchair or bedside chair)?: None Help needed to walk in hospital room?: A Little Help needed climbing 3-5 steps with a railing? : A Little 6 Click Score: 19    End of Session  Equipment Utilized During Treatment: Gait belt Activity Tolerance: Patient tolerated treatment well (pt reports fatigue after activity) Patient left: in bed;with call bell/phone within reach;with family/visitor present Nurse Communication: Mobility status PT Visit Diagnosis: Unsteadiness on feet (R26.81);History of falling (Z91.81);Other abnormalities of gait and mobility (R26.89);Muscle weakness (generalized) (M62.81)    Time: 9021-1155 PT Time Calculation (min) (ACUTE ONLY): 34 min   Charges:   PT Evaluation $PT Eval Low Complexity: 1 Low PT Treatments $Gait Training: 8-22 mins $Therapeutic Activity: 8-22 mins        Temple Pacini PT, DPT 09/15/2020, 4:34 PM

## 2020-09-15 NOTE — Progress Notes (Addendum)
PROGRESS NOTE    Thomas Howard  ZHG:[id number] DOB: [birth year] DOA: 09/14/2020 PCP: Eustaquio BoydenGutierrez, Javier, MD    Assessment & Plan:   Principal Problem:   CVA (cerebral vascular accident) [facility](HCC) Active Problems:   Hypertension associated with diabetes (HCC)   Severe obesity (BMI 35.0-39.9) with comorbidity (HCC)   History of stroke with residual deficit   OSA (obstructive sleep apnea)   Smoker   Occlusion and stenosis of left anterior cerebral artery   Uncontrolled type 2 diabetes mellitus with neurologic complication, without long-term current use of insulin (HCC)   Hemiplegia and hemiparesis following cerebral infarction affecting left non-dominant side (HCC)   Vertigo   MDD (major depressive disorder), single episode, moderate (HCC)   CVA: of medial right hemisphere as per MRI brain. Echo ordered. B/l carotid US ordered. Allow permissive HTN. IV labetalol prn. Continue w/ neuro checks. Continue on home dose of plavix and started on aspirin  Smoker: smoking cessation counseling. Nicotine gum to prevent w/drawals  HTN: continue to hold lisinopril, HCTZ, amlodipine  DM2: likely poorly controlled. Will hold home dose of metformin. Continue on SSI w/ accuchecks  Hypokalemia: KCl repleated. Will continue to monitor   OSA: CPAP qhs  Obesity: BMI 36.0. Complicates overall care and prognosis    DVT prophylaxis: lovenox  Code Status: full  Family Communication: discussed pt's care w/ pt's family at bedside and answered their questions  Disposition Plan: depends on PT/OT recs.  Level of care: Med-Surg   Status is: Observation  The patient remains OBS appropriate and will d/c before 2 midnights.  Dispo: The patient is from: Home              Anticipated d/c is to: Home              Patient currently is not medically stable to d/c.   Difficult to place patient: unclear    Consultants:   Neuro    Procedures:    Antimicrobials:    Subjective: Pt c/o left leg & arm  weakness  Objective: Vitals:   09/15/20 0230 09/15/20 0400 09/15/20 0458 09/15/20 0611  BP: (!) 160/83 (!) 183/97 (!) 174/80 (!) 171/92  Pulse: 81 82 80   Resp: (!) 23 (!) 21  17  Temp:  98.2 F (36.8 C)    TempSrc:  Oral    SpO2: 92% 96%  96%  Weight:      Height:        Intake/Output Summary (Last 24 hours) at 09/15/2020 0723 Last data filed at 09/15/2020 0400 Gross per 24 hour  Intake 257.99 ml  Output --  Net 257.99 ml   Filed Weights   09/14/20 1815  Weight: 104.3 kg    Examination:  General exam: Appears calm and comfortable. Obese Respiratory system: Clear to auscultation. Respiratory effort normal. Cardiovascular system: S1 & S2+. No rubs, gallops or clicks.  Gastrointestinal system: Abdomen is obese, soft and nontender. Normal bowel sounds heard. Central nervous system: Alert and oriented. Left leg and arm weakness Psychiatry: Judgement and insight appear normal. Mood & affect appropriate.     Data Reviewed: I have personally reviewed following labs and imaging studies  CBC: Recent Labs  Lab 09/14/20 1837 09/15/20 0505  WBC 5.9 6.0  NEUTROABS 3.0  --   HGB 15.1 14.5  HCT 44.6 42.3  MCV 86.4 85.5  PLT 242 244   Basic Metabolic Panel: Recent Labs  Lab 09/14/20 1837 09/15/20 0505  NA 133* 135  K 3.6 3.2*  CL 99 100  CO2 25 25  GLUCOSE 262* 203*  BUN 13 13  CREATININE 1.12 1.02  CALCIUM 9.1 8.8*   GFR: Estimated Creatinine Clearance: 92 mL/min (by C-G formula based on SCr of 1.02 mg/dL). Liver Function Tests: Recent Labs  Lab 09/14/20 1837  AST 21  ALT 28  ALKPHOS 95  BILITOT 0.8  PROT 8.0  ALBUMIN 4.2   No results for input(s): LIPASE, AMYLASE in the last 168 hours. No results for input(s): AMMONIA in the last 168 hours. Coagulation Profile: Recent Labs  Lab 09/14/20 1837  INR 1.0   Cardiac Enzymes: No results for input(s): CKTOTAL, CKMB, CKMBINDEX, TROPONINI in the last 168 hours. BNP (last 3 results) No results for  input(s): PROBNP in the last 8760 hours. HbA1C: No results for input(s): HGBA1C in the last 72 hours. CBG: Recent Labs  Lab 09/15/20 0103  GLUCAP 211*   Lipid Profile: Recent Labs    09/15/20 0505  CHOL 203*  HDL 28*  LDLCALC 139*  TRIG 180*  CHOLHDL 7.3   Thyroid Function Tests: No results for input(s): TSH, T4TOTAL, FREET4, T3FREE, THYROIDAB in the last 72 hours. Anemia Panel: No results for input(s): VITAMINB12, FOLATE, FERRITIN, TIBC, IRON, RETICCTPCT in the last 72 hours. Sepsis Labs: No results for input(s): PROCALCITON, LATICACIDVEN in the last 168 hours.  Recent Results (from the past 240 hour(s))  Resp Panel by RT-PCR (Flu A&B, Covid) Nasopharyngeal Swab     Status: None   Collection Time: 09/15/20  3:29 AM   Specimen: Nasopharyngeal Swab; Nasopharyngeal(NP) swabs in vial transport medium  Result Value Ref Range Status   SARS Coronavirus 2 by RT PCR NEGATIVE NEGATIVE Final    Comment: (NOTE) SARS-CoV-2 target nucleic acids are NOT DETECTED.  The SARS-CoV-2 RNA is generally detectable in upper respiratory specimens during the acute phase of infection. The lowest concentration of SARS-CoV-2 viral copies this assay can detect is 138 copies/mL. A negative result does not preclude SARS-Cov-2 infection and should not be used as the sole basis for treatment or other patient management decisions. A negative result may occur with  improper specimen collection/handling, submission of specimen other than nasopharyngeal swab, presence of viral mutation(s) within the areas targeted by this assay, and inadequate number of viral copies(<138 copies/mL). A negative result must be combined with clinical observations, patient history, and epidemiological information. The expected result is Negative.  Fact Sheet for Patients:  BloggerCourse.com  Fact Sheet for Healthcare Providers:  SeriousBroker.it  This test is no t yet  approved or cleared by the [place] FDA and  has been authorized for detection and/or diagnosis of SARS-CoV-2 by FDA under an Emergency Use Authorization (EUA). This EUA will remain  in effect (meaning this test can be used) for the duration of the COVID-19 declaration under Section 564(b)(1) of the Act, 21 U.S.C.section 360bbb-3(b)(1), unless the authorization is terminated  or revoked sooner.       Influenza A by PCR NEGATIVE NEGATIVE Final   Influenza B by PCR NEGATIVE NEGATIVE Final    Comment: (NOTE) The Xpert Xpress SARS-CoV-2/FLU/RSV plus assay is intended as an aid in the diagnosis of influenza from Nasopharyngeal swab specimens and should not be used as a sole basis for treatment. Nasal washings and aspirates are unacceptable for Xpert Xpress SARS-CoV-2/FLU/RSV testing.  Fact Sheet for Patients: BloggerCourse.com  Fact Sheet for Healthcare Providers: SeriousBroker.it  This test is not yet approved or cleared by the [place] FDA and has been authorized for detection  and/or diagnosis of SARS-CoV-2 by FDA under an Emergency Use Authorization (EUA). This EUA will remain in effect (meaning this test can be used) for the duration of the COVID-19 declaration under Section 564(b)(1) of the Act, 21 U.S.C. section 360bbb-3(b)(1), unless the authorization is terminated or revoked.  Performed at [facility], [address] [place], [place] [address]          Radiology Studies: CT HEAD WO CONTRAST  Result Date: 09/14/2020 CLINICAL DATA:  Weakness and dizziness for several hours EXAM: CT HEAD WITHOUT CONTRAST TECHNIQUE: Contiguous axial images were obtained from the base of the skull through the vertex without intravenous contrast. COMPARISON:  None. FINDINGS: Brain: No evidence of acute infarction, hemorrhage, hydrocephalus, extra-axial collection or mass lesion/mass effect. Areas of prior lacunar infarcts  are noted in the region of the anterior limb of the internal capsule on the right as well as the adjacent basal ganglia and deep white matter. Vascular: No hyperdense vessel or unexpected calcification. Skull: Normal. Negative for fracture or focal lesion. Sinuses/Orbits: No acute finding. Other: Arachnoid granulation is noted adjacent to the transverse sinus on the right best seen on sagittal image 14, coronal image 58 and axial image 11. IMPRESSION: Changes of prior infarct on the right as described. No acute abnormality is noted. Electronically Signed   By: Alcide Clever M.D.   On: 09/14/2020 19:22   MR ANGIO HEAD WO CONTRAST  Result Date: 09/14/2020 CLINICAL DATA:  Left-sided weakness and dizziness EXAM: MRI HEAD WITHOUT CONTRAST MRA HEAD WITHOUT CONTRAST TECHNIQUE: Multiplanar, multiecho pulse sequences of the brain and surrounding structures were obtained without intravenous contrast. Angiographic images of the head were obtained using MRA technique without contrast. COMPARISON:  None. FINDINGS: MRI HEAD FINDINGS Brain: There is multifocal abnormal diffusion restriction in the medial right hemisphere. No contralateral diffusion abnormality. There are old infarcts of the left cerebellum and right basal ganglia. No acute or chronic hemorrhage. There is multifocal hyperintense T2-weighted signal within the white matter. Parenchymal volume and CSF spaces are normal. The midline structures are normal. Vascular: Major flow voids are preserved. Skull and upper cervical spine: Normal calvarium and skull base. Visualized upper cervical spine and soft tissues are normal. Sinuses/Orbits:No paranasal sinus fluid levels or advanced mucosal thickening. No mastoid or middle ear effusion. Normal orbits. MRA HEAD FINDINGS POSTERIOR CIRCULATION: --Vertebral arteries: Normal --Inferior cerebellar arteries: Normal. --Basilar artery: Normal. --Superior cerebellar arteries: Normal. --Posterior cerebral arteries: Normal. ANTERIOR  CIRCULATION: --Intracranial internal carotid arteries: Normal. --Anterior cerebral arteries (ACA): Atherosclerotic irregularity of the right ACA. Absent left A1 segment, normal variant --Middle cerebral arteries (MCA): Normal. ANATOMIC VARIANTS: None IMPRESSION: 1. Multifocal acute ischemia within the medial right hemisphere. No hemorrhage or mass effect. 2. Atherosclerotic irregularity of the right anterior cerebral artery. 3. 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 Electronically Signed   By: Deatra Robinson M.D.   On: 09/14/2020 23:52   MR BRAIN WO CONTRAST  Result Date: 09/14/2020 CLINICAL DATA:  Left-sided weakness and dizziness EXAM: MRI HEAD WITHOUT CONTRAST MRA HEAD WITHOUT CONTRAST TECHNIQUE: Multiplanar, multiecho pulse sequences of the brain and surrounding structures were obtained without intravenous contrast. Angiographic images of the head were obtained using MRA technique without contrast. COMPARISON:  None. FINDINGS: MRI HEAD FINDINGS Brain: There is multifocal abnormal diffusion restriction in the medial right hemisphere. No contralateral diffusion abnormality. There are old infarcts of the left cerebellum and right basal ganglia. No acute or chronic hemorrhage. There is multifocal hyperintense T2-weighted signal  within the white matter. Parenchymal volume and CSF spaces are normal. The midline structures are normal. Vascular: Major flow voids are preserved. Skull and upper cervical spine: Normal calvarium and skull base. Visualized upper cervical spine and soft tissues are normal. Sinuses/Orbits:No paranasal sinus fluid levels or advanced mucosal thickening. No mastoid or middle ear effusion. Normal orbits. MRA HEAD FINDINGS POSTERIOR CIRCULATION: --Vertebral arteries: Normal --Inferior cerebellar arteries: Normal. --Basilar artery: Normal. --Superior cerebellar arteries: Normal. --Posterior cerebral arteries: Normal. ANTERIOR CIRCULATION: --Intracranial internal carotid  arteries: Normal. --Anterior cerebral arteries (ACA): Atherosclerotic irregularity of the right ACA. Absent left A1 segment, normal variant --Middle cerebral arteries (MCA): Normal. ANATOMIC VARIANTS: None IMPRESSION: 1. Multifocal acute ischemia within the medial right hemisphere. No hemorrhage or mass effect. 2. Atherosclerotic irregularity of the right anterior cerebral artery. 3. 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 Electronically Signed   By: Deatra Robinson M.D.   On: 09/14/2020 23:52   US Carotid Bilateral (at [facility] and [facility] only)  Result Date: 09/15/2020 CLINICAL DATA:  60 year old male with stroke. EXAM: BILATERAL CAROTID DUPLEX ULTRASOUND TECHNIQUE: Wallace Cullens scale imaging, color Doppler and duplex ultrasound were performed of bilateral carotid and vertebral arteries in the neck. COMPARISON:  None. FINDINGS: Criteria: Quantification of carotid stenosis is based on velocity parameters that correlate the residual internal carotid diameter with NASCET-based stenosis levels, using the diameter of the distal internal carotid lumen as the denominator for stenosis measurement. The following velocity measurements were obtained: RIGHT ICA: 67 cm/sec CCA: 57 cm/sec SYSTOLIC ICA/CCA RATIO:  1.2 ECA: 125 cm/sec LEFT ICA: 68 cm/sec CCA: 72 cm/sec SYSTOLIC ICA/CCA RATIO:  0.9 ECA: 105 cm/sec RIGHT CAROTID ARTERY: Minimal plaque at the carotid bulb. RIGHT VERTEBRAL ARTERY:  Patent with antegrade flow. LEFT CAROTID ARTERY: Minimal plaque at the carotid bulb and proximal ICA. LEFT VERTEBRAL ARTERY:  Patent with antegrade flow. IMPRESSION: Less than 50% ICA stenosis bilaterally. Electronically Signed   By: Elgie Collard M.D.   On: 09/15/2020 01:14        Scheduled Meds: .  stroke: mapping our early stages of recovery book   Does not apply Once  . aspirin EC  81 mg Oral Daily  . atorvastatin  80 mg Oral Daily  . clopidogrel  75 mg Oral Daily  . enoxaparin (LOVENOX) injection  0.5 mg/kg  Subcutaneous Q24H  . insulin aspart  0-15 Units Subcutaneous TID WC  . insulin aspart  0-5 Units Subcutaneous QHS  . metFORMIN  500 mg Oral BID WC  . potassium chloride  40 mEq Oral Once   Continuous Infusions:   LOS: 0 days    Time spent: 30 mins     Charise Killian, MD [facility] Pager [phone]  If 7PM-7AM, please contact night-coverage 09/15/2020, 7:23 AM

## 2020-09-16 ENCOUNTER — Inpatient Hospital Stay (HOSPITAL_COMMUNITY)
Admit: 2020-09-16 | Discharge: 2020-09-16 | Disposition: A | Discharge status: Home / Self Care | Payer: Medicare Other | Attending: Internal Medicine | Admitting: Internal Medicine

## 2020-09-16 DIAGNOSIS — I6389 Other cerebral infarction: Secondary | ICD-10-CM

## 2020-09-16 DIAGNOSIS — I10 Essential (primary) hypertension: Secondary | ICD-10-CM

## 2020-09-16 LAB — BASIC METABOLIC PANEL
Anion gap: 8 (ref 5–15)
BUN: 11 mg/dL (ref 6–20)
CO2: 25 mmol/L (ref 22–32)
Calcium: 8.9 mg/dL (ref 8.9–10.3)
Chloride: 103 mmol/L (ref 98–111)
Creatinine, Ser: 0.93 mg/dL (ref 0.61–1.24)
GFR, Estimated: >60 mL/min (ref >60)
Glucose, Bld: 170 mg/dL — ABNORMAL HIGH (ref 70–99)
Potassium: 3.3 mmol/L — ABNORMAL LOW (ref 3.5–5.1)
Sodium: 136 mmol/L (ref 135–145)

## 2020-09-16 LAB — ECHOCARDIOGRAM COMPLETE
AV Mean grad: 2 mmHg
AV Peak grad: 3.5 mmHg
Ao pk vel: 0.93 m/s
Area-P 1/2: 5.2 cm2
Height: 67 in
S' Lateral: 2.66 cm
Weight: 3680 oz

## 2020-09-16 LAB — CBC
HCT: 40.7 % (ref 39.0–52.0)
Hemoglobin: 13.8 g/dL (ref 13.0–17.0)
MCH: 29.4 pg (ref 26.0–34.0)
MCHC: 33.9 g/dL (ref 30.0–36.0)
MCV: 86.8 fL (ref 80.0–100.0)
Platelets: 221 10*3/uL (ref 150–400)
RBC: 4.69 MIL/uL (ref 4.22–5.81)
RDW: 12.8 % (ref 11.5–15.5)
WBC: 5 10*3/uL (ref 4.0–10.5)
nRBC: 0 % (ref 0.0–0.2)

## 2020-09-16 LAB — GLUCOSE, CAPILLARY
Glucose-Capillary: 157 mg/dL — ABNORMAL HIGH (ref 70–99)
Glucose-Capillary: 207 mg/dL — ABNORMAL HIGH (ref 70–99)

## 2020-09-16 MED ORDER — ASPIRIN 81 MG PO TBEC: 81.0000 mg | DELAYED_RELEASE_TABLET | Freq: Every day | ORAL | 0 refills | Status: AC

## 2020-09-16 MED ORDER — LABETALOL HCL 5 MG/ML IV SOLN
5.0000 mg | INTRAVENOUS | Status: DC | PRN
  Administered 2020-09-16 (×2): 5 mg via INTRAVENOUS

## 2020-09-16 MED ORDER — POTASSIUM CHLORIDE CRYS ER 20 MEQ PO TBCR
40.0000 meq | EXTENDED_RELEASE_TABLET | Freq: Once | ORAL | Status: AC
  Administered 2020-09-16: 40 meq via ORAL
  Filled 2020-09-16: qty 2

## 2020-09-16 MED ORDER — EMPAGLIFLOZIN 25 MG PO TABS: 25.0000 mg | ORAL_TABLET | Freq: Every day | ORAL | 0 refills | Status: DC

## 2020-09-16 MED ORDER — EMPAGLIFLOZIN 25 MG PO TABS: 25.0000 mg | ORAL_TABLET | Freq: Every day | ORAL | 2 refills | Status: DC

## 2020-09-16 NOTE — Plan of Care (Signed)
End of Shift Summary:  Remained on room air, denies pain or n/v. Urine output adequate, (-) BM. Blood glucose monitored. Hypertensive, prn labetalol given x1 with desired effect. Remained free from falls or injury. Call bell within reach and able to use.   Problem: Education: Goal: Knowledge of disease or condition will improve Outcome: Progressing   Problem: Education: Goal: Knowledge of secondary prevention will improve Outcome: Progressing   Problem: Health Behavior/Discharge Planning: Goal: Ability to manage health-related needs will improve Outcome: Progressing

## 2020-09-16 NOTE — Progress Notes (Signed)
*  PRELIMINARY RESULTS* Echocardiogram 2D Echocardiogram has been performed.  Thomas Howard 09/16/2020, 8:42 AM

## 2020-09-16 NOTE — TOC Transition Note (Signed)
Transition of Care [facility]) - CM/SW Discharge Note   Patient Details  Name: Thomas Howard MRN: [id number] Date of Birth: 10-Dec-1960  [facility] CM/SW Contact:  Caryn Section, RN Phone Number: 09/16/2020, 4:38 PM   Clinical Narrative:   Patient to be discharged with outpatient PT, referral made.   Rolling walker ordered with adapt, to be delivered to room.  No further needs.          Patient Goals and CMS Choice        Discharge Placement                       Discharge Plan and Services                                     Social Determinants of Health (SDOH) Interventions     Readmission Risk Interventions No flowsheet data found.

## 2020-09-16 NOTE — Progress Notes (Signed)
Neurology Progress Note  Patient ID: This is a 60 year old male with a past medical history significant for stroke risk factors of uncontrolled hypercholesterolemia (LDL 139), uncontrolled diabetes (last A1c 9% in July 2021, repeat here pending), uncontrolled hypertension this admission, ongoing smoking, obstructive sleep apnea nonadherent to CPAP who presented on 5/7 with dysarthria and imbalance and was found to have scattered punctate strokes in the R ACA/MCA watershed territory  Subjective: - NAEON - No new complaints today - Feels back to recent baseline, no deficits other than mild chronic LLE weakness  Interval data: TTE showed no intracardiac clot  Exam: Vitals:   09/16/20 0811 09/16/20 1141  BP: (!) 190/104 (!) 179/102  Pulse: 85 82  Resp: 16 16  Temp: 98.4 F (36.9 C) 98.7 F (37.1 C)  SpO2: 97% 100%   Physical Exam  Constitutional: Appears well-developed and well-nourished.  Obese Psych: Affect appropriate to situation, calm and cooperative Eyes: No scleral injection HENT: No oropharyngeal obstruction.  MSK: no joint deformities.  Cardiovascular: Normal rate and regular rhythm.  Respiratory: Effort normal, non-labored breathing GI: Soft.  No distension. There is no tenderness.  Skin: Warm dry and intact visible skin  Neuro: Mental Status: Patient is awake, alert, oriented to person, place, month, year, and situation. Patient is able to give a clear and coherent history. No signs of aphasia or neglect Cranial Nerves: II: Visual Fields are full. Pupils are equal, round, and reactive to light, though bilateral cataracts are visible.   III,IV, VI: EOMI without ptosis or diploplia.  V: Facial sensation is symmetric to light touch VII: Facial movement is symmetric.  VIII: hearing is intact to voice X: Uvula elevates symmetrically XI: Shoulder shrug is reduced on the left side (baseline). XII: tongue is midline without atrophy or fasciculations.  Motor: Tone is  normal. Bulk is normal. 5/5 strength was present in all four extremities except for mild left wrist flexion (4) and finger extension (4) weakness, and hip flexion (4 -) and knee flexion (4 -) weakness.  Sensory: Sensation is symmetric to light touch and temperature in the arms and legs. Deep Tendon Reflexes: 2+ and symmetric in the biceps and patellae.  Cerebellar: Mild dysmetria at end reach with the right upper extremity.  Slower but intact in the left arm and leg.  Intact in the right lower extremity  NIHSS total 1 Score breakdown: 1 point for left leg weakness  MRI brain personally reviewed, scattered punctate strokes in the right ACA/MCA watershed territory MRI personally reviewed, atherosclerotic irregularity of the right ACA, which could be the culprit stenosis for strokes Carotid ultrasounds negative for hemodynamically significant stenosis (less than 50% stenosis bilaterally)  Impression: This is a 60 year old male with a past medical history significant for stroke risk factors of uncontrolled hypercholesterolemia (LDL 139), uncontrolled diabetes (last A1c 9% in July 2021, repeat here pending), uncontrolled hypertension this admission, ongoing smoking, obstructive sleep apnea nonadherent to CPAP.  His significant uncontrolled vascular risk factors are the underlying etiology of his intracranial stenosis leading to this stroke,  Recommendations:  #Right ACA territory strokes secondary to right ACA stenosis -Dual antiplatelet therapy for 90 days based on right ACA stenosis              -Continue home Plavix 75 mg indefinitely             -Aspirin 325 mg load followed by 81 mg daily for 90 days - Continue atorvastatin 80mg  daily (LDL 139) -Risk factor modification counseling: smoking cessation, diet,  exercise, medication adherence, CPAP compliance, weight loss.   -Blood pressure goal: gradual progression to normotension, agents per primary team/PCP -Outpatient neurology follow-up   -Ambulatory referral to lipid clinic placed given uncontrolled hyperlipidemia despite maximal dose of atorvastatin -Consider referral to hypertension clinic -Consider referral to nutritionist - No further neurologic workup indicated on an inpatient basis at this time. Patient already has established neurologist at [facility] and should follow up with them in clinic within 4 wks.  Neurology to sign off, but please re-engage if additional questions arise.   Bing Neighbors, MD [facility] [phone]  If 7pm- 7am, please page neurology on call as listed in [facility]ION.

## 2020-09-16 NOTE — Discharge Instructions (Signed)
If interested, please ask primary care provider about prescribing FreeStyle Libre (flash glucose monitoring sensor).

## 2020-09-16 NOTE — Progress Notes (Signed)
Physical Therapy Treatment Patient Details Name: Thomas Howard MRN: [id number] DOB: 1961-01-28 Today's Date: 09/16/2020    History of Present Illness The pt is a pleasant 60 yo male who presented to ED on 09/14/2020 with c/o of imbalance, where pt had difficulty getting up and out of his chair and experienced a fall to his knees. Per MRI brain: Multifocal acute ischemia within the medial right hemisphere. Per chart PMH significant for hypertension, non-insulin-dependent diabetes mellitus, hyperlipidemia, history of stroke (affecting L side), truncal obesity, tobacco use, obstructive sleep apnea.    PT Comments    Patient is making excellent progress towards functional independence. Increased ambulation distance this session with no reported dizziness or fatigue with activity. Patient ambulated without rolling walker for a short distance in room but requested to use walker for hallway ambulation. Recommend to use rolling walker for longer distance ambulation for safety as patient also reports mild left midfoot pain (he thinks is gout related). Patient is mobilizing well enough to be discharged home with family support, however PT will continue to follow to maximize independence and facilitate return to prior level of function while patient still in the hospital.     Follow Up Recommendations  Outpatient PT     Equipment Recommendations  Rolling walker with 5" wheels    Recommendations for Other Services       Precautions / Restrictions Precautions Precautions: Fall Restrictions Weight Bearing Restrictions: No    Mobility  Bed Mobility Overal bed mobility:  (not observed)                  Transfers Overall transfer level: Modified independent               General transfer comment: patient performed multiple standing bouts from bed without physical assistance. patient educated on hand placement for safety and demonstrated correct technique with unilateral UE support and  no support  Ambulation/Gait Ambulation/Gait assistance: Supervision Gait Distance (Feet): 220 Feet Assistive device: Rolling walker (2 wheeled) Gait Pattern/deviations: Step-through pattern Gait velocity: decreased   General Gait Details: pt ambulated short distance in the room without rolling walker with no loss of balance. patient requesting to use rolling walker for hallway ambultion for safety. occasional cues for safety provided without loss of balance noted. patient does report mild left mid foot pain with walking that he feels is gout related. encouraged patient to use rolling walker for long distance ambulation for safety and fall prevention at this time   Stairs             Wheelchair Mobility    Modified Rankin (Stroke Patients Only)       Balance                                            Cognition Arousal/Alertness: Awake/alert Behavior During Therapy: WFL for tasks assessed/performed Overall Cognitive Status: Within Functional Limits for tasks assessed                                 General Comments: patient standing up in the room, dressed and waiting to be discharged. supportive spouse in the room during session      Exercises      General Comments        Pertinent Vitals/Pain Pain Assessment: Faces Faces Pain Scale:  Hurts a little bit Pain Location: left mid foot (while walking) Pain Descriptors / Indicators: Sore Pain Intervention(s): Limited activity within patient's tolerance;Monitored during session    Home Living                      Prior Function            PT Goals (current goals can now be found in the care plan section) Acute Rehab PT Goals Patient Stated Goal: to go home PT Goal Formulation: With patient Time For Goal Achievement: 09/29/20 Potential to Achieve Goals: Good Progress towards PT goals: Progressing toward goals    Frequency    Min 2X/week      PT Plan Current  plan remains appropriate    Co-evaluation              AM-PAC PT "6 Clicks" Mobility   Outcome Measure  Help needed turning from your back to your side while in a flat bed without using bedrails?: None Help needed moving from lying on your back to sitting on the side of a flat bed without using bedrails?: None Help needed moving to and from a bed to a chair (including a wheelchair)?: None Help needed standing up from a chair using your arms (e.g., wheelchair or bedside chair)?: None Help needed to walk in hospital room?: A Little Help needed climbing 3-5 steps with a railing? : A Little 6 Click Score: 22    End of Session   Activity Tolerance: Patient tolerated treatment well Patient left:  (standing up in the room, spouse present) Nurse Communication: Mobility status PT Visit Diagnosis: Unsteadiness on feet (R26.81);History of falling (Z91.81);Other abnormalities of gait and mobility (R26.89);Muscle weakness (generalized) (M62.81)     Time: 1093-2355 PT Time Calculation (min) (ACUTE ONLY): 26 min  Charges:  $Gait Training: 23-37 mins                     Donna Bernard, PT, MPT    Ina Homes 09/16/2020, 1:59 PM

## 2020-09-16 NOTE — Discharge Summary (Addendum)
Physician Discharge Summary  Thomas Howard IWL:[id number] DOB: 10/19/1960 DOA: 09/14/2020  PCP: Ria Bush, MD  Admit date: 09/14/2020 Discharge date: 09/16/2020  Admitted From: home  Disposition:  Home   Recommendations for Outpatient Follow-up:  1. Follow up with PCP in 1-2 weeks 2. F/u w/ neuro, Dr. Manuella Ghazi, in 1-2 weeks   Home Health: no  Equipment/Devices:  Discharge Condition: stable  CODE STATUS: full  Diet recommendation: Heart Healthy / Carb Modified  Brief/Interim Summary: HPI was taken from Dr. [name]: Thomas Howard is a right-handed, 60 y.o. male with medical history significant for hypertension, non-insulin-dependent diabetes mellitus, hyperlipidemia, history of stroke, truncal obesity, tobacco use, obstructive sleep apnea.  He presents emergency department for chief concerns of imbalance.  He states that at approximately 9:30 AM on day of presentation, he felt imbalance when getting up.  He reports that holding had to hold on to things around to make it around the room. He had difficulty getting up from his chair. His spouse at bedside, April, helped him forward and he fell to his knees. He states that when he stands, he feels like he's going to fall to the side or backwards.   He states he has never felt this way before. He denies recent head trauma and LOC.  He denies bug bites.  He denies changes to his medications, diet.  He denies chest pain, shortness of breath, vision changes, double vision. He denies recent cruise or flight. He denies numbness. Spouse, endorsed slurring of speech that is still persistent depending on the words.   He endorses baseline loose/water stool. He takes metformin.   [name] is compliant with plavix. He does not take a daily aspirin.   At bedside, patient had markedly decreased strength both squeezing and flexion extension of the left upper extremity compared to the right.  Social History: lives at home with spouse. He endorses  tobacco use, peak of 2 ppd, currently 1 ppd. He infrequently drinks etoh, last drink was February. He denies recreatioanl drug use. 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   Vaccinations: he is not vaccinated for [facility] course from Dr. [name] 5/8-09/16/20: Pt was found to have CVA of the medial right hemisphere as per MRI brain. Pt was continue on home dose of plavix, statin & started on aspirin. Echo was done which showed EF of 17-40%, grade I diastolic function, no wall motion abnormalities & no atrial level shunts. B/l carotid US shows less than 50% stenosis b/l. Neurology did evaluate the pt, please see their note for further details. OT did not recommend any f/u and PT recommended outpatient PT. For more information, please see previous progress/consult notes.   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 Echo was done which showed EF of 81-44%, grade I diastolic function, no wall motion abnormalities & no atrial level shunts. B/l carotid US shows less  than 50% stenosis b/l.  IV labetalol prn. Continue w/ neuro checks. Continue on home dose of plavix and continue on aspirin  Smoker: smoking cessation counseling. Nicotine gum to prevent w/drawals  HTN: continue on lisinopril, HCTZ, amlodipine  DM2: likely poorly controlled. Will hold home  dose of metformin, jardiance. Continue on SSI w/ accuchecks  Hypokalemia: KCl repleated. Will continue to monitor   OSA: CPAP qhs  Obesity: BMI 36.0. Complicates overall care and prognosis     Discharge Instructions  Discharge Instructions    AMB Referral to Advanced Lipid Disorders Clinic   Complete by: As directed    Internal Lipid Clinic Referral Scheduling  Internal lipid clinic referrals are providers within [facility], who wish to refer established patients for routine management (help in starting PCSK9 inhibitor therapy) or advanced therapies.  Internal MD referral criteria:              1. All patients with LDL>190 mg/dL  2. All patients with Triglycerides >500 mg/dL  3. Patients with suspected or confirmed heterozygous familial hyperlipidemia (HeFH) or homozygous familial hyperlipidemia (HoFH)  4. Patients with family history of suspicious for genetic dyslipidemia desiring genetic testing  5. Patients refractory to standard guideline based therapy  6. Patients with statin intolerance (failed 2 statins, one of which must be a high potency statin)  7. Patients who the provider desires to be seen by MD   Internal PharmD referral criteria:   1. Follow-up patients for medication management  2. Follow-up for compliance monitoring  3. Patients for drug education  4. Patients with statin intolerance  5. PCSK9 inhibitor education and prior authorization approvals  6. Patients with triglycerides <500 mg/dL  External Lipid Clinic Referral  External lipid clinic referrals are for providers outside of [facility], considered new clinic patients - automatically routed to MD schedule   Diet - low sodium heart healthy   Complete by: As directed    Diet Carb Modified   Complete by: As directed    Discharge instructions   Complete by: As directed    F/u w/ PCP in 1-2 weeks. F/u w/ neurology, Dr. Manuella Ghazi, in 1-2 weeks   Increase activity slowly   Complete by: As directed       Allergies as of 09/16/2020   No Known Allergies     Medication List    TAKE these medications   amLODipine 10 MG tablet Commonly known as: NORVASC Take 1 tablet (10 mg total) by mouth daily.   aspirin 81 MG EC tablet Take 1 tablet (81 mg total) by mouth daily. Swallow whole. Start taking on: Sep 17, 2020   atorvastatin 80 MG tablet Commonly known as: LIPITOR Take 1 tablet (80 mg total) by mouth daily.   clopidogrel 75 MG tablet Commonly known as: PLAVIX Take 1 tablet (75 mg total) by mouth daily.   colchicine 0.6 MG tablet Take 1 tablet (0.6 mg total) by mouth daily as needed (gout flare).   empagliflozin 25 MG Tabs tablet Commonly known as: JARDIANCE Take 1 tablet (25 mg total) by mouth daily.   glucose blood test strip Commonly known as: ONE TOUCH ULTRA TEST Use as instructed to check sugars BID E11.49   hydrochlorothiazide 50 MG tablet Commonly known as: HYDRODIURIL Take 0.5 tablets (25 mg total) by mouth daily.   lisinopril 40 MG tablet Commonly known as: ZESTRIL Take 1 tablet (40 mg total) by mouth daily.   metFORMIN 750 MG 24 hr tablet Commonly known as: Glucophage XR Take  1 tablet (750 mg total) by mouth daily with breakfast for 7 days, THEN 2 tablets (1,500 mg total) daily with breakfast. Start taking on: December 06, 2019 What changed: Another medication with the same name was removed. Continue taking this medication, and follow the directions you see here.   ONE TOUCH ULTRA 2 w/Device Kit Use as directed E11.49   onetouch ultrasoft lancets Use as instructed to check sugars BID E11.49   potassium chloride 10 MEQ tablet Commonly known as: KLOR-CON Take 1 tablet (10 mEq total) by mouth daily.       No Known Allergies  Consultations:  Neuro, Dr. Curly Shores    Procedures/Studies: CT HEAD WO CONTRAST  Result Date: 09/14/2020 CLINICAL DATA:  Weakness and dizziness for several hours EXAM: CT HEAD WITHOUT CONTRAST TECHNIQUE: Contiguous axial images  were obtained from the base of the skull through the vertex without intravenous contrast. COMPARISON:  None. FINDINGS: Brain: No evidence of acute infarction, hemorrhage, hydrocephalus, extra-axial collection or mass lesion/mass effect. Areas of prior lacunar infarcts are noted in the region of the anterior limb of the internal capsule on the right as well as the adjacent basal ganglia and deep white matter. Vascular: No hyperdense vessel or unexpected calcification. Skull: Normal. Negative for fracture or focal lesion. Sinuses/Orbits: No acute finding. Other: Arachnoid granulation is noted adjacent to the transverse sinus on the right best seen on sagittal image 14, coronal image 58 and axial image 11. IMPRESSION: Changes of prior infarct on the right as described. No acute abnormality is noted. Electronically Signed   By: Inez Catalina M.D.   On: 09/14/2020 19:22   MR ANGIO HEAD WO CONTRAST  Result Date: 09/14/2020 CLINICAL DATA:  Left-sided weakness and dizziness EXAM: MRI HEAD WITHOUT CONTRAST MRA HEAD WITHOUT CONTRAST TECHNIQUE: Multiplanar, multiecho pulse sequences of the brain and surrounding structures were obtained without intravenous contrast. Angiographic images of the head were obtained using MRA technique without contrast. COMPARISON:  None. FINDINGS: MRI HEAD FINDINGS Brain: There is multifocal abnormal diffusion restriction in the medial right hemisphere. No contralateral diffusion abnormality. There are old infarcts of the left cerebellum and right basal ganglia. No acute or chronic hemorrhage. There is multifocal hyperintense T2-weighted signal within the white matter. Parenchymal volume and CSF spaces are normal. The midline structures are normal. Vascular: Major flow voids are preserved. Skull and upper cervical spine: Normal calvarium and skull base. Visualized upper cervical spine and soft tissues are normal. Sinuses/Orbits:No paranasal sinus fluid levels or advanced mucosal thickening. No  mastoid or middle ear effusion. Normal orbits. MRA HEAD FINDINGS POSTERIOR CIRCULATION: --Vertebral arteries: Normal --Inferior cerebellar arteries: Normal. --Basilar artery: Normal. --Superior cerebellar arteries: Normal. --Posterior cerebral arteries: Normal. ANTERIOR CIRCULATION: --Intracranial internal carotid arteries: Normal. --Anterior cerebral arteries (ACA): Atherosclerotic irregularity of the right ACA. Absent left A1 segment, normal variant --Middle cerebral arteries (MCA): Normal. ANATOMIC VARIANTS: None IMPRESSION: 1. Multifocal acute ischemia within the medial right hemisphere. No hemorrhage or mass effect. 2. Atherosclerotic irregularity of the right anterior cerebral artery. 3. 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 Electronically Signed   By: Ulyses Jarred M.D.   On: 09/14/2020 23:52   MR BRAIN WO CONTRAST  Result Date: 09/14/2020 CLINICAL DATA:  Left-sided weakness and dizziness EXAM: MRI HEAD WITHOUT CONTRAST MRA HEAD WITHOUT CONTRAST TECHNIQUE: Multiplanar, multiecho pulse sequences of the brain and surrounding structures were obtained without intravenous contrast. Angiographic images of the head were obtained using MRA technique without contrast. COMPARISON:  None. FINDINGS: MRI  HEAD FINDINGS Brain: There is multifocal abnormal diffusion restriction in the medial right hemisphere. No contralateral diffusion abnormality. There are old infarcts of the left cerebellum and right basal ganglia. No acute or chronic hemorrhage. There is multifocal hyperintense T2-weighted signal within the white matter. Parenchymal volume and CSF spaces are normal. The midline structures are normal. Vascular: Major flow voids are preserved. Skull and upper cervical spine: Normal calvarium and skull base. Visualized upper cervical spine and soft tissues are normal. Sinuses/Orbits:No paranasal sinus fluid levels or advanced mucosal thickening. No mastoid or middle ear effusion. Normal orbits. MRA  HEAD FINDINGS POSTERIOR CIRCULATION: --Vertebral arteries: Normal --Inferior cerebellar arteries: Normal. --Basilar artery: Normal. --Superior cerebellar arteries: Normal. --Posterior cerebral arteries: Normal. ANTERIOR CIRCULATION: --Intracranial internal carotid arteries: Normal. --Anterior cerebral arteries (ACA): Atherosclerotic irregularity of the right ACA. Absent left A1 segment, normal variant --Middle cerebral arteries (MCA): Normal. ANATOMIC VARIANTS: None IMPRESSION: 1. Multifocal acute ischemia within the medial right hemisphere. No hemorrhage or mass effect. 2. Atherosclerotic irregularity of the right anterior cerebral artery. 3. 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 Electronically Signed   By: Ulyses Jarred M.D.   On: 09/14/2020 23:52   US Carotid Bilateral (at [facility] and [facility] only)  Result Date: 09/15/2020 CLINICAL DATA:  60 year old male with stroke. EXAM: BILATERAL CAROTID DUPLEX ULTRASOUND TECHNIQUE: Pearline Cables scale imaging, color Doppler and duplex ultrasound were performed of bilateral carotid and vertebral arteries in the neck. COMPARISON:  None. FINDINGS: Criteria: Quantification of carotid stenosis is based on velocity parameters that correlate the residual internal carotid diameter with NASCET-based stenosis levels, using the diameter of the distal internal carotid lumen as the denominator for stenosis measurement. The following velocity measurements were obtained: RIGHT ICA: 67 cm/sec CCA: 57 cm/sec SYSTOLIC ICA/CCA RATIO:  1.2 ECA: 125 cm/sec LEFT ICA: 68 cm/sec CCA: 72 cm/sec SYSTOLIC ICA/CCA RATIO:  0.9 ECA: 105 cm/sec RIGHT CAROTID ARTERY: Minimal plaque at the carotid bulb. RIGHT VERTEBRAL ARTERY:  Patent with antegrade flow. LEFT CAROTID ARTERY: Minimal plaque at the carotid bulb and proximal ICA. LEFT VERTEBRAL ARTERY:  Patent with antegrade flow. IMPRESSION: Less than 50% ICA stenosis bilaterally. Electronically Signed   By: Anner Crete M.D.   On: 09/15/2020  01:14   ECHOCARDIOGRAM COMPLETE  Result Date: 09/16/2020    ECHOCARDIOGRAM REPORT   Patient Name:   ABDULKARIM EBERLIN Date of Exam: 09/16/2020 Medical Rec #:  [id number]     Height:       67.0 in Accession #:    [id number]    Weight:       230.0 lb Date of Birth:  January 03, 1961     BSA:          2.146 m Patient Age:    60 years      BP:           159/85 mmHg Patient Gender: M             HR:           80 bpm. Exam Location:  [facility] Procedure: 2D Echo, Cardiac Doppler and Color Doppler Indications:     Stroke I63.9  History:         Patient has no prior history of Echocardiogram examinations.                  Stroke; Risk Factors:Hypertension and Diabetes. History of                  smoking.  Sonographer:  Sherrie Sport RDCS (AE) Referring Phys:  [id number] AMY N COX Diagnosing Phys: Ida Rogue MD IMPRESSIONS  1. Left ventricular ejection fraction, by estimation, is 60 to 65%. The left ventricle has normal function. The left ventricle has no regional wall motion abnormalities. There is mild left ventricular hypertrophy. Left ventricular diastolic parameters are consistent with Grade I diastolic dysfunction (impaired relaxation).  2. Right ventricular systolic function is normal. The right ventricular size is normal. There is normal pulmonary artery systolic pressure. The estimated right ventricular systolic pressure is 08.6 mmHg.  3. The mitral valve is normal in structure. Mild mitral valve regurgitation. FINDINGS  Left Ventricle: Left ventricular ejection fraction, by estimation, is 60 to 65%. The left ventricle has normal function. The left ventricle has no regional wall motion abnormalities. The left ventricular internal cavity size was normal in size. There is  mild left ventricular hypertrophy. Left ventricular diastolic parameters are consistent with Grade I diastolic dysfunction (impaired relaxation). Right Ventricle: The right ventricular size is normal. No increase in right ventricular wall thickness. Right  ventricular systolic function is normal. There is normal pulmonary artery systolic pressure. The tricuspid regurgitant velocity is 1.76 m/s, and  with an assumed right atrial pressure of 5 mmHg, the estimated right ventricular systolic pressure is 76.1 mmHg. Left Atrium: Left atrial size was normal in size. Right Atrium: Right atrial size was normal in size. Pericardium: There is no evidence of pericardial effusion. Mitral Valve: The mitral valve is normal in structure. Mild mitral valve regurgitation. No evidence of mitral valve stenosis. Tricuspid Valve: The tricuspid valve is normal in structure. Tricuspid valve regurgitation is trivial. No evidence of tricuspid stenosis. Aortic Valve: The aortic valve is normal in structure. Aortic valve regurgitation is not visualized. No aortic stenosis is present. Aortic valve mean gradient measures 2.0 mmHg. Aortic valve peak gradient measures 3.5 mmHg. Pulmonic Valve: The pulmonic valve was normal in structure. Pulmonic valve regurgitation is not visualized. No evidence of pulmonic stenosis. Aorta: The aortic root is normal in size and structure. Venous: The inferior vena cava is normal in size with greater than 50% respiratory variability, suggesting right atrial pressure of 3 mmHg. IAS/Shunts: No atrial level shunt detected by color flow Doppler.  LEFT VENTRICLE PLAX 2D LVIDd:         3.60 cm Diastology LVIDs:         2.66 cm LV e' medial:    5.11 cm/s LV PW:         1.22 cm LV E/e' medial:  15.9 LV IVS:        0.95 cm LV e' lateral:   3.70 cm/s                        LV E/e' lateral: 22.0  RIGHT VENTRICLE RV Basal diam:  3.42 cm RV S prime:     13.60 cm/s TAPSE (M-mode): 4.0 cm LEFT ATRIUM             Index       RIGHT ATRIUM           Index LA diam:        3.40 cm 1.58 cm/m  RA Area:     18.80 cm LA Vol (A2C):   48.9 ml 22.79 ml/m RA Volume:   52.90 ml  24.65 ml/m LA Vol (A4C):   38.0 ml 17.71 ml/m LA Biplane Vol: 43.3 ml 20.18 ml/m  AORTIC VALVE  PULMONIC VALVE AV Vmax:           93.30 cm/s  PV Vmax:        0.88 m/s AV Vmean:          69.000 cm/s PV Peak grad:   3.1 mmHg AV VTI:            0.176 m     RVOT Peak grad: 2 mmHg AV Peak Grad:      3.5 mmHg AV Mean Grad:      2.0 mmHg LVOT Vmax:         80.90 cm/s LVOT Vmean:        56.300 cm/s LVOT VTI:          0.167 m LVOT/AV VTI ratio: 0.95  AORTA Ao Root diam: 2.40 cm MITRAL VALVE                TRICUSPID VALVE MV Area (PHT): 5.20 cm     TR Peak grad:   12.4 mmHg MV Decel Time: 146 msec     TR Vmax:        176.00 cm/s MV E velocity: 81.40 cm/s MV A velocity: 108.00 cm/s  SHUNTS MV E/A ratio:  0.75         Systemic VTI: 0.17 m Ida Rogue MD Electronically signed by Ida Rogue MD Signature Date/Time: 09/16/2020/2:41:33 PM    Final        Subjective: Pt c/o craving for cigarettes    Discharge Exam: Vitals:   09/16/20 0811 09/16/20 1141  BP: (!) 190/104 (!) 179/102  Pulse: 85 82  Resp: 16 16  Temp: 98.4 F (36.9 C) 98.7 F (37.1 C)  SpO2: 97% 100%   Vitals:   09/16/20 0200 09/16/20 0336 09/16/20 0811 09/16/20 1141  BP: (!) 184/106 (!) 159/85 (!) 190/104 (!) 179/102  Pulse: 77 80 85 82  Resp: 17 18 16 16   Temp: 98.6 F (37 C) 98.6 F (37 C) 98.4 F (36.9 C) 98.7 F (37.1 C)  TempSrc: Oral Oral Oral Oral  SpO2: 98% 96% 97% 100%  Weight:      Height:        General: Pt is alert, awake, not in acute distress Cardiovascular:S1/S2 +, no rubs, no gallops Respiratory: CTA bilaterally, no wheezing, no rhonchi Abdominal: Soft, NT, obes, bowel sounds + Extremities: no cyanosis    The results of significant diagnostics from this hospitalization (including imaging, microbiology, ancillary and laboratory) are listed below for reference.     Microbiology: Recent Results (from the past 240 hour(s))  Resp Panel by RT-PCR (Flu A&B, Covid) Nasopharyngeal Swab     Status: None   Collection Time: 09/15/20  3:29 AM   Specimen: Nasopharyngeal Swab; Nasopharyngeal(NP) swabs in  vial transport medium  Result Value Ref Range Status   SARS Coronavirus 2 by RT PCR NEGATIVE NEGATIVE Final    Comment: (NOTE) SARS-CoV-2 target nucleic acids are NOT DETECTED.  The SARS-CoV-2 RNA is generally detectable in upper respiratory specimens during the acute phase of infection. The lowest concentration of SARS-CoV-2 viral copies this assay can detect is 138 copies/mL. A negative result does not preclude SARS-Cov-2 infection and should not be used as the sole basis for treatment or other patient management decisions. A negative result may occur with  improper specimen collection/handling, submission of specimen other than nasopharyngeal swab, presence of viral mutation(s) within the areas targeted by this assay, and inadequate number of viral copies(<138 copies/mL). A negative result must be combined  with clinical observations, patient history, and epidemiological information. The expected result is Negative.  Fact Sheet for Patients:  EntrepreneurPulse.com.au  Fact Sheet for Healthcare Providers:  IncredibleEmployment.be  This test is no t yet approved or cleared by the [place] FDA and  has been authorized for detection and/or diagnosis of SARS-CoV-2 by FDA under an Emergency Use Authorization (EUA). This EUA will remain  in effect (meaning this test can be used) for the duration of the COVID-19 declaration under Section 564(b)(1) of the Act, 21 U.S.C.section 360bbb-3(b)(1), unless the authorization is terminated  or revoked sooner.       Influenza A by PCR NEGATIVE NEGATIVE Final   Influenza B by PCR NEGATIVE NEGATIVE Final    Comment: (NOTE) The Xpert Xpress SARS-CoV-2/FLU/RSV plus assay is intended as an aid in the diagnosis of influenza from Nasopharyngeal swab specimens and should not be used as a sole basis for treatment. Nasal washings and aspirates are unacceptable for Xpert Xpress SARS-CoV-2/FLU/RSV testing.  Fact  Sheet for Patients: EntrepreneurPulse.com.au  Fact Sheet for Healthcare Providers: IncredibleEmployment.be  This test is not yet approved or cleared by the [place] FDA and has been authorized for detection and/or diagnosis of SARS-CoV-2 by FDA under an Emergency Use Authorization (EUA). This EUA will remain in effect (meaning this test can be used) for the duration of the COVID-19 declaration under Section 564(b)(1) of the Act, 21 U.S.C. section 360bbb-3(b)(1), unless the authorization is terminated or revoked.  Performed at [facility], [address] [place], [place] [address]      Labs: BNP (last 3 results) No results for input(s): BNP in the last 8760 hours. Basic Metabolic Panel: Recent Labs  Lab 09/14/20 1837 09/15/20 0505 09/16/20 0307  NA 133* 135 136  K 3.6 3.2* 3.3*  CL 99 100 103  CO2 25 25 25   GLUCOSE 262* 203* 170*  BUN 13 13 11   CREATININE 1.12 1.02 0.93  CALCIUM 9.1 8.8* 8.9   Liver Function Tests: Recent Labs  Lab 09/14/20 1837  AST 21  ALT 28  ALKPHOS 95  BILITOT 0.8  PROT 8.0  ALBUMIN 4.2   No results for input(s): LIPASE, AMYLASE in the last 168 hours. No results for input(s): AMMONIA in the last 168 hours. CBC: Recent Labs  Lab 09/14/20 1837 09/15/20 0505 09/16/20 0307  WBC 5.9 6.0 5.0  NEUTROABS 3.0  --   --   HGB 15.1 14.5 13.8  HCT 44.6 42.3 40.7  MCV 86.4 85.5 86.8  PLT 242 244 221   Cardiac Enzymes: No results for input(s): CKTOTAL, CKMB, CKMBINDEX, TROPONINI in the last 168 hours. BNP: Invalid input(s): POCBNP CBG: Recent Labs  Lab 09/15/20 1158 09/15/20 1504 09/15/20 2206 09/16/20 0831 09/16/20 1139  GLUCAP 331* 142* 157* 157* 207*   D-Dimer No results for input(s): DDIMER in the last 72 hours. Hgb A1c Recent Labs    09/15/20 0505  HGBA1C 9.8*   Lipid Profile Recent Labs    09/15/20 0505  CHOL 203*  HDL 28*  LDLCALC 139*  TRIG 180*  CHOLHDL 7.3    Thyroid function studies No results for input(s): TSH, T4TOTAL, T3FREE, THYROIDAB in the last 72 hours.  Invalid input(s): FREET3 Anemia work up No results for input(s): VITAMINB12, FOLATE, FERRITIN, TIBC, IRON, RETICCTPCT in the last 72 hours. Urinalysis    Component Value Date/Time   COLORURINE YELLOW (A) 09/15/2020 0911   APPEARANCEUR CLEAR (A) 09/15/2020 0911   LABSPEC 1.014 09/15/2020 0911   PHURINE 5.0  09/15/2020 0911   GLUCOSEU NEGATIVE 09/15/2020 0911   HGBUR NEGATIVE 09/15/2020 0911   BILIRUBINUR NEGATIVE 09/15/2020 0911   KETONESUR NEGATIVE 09/15/2020 0911   PROTEINUR NEGATIVE 09/15/2020 0911   NITRITE NEGATIVE 09/15/2020 0911   LEUKOCYTESUR NEGATIVE 09/15/2020 0911   Sepsis Labs Invalid input(s): PROCALCITONIN,  WBC,  LACTICIDVEN Microbiology Recent Results (from the past 240 hour(s))  Resp Panel by RT-PCR (Flu A&B, Covid) Nasopharyngeal Swab     Status: None   Collection Time: 09/15/20  3:29 AM   Specimen: Nasopharyngeal Swab; Nasopharyngeal(NP) swabs in vial transport medium  Result Value Ref Range Status   SARS Coronavirus 2 by RT PCR NEGATIVE NEGATIVE Final    Comment: (NOTE) SARS-CoV-2 target nucleic acids are NOT DETECTED.  The SARS-CoV-2 RNA is generally detectable in upper respiratory specimens during the acute phase of infection. The lowest concentration of SARS-CoV-2 viral copies this assay can detect is 138 copies/mL. A negative result does not preclude SARS-Cov-2 infection and should not be used as the sole basis for treatment or other patient management decisions. A negative result may occur with  improper specimen collection/handling, submission of specimen other than nasopharyngeal swab, presence of viral mutation(s) within the areas targeted by this assay, and inadequate number of viral copies(<138 copies/mL). A negative result must be combined with clinical observations, patient history, and epidemiological information. The expected result  is Negative.  Fact Sheet for Patients:  EntrepreneurPulse.com.au  Fact Sheet for Healthcare Providers:  IncredibleEmployment.be  This test is no t yet approved or cleared by the [place] FDA and  has been authorized for detection and/or diagnosis of SARS-CoV-2 by FDA under an Emergency Use Authorization (EUA). This EUA will remain  in effect (meaning this test can be used) for the duration of the COVID-19 declaration under Section 564(b)(1) of the Act, 21 U.S.C.section 360bbb-3(b)(1), unless the authorization is terminated  or revoked sooner.       Influenza A by PCR NEGATIVE NEGATIVE Final   Influenza B by PCR NEGATIVE NEGATIVE Final    Comment: (NOTE) The Xpert Xpress SARS-CoV-2/FLU/RSV plus assay is intended as an aid in the diagnosis of influenza from Nasopharyngeal swab specimens and should not be used as a sole basis for treatment. Nasal washings and aspirates are unacceptable for Xpert Xpress SARS-CoV-2/FLU/RSV testing.  Fact Sheet for Patients: EntrepreneurPulse.com.au  Fact Sheet for Healthcare Providers: IncredibleEmployment.be  This test is not yet approved or cleared by the [place] FDA and has been authorized for detection and/or diagnosis of SARS-CoV-2 by FDA under an Emergency Use Authorization (EUA). This EUA will remain in effect (meaning this test can be used) for the duration of the COVID-19 declaration under Section 564(b)(1) of the Act, 21 U.S.C. section 360bbb-3(b)(1), unless the authorization is terminated or revoked.  Performed at [facility], [address], [place], [place] [address]      Time coordinating discharge: Over 30 minutes  SIGNED:   Wyvonnia Dusky, MD  [facility] 09/16/2020, 4:06 PM Pager   If 7PM-7AM, please contact night-coverage

## 2020-09-16 NOTE — Telephone Encounter (Signed)
Colchicine Last rx:  05/12/18, #30 Last OV:  12/06/19, CPE Next OV:  12/06/20, W 2 MCR exam

## 2020-09-16 NOTE — Progress Notes (Signed)
SLP Cancellation Note  Patient Details Name: Thomas Howard MRN: [id number] DOB: 08/02/1960   Cancelled treatment:       Reason Eval/Treat Not Completed: SLP screened, no needs identified, will sign off (chart reviewed; consulted NSG then met w/ pt and Wife in room). Pt denied any difficulty swallowing and is currently on a regular diet; tolerates swallowing pills w/ water per NSG. Pt conversed at conversational level w/out deficits noted; pt and Wife denied any speech-language deficits. He stated he feels he is at his Baseline again. He also stated he was ready to "go home soon". No further skilled ST services indicated as pt appears at his baseline. Pt agreed. NSG to reconsult if any change in status while admitted.     Katherine Watson, MS, CCC-SLP Speech Language Pathologist Rehab Services [phone] Watson,Katherine 09/16/2020, 11:47 AM   

## 2020-09-16 NOTE — Progress Notes (Addendum)
Inpatient Diabetes Program Recommendations  AACE/ADA: New Consensus Statement on Inpatient Glycemic Control   Target Ranges:  Prepandial:   less than 140 mg/dL      Peak postprandial:   less than 180 mg/dL (1-2 hours)      Critically ill patients:  140 - 180 mg/dL  Results for Thomas Howard, Thomas Howard (MRN [id number]) as of 09/16/2020 11:30  Ref. Range 09/15/2020 07:46 09/15/2020 11:58 09/15/2020 15:04 09/15/2020 22:06 09/16/2020 08:31  Glucose-Capillary Latest Ref Range: 70 - 99 mg/dL 371 (H) 062 (H) 694 (H) 157 (H) 157 (H)   Results for Thomas Howard, Thomas Howard (MRN [id number]) as of 09/16/2020 11:30  Ref. Range 11/29/2019 07:46 09/15/2020 05:05  Hemoglobin A1C Latest Ref Range: 4.8 - 5.6 % 9.0 (H) 9.8 (H)   Review of Glycemic Control  Diabetes history: DM2 Outpatient Diabetes medications: Jardiance 25 mg daily (has been out for 1 month), Metformin 500 mg BID (has Metformin XR 750 mg tablets at home but not started yet; wanted to finish up Metformin 500 mg tablets first) Current orders for Inpatient glycemic control: Novolog 0-15 units TID with meals, Novolog 0-5 units QHS  Inpatient Diabetes Program Recommendations:    HbgA1C: A1C 9.8% on 09/15/20 indicating an average glucose of 235 mg/dl over the past 2-3 months.  NOTE: Called patient's room to inquire about outpatient DM medications. Spoke with patient's wife and she states that she is the one who handles all patient's medications for him. She confirms that he is prescribed Jardiance and Metformin for DM control. She states that patient was getting London Pepper free through a patient assistance program but he has been out of the [place] for about 1 month. She states that patient now has insurance and they should be able to afford. She also reports that patient has been taking Metformin 500 mg tablets because he wanted to finish up the 500 mg tablets before starting Metformin XR 750 mg tablets.  She states that patient would not let her check glucose before but now he  understands that he has to start monitoring glucose at home so she feels that patient will now allow her to do that. Discussed FreeStyle Libre (flash glucose monitoring sensor) and encouraged her to talk with patient's PCP about it if they are interested in using it. Inquired about patient's last A1C and she states that his last A1C was 9.9%. Discussed current A1C 9.8% on 09/15/20 indicating an average glucose of 235 mg/dl Discussed glucose and K0X goals. Discussed importance of checking CBGs and maintaining good CBG control to prevent long-term and short-term complications. Explained how hyperglycemia leads to damage within blood vessels which lead to the common complications seen with uncontrolled diabetes. Encouraged patient's wife to be sure patient is taking DM medications as prescribed, checking glucose consistently, and following up with PCP routinely. Patient's wife states that patient will need Rx for Jardiance at time of discharge.  Encouraged her to talk with patient's PCP if medication was too expensive when they went to get it filled. She reports that they have all needed testing supplies at home to start monitoring glucose. Patient's wife verbalized understanding of information discussed and reports no further questions at this time related to diabetes.  Thanks, Orlando Penner, RN, MSN, CDE Diabetes Coordinator Inpatient Diabetes Program [phone] (Team Pager)

## 2020-09-17 ENCOUNTER — Telehealth: Payer: Self-pay

## 2020-09-17 MED ORDER — COLCHICINE 0.6 MG PO TABS: 0.6000 mg | ORAL_TABLET | Freq: Every day | ORAL | 1 refills | Status: DC | PRN

## 2020-09-17 NOTE — Telephone Encounter (Signed)
Transition Care Management Follow-up Telephone Call  Date of discharge and from where: 09/16/2020, [facility]  How have you been since you were released from the hospital? Spoke with April Blackwell (HIPAA verified). She stated that patient is feeling so much better. Resting at home comfortably.  Any questions or concerns? No  Items Reviewed:  Did the pt receive and understand the discharge instructions provided? Yes   Medications obtained and verified? Yes   Other? No   Any new allergies since your discharge? No   Dietary orders reviewed? Yes  Do you have support at home? Yes   Home Care and Equipment/Supplies: Were home health services ordered? not applicable If so, what is the name of the agency? N/A  Has the agency set up a time to come to the patient's home? not applicable Were any new equipment or medical supplies ordered?  No What is the name of the medical supply agency? N/A Were you able to get the supplies/equipment? not applicable Do you have any questions related to the use of the equipment or supplies? No  Functional Questionnaire: (I = Independent and D = Dependent) ADLs: I  Bathing/Dressing- I  Meal Prep- I  Eating- I  Maintaining continence- I  Transferring/Ambulation- I  Managing Meds- I  Follow up appointments reviewed:   PCP Hospital f/u appt confirmed? Yes  Scheduled to see Dr. Sharen Hones on 09/24/2020 @ 11 am.  Specialist Hospital f/u appt confirmed? follow up with neurology   Are transportation arrangements needed? No   If their condition worsens, is the pt aware to call PCP or go to the Emergency Dept.? Yes  Was the patient provided with contact information for the PCP's office or ED? Yes  Was to pt encouraged to call back with questions or concerns? Yes

## 2020-09-17 NOTE — Telephone Encounter (Signed)
Transition Care Management Unsuccessful Follow-up Telephone Call  Date of discharge and from where:  09/16/2020, [facility]  Attempts:  1st Attempt  Reason for unsuccessful TCM follow-up call:  Left voice message

## 2020-09-24 ENCOUNTER — Ambulatory Visit (INDEPENDENT_AMBULATORY_CARE_PROVIDER_SITE_OTHER): Payer: Medicare Other | Admitting: Family Medicine

## 2020-09-24 ENCOUNTER — Other Ambulatory Visit: Payer: Self-pay

## 2020-09-24 ENCOUNTER — Encounter: Payer: Self-pay | Admitting: Family Medicine

## 2020-09-24 VITALS — BP 160/70 | HR 100 | Temp 98.2°F | Ht 67.0 in | Wt 249.6 lb

## 2020-09-24 DIAGNOSIS — G4733 Obstructive sleep apnea (adult) (pediatric): Secondary | ICD-10-CM

## 2020-09-24 DIAGNOSIS — I63521 Cerebral infarction due to unspecified occlusion or stenosis of right anterior cerebral artery: Secondary | ICD-10-CM

## 2020-09-24 DIAGNOSIS — F17200 Nicotine dependence, unspecified, uncomplicated: Secondary | ICD-10-CM

## 2020-09-24 DIAGNOSIS — I10 Essential (primary) hypertension: Secondary | ICD-10-CM | POA: Diagnosis not present

## 2020-09-24 DIAGNOSIS — M1A00X0 Idiopathic chronic gout, unspecified site, without tophus (tophi): Secondary | ICD-10-CM

## 2020-09-24 DIAGNOSIS — E1169 Type 2 diabetes mellitus with other specified complication: Secondary | ICD-10-CM

## 2020-09-24 DIAGNOSIS — IMO0002 Reserved for concepts with insufficient information to code with codable children: Secondary | ICD-10-CM

## 2020-09-24 DIAGNOSIS — E1149 Type 2 diabetes mellitus with other diabetic neurological complication: Secondary | ICD-10-CM

## 2020-09-24 DIAGNOSIS — E785 Hyperlipidemia, unspecified: Secondary | ICD-10-CM

## 2020-09-24 DIAGNOSIS — I6930 Unspecified sequelae of cerebral infarction: Secondary | ICD-10-CM | POA: Diagnosis not present

## 2020-09-24 DIAGNOSIS — I6611 Occlusion and stenosis of right anterior cerebral artery: Secondary | ICD-10-CM

## 2020-09-24 DIAGNOSIS — E1165 Type 2 diabetes mellitus with hyperglycemia: Secondary | ICD-10-CM

## 2020-09-24 MED ORDER — COLCHICINE 0.6 MG PO CAPS: 1.0000 | ORAL_CAPSULE | Freq: Every day | ORAL | 3 refills | Status: AC | PRN

## 2020-09-24 MED ORDER — METOPROLOL TARTRATE 25 MG PO TABS: 25.0000 mg | ORAL_TABLET | Freq: Two times a day (BID) | ORAL | 3 refills | Status: DC

## 2020-09-24 NOTE — Progress Notes (Signed)
Patient ID: Mazen Marcin, male    DOB: 03-16-1961, 60 y.o.   MRN: [id number]  This visit was conducted in person.  BP (!) 160/70 (BP Location: Right Arm, Cuff Size: Large)   Pulse 100   Temp 98.2 F (36.8 C) (Temporal)   Ht 5' 7"  (1.702 m)   Wt 249 lb 9 oz (113.2 kg)   SpO2 93%   BMI 39.09 kg/m   BP Readings from Last 3 Encounters:  09/24/20 (!) 160/70  09/16/20 (!) 179/102  12/06/19 (!) 116/64   CC: hosp f/u visit Subjective:   HPI: Mehkai Gallo is a 60 y.o. male presenting on 09/24/2020 for Hospitalization Follow-up (Admitted on 09/14/20 to [facility], dx stroke.  Pt accompanied by wife, April- temp 97.9.)   R handed Recent hospitalization for imbalance and slurred speech, found to have LUE hemiparesis and MRI showed acute CVA of medial R hemisphere. Plavix was continued, aspirin 40m was started. Echocardiogram returned reassuringly normal as well as carotid UKoreastable (<50% stenosis bilaterally). Neurology consulted, he also received PT evaluation - rec outpatient PT. Planned DAPT x 90 days then return to plain plavix. Thought uncontrolled hypertension and diabetes contributory.   Since home overall balance issues have improved but notes acute R medial foot pain between arch and heel of foot. No redness, warmth or swelling. Treating presumed gout with aleve - colchicine was unaffordable. Ran out of jardiance - [place].  Declines outpatient PT referral.  They are in process of applying for Medicare part D.  He continues weekly ozempic.  [place]  OSA - not using CPAP machine - states could not tolerate.   Taking all prescribed meds well. BP readings all recently elevated, doesn't check BP or sugars at home.  He previously stopped sodas, is now drinking plenty of water.  Continues smoking, no exercice.  Per neuro consult: MRI brain - scattered punctate strokes in  the right ACA/MCA watershed territory MRI - atherosclerotic irregularity of the right ACA, which could be the culprit stenosis for strokes  Admit date: 09/14/2020 Discharge date: 09/16/2020 TCM hosp f/u phone call completed on 09/17/2020  Admitted From: home  Disposition:  Home   Recommendations for Outpatient Follow-up:  1. Follow up with PCP in 1-2 weeks 2. F/u w/ neuro, Dr. SManuella Ghazi in 1-2 weeks   Discharge Diagnoses:  Principal Problem:   CVA (cerebral vascular accident) ([facility] Active Problems:   Hypertension associated with diabetes ([place]   Severe obesity (BMI 35.0-39.9) with comorbidity ([place]   History of stroke with residual deficit   OSA (obstructive sleep apnea)   Smoker   Occlusion and stenosis of left anterior cerebral artery   Uncontrolled type 2 diabetes mellitus with neurologic complication, without long-term current use of insulin (HCC)   Hemiplegia and hemiparesis following cerebral infarction affecting left non-dominant side (HCC)   Vertigo   MDD (major depressive disorder), single episode, moderate ([place]  Home Health: no  Equipment/Devices:  Discharge Condition: stable  CODE STATUS: full  Diet recommendation: Heart Healthy / Carb Modified     Relevant past medical, surgical, family and social history reviewed and updated as indicated. Interim medical history since our last visit reviewed. Allergies and medications reviewed and updated. Outpatient Medications Prior to Visit  Medication Sig Dispense Refill  . amLODipine (NORVASC) 10 MG tablet Take 1 tablet (10 mg total) by mouth daily. 90 tablet 3  . aspirin EC  81 MG EC tablet Take 1 tablet (81 mg total) by mouth daily. Swallow whole. 90 tablet 0  . atorvastatin (LIPITOR) 80 MG tablet Take 1 tablet (80 mg total) by mouth daily. 90 tablet 3  . Blood Glucose Monitoring Suppl (ONE TOUCH ULTRA 2) w/Device KIT Use as directed E11.49 1 each 0  . clopidogrel (PLAVIX) 75 MG tablet Take 1 tablet (75 mg total) by mouth  daily. 90 tablet 3  . empagliflozin (JARDIANCE) 25 MG TABS tablet Take 1 tablet (25 mg total) by mouth daily. 30 tablet 0  . glucose blood (ONE TOUCH ULTRA TEST) test strip Use as instructed to check sugars BID E11.49 100 each 3  . hydrochlorothiazide (HYDRODIURIL) 50 MG tablet Take 0.5 tablets (25 mg total) by mouth daily. 45 tablet 3  . Lancets (ONETOUCH ULTRASOFT) lancets Use as instructed to check sugars BID E11.49 100 each 3  . lisinopril (ZESTRIL) 40 MG tablet Take 1 tablet (40 mg total) by mouth daily. 90 tablet 3  . potassium chloride (KLOR-CON) 10 MEQ tablet Take 1 tablet (10 mEq total) by mouth daily. 90 tablet 3  . Semaglutide,0.25 or 0.5MG/DOS, (OZEMPIC, 0.25 OR 0.5 MG/DOSE,) 2 MG/1.5ML SOPN Inject 0.5 mg into the skin once a week. 1.5 mL   . colchicine 0.6 MG tablet Take 1 tablet (0.6 mg total) by mouth daily as needed (gout flare). 30 tablet 1  . metFORMIN (GLUCOPHAGE XR) 750 MG 24 hr tablet Take 1 tablet (750 mg total) by mouth daily with breakfast for 7 days, THEN 2 tablets (1,500 mg total) daily with breakfast. 180 tablet 3   No facility-administered medications prior to visit.     Per HPI unless specifically indicated in ROS section below Review of Systems Objective:  BP (!) 160/70 (BP Location: Right Arm, Cuff Size: Large)   Pulse 100   Temp 98.2 F (36.8 C) (Temporal)   Ht 5' 7"  (1.702 m)   Wt 249 lb 9 oz (113.2 kg)   SpO2 93%   BMI 39.09 kg/m   Wt Readings from Last 3 Encounters:  09/24/20 249 lb 9 oz (113.2 kg)  09/14/20 230 lb (104.3 kg)  12/06/19 (!) 240 lb (108.9 kg)      Physical Exam Vitals and nursing note reviewed.  Constitutional:      Appearance: Normal appearance. He is obese. He is not ill-appearing.  Cardiovascular:     Rate and Rhythm: Normal rate and regular rhythm.     Pulses: Normal pulses.     Heart sounds: Normal heart sounds. No murmur heard.   Pulmonary:     Effort: Pulmonary effort is normal. No respiratory distress.     Breath  sounds: Normal breath sounds. No wheezing, rhonchi or rales.  Musculoskeletal:     Right lower leg: No edema.     Left lower leg: No edema.     Comments: Mild L lateral foot pain distal to lateral malleolus without erythema or swelling. No pain at navicular or base of 5th MT  Skin:    General: Skin is warm and dry.     Findings: No rash.  Neurological:     Mental Status: He is alert.     Comments:  L grip strength decreased compared to right  5/5 strength BLE  Psychiatric:        Mood and Affect: Mood normal.        Behavior: Behavior normal.       Assessment & Plan:  This visit occurred during  the SARS-CoV-2 public health emergency.  Safety protocols were in place, including screening questions prior to the visit, additional usage of staff PPE, and extensive cleaning of exam room while observing appropriate contact time as indicated for disinfecting solutions.   Problem List Items Addressed This Visit    Hypertension    Chronic, uncontrolled despite compliance with amlodipine 35m, hctz 217mdaily, lisinopril 4034maily. Will add on metoprolol 83m49md for better BP control, I asked him to start monitoring blood pressures closely at home and let me know readings.       Relevant Medications   metoprolol tartrate (LOPRESSOR) 25 MG tablet   Severe obesity (BMI 35.0-39.9) with comorbidity (HCC)   Relevant Medications   Semaglutide,0.25 or 0.5MG/DOS, (OZEMPIC, 0.25 OR 0.5 MG/DOSE,) 2 MG/1.5ML SOPN   History of stroke with residual deficit   OSA (obstructive sleep apnea)    No longer using CPAP. Discussed relation of uncontrolled OSA to stroke risk.       Smoker    Continued smoker. Encouraged full smoking cessation.       Uncontrolled type 2 diabetes mellitus with neurologic complication, without long-term current use of insulin (HCC)    Chronic, uncontrolled.  Continues metformin XR, jardiance 83mg36mempic 0.5mg w14mly. Will work towards renewing patient assistance program  for jardiaCoca-Colaad run out of jardiance). He does not want insulin injections.  [facility] [place].       Relevant Medications   Semaglutide,0.25 or 0.5MG/DOS, (OZEMPIC, 0.25 OR 0.5 MG/DOSE,) 2 MG/1.5ML SOPN   Gout    Discussed need to limit NSAID in setting of recent CVA.  Colchicine was unaffordable. Will price out mitigare to use PRN.       [facility]re[facility] vascular [facility]    Recent recurrent stroke suffered earlier this month. Hospitalization records reviewed with patient. Discussed relation of uncontrolled hypertension and diabetes and increased stroke risk. Encouraged renewed efforts towards tighter blood pressure, glycemic and cholesterol control.  Plan is DAPT x 3 months then return to plavix monotherapy.  He declines outpatient PT referral at this time as states acute stroke symptoms of imbalance and L hemiparesis have largely resolved.  Does have upcoming neurology appt next month.       Relevant Medications   metoprolol tartrate (LOPRESSOR) 25 MG tablet   Occlusion and stenosis of right anterior cerebral artery   Relevant Medications   metoprolol tartrate (LOPRESSOR) 25 MG tablet   Hyperlipidemia associated with type 2 diabetes mellitus (HCC)    Chronic, uncontrolled despite full dose atorvastatin 80mg  20ms been referred to lipid clinic to review additional agents.  The ASCVD Risk score (Goff DMikey Bussing, et al., 2013) failed to calculate for the following reasons:   The patient has a prior MI or stroke diagnosis       Relevant Medications   Semaglutide,0.25 or 0.5MG/DOS, (OZEMPIC, 0.25 OR 0.5 MG/DOSE,) 2 MG/1.5ML SOPN       Meds ordered this encounter  Medications  . Colchicine (MITIGARE) 0.6 MG CAPS    Sig: Take 1 capsule by mouth daily as needed (gout flare).    Dispense:  30 capsule    Refill:  3  . metoprolol tartrate (LOPRESSOR) 25 MG tablet    Sig: Take 1 tablet (25  mg total) by mouth 2 (two) times daily.    Dispense:  60 tablet    Refill:  3   No  orders of the defined types were placed in this encounter.   Patient Instructions  We will help you with jardiance and ozempic applications for patient assistance - this needs to be renewed yearly.  Continue plavix + aspirin 28m daily for 3 months, then return to just plavix daily.  Schedule neurology and cholesterol clinic appointments, let u[place]oreaknow if any trouble scheduling.  Price out colchicine (mitigare) to use as needed for gout.  Start metoprolol 220mtwice daily for better blood pressure control, start monitoring blood pressures at home Return in 1 month for follow up visit.   Follow up plan: Return in about 4 weeks (around 10/22/2020) for follow up visit.  JaRia BushMD

## 2020-09-24 NOTE — Patient Instructions (Addendum)
We will help you with jardiance and ozempic applications for patient assistance - this needs to be renewed yearly.  Continue plavix + aspirin 81mg  daily for 3 months, then return to just plavix daily.  Schedule neurology and cholesterol clinic appointments, let know if any trouble scheduling.  Price out colchicine (mitigare) to use as needed for gout.  Start metoprolol 25mg  twice daily for better blood pressure control, start monitoring blood pressures at home Return in 1 month for follow up visit.

## 2020-09-25 ENCOUNTER — Encounter: Payer: Self-pay | Admitting: Family Medicine

## 2020-09-25 ENCOUNTER — Telehealth: Payer: Self-pay | Admitting: Family Medicine

## 2020-09-25 ENCOUNTER — Telehealth: Payer: Self-pay

## 2020-09-25 DIAGNOSIS — I6611 Occlusion and stenosis of right anterior cerebral artery: Secondary | ICD-10-CM | POA: Insufficient documentation

## 2020-09-25 DIAGNOSIS — E1169 Type 2 diabetes mellitus with other specified complication: Secondary | ICD-10-CM | POA: Insufficient documentation

## 2020-09-25 DIAGNOSIS — E785 Hyperlipidemia, unspecified: Secondary | ICD-10-CM | POA: Insufficient documentation

## 2020-09-25 DIAGNOSIS — I6612 Occlusion and stenosis of left anterior cerebral artery: Secondary | ICD-10-CM

## 2020-09-25 DIAGNOSIS — I63521 Cerebral infarction due to unspecified occlusion or stenosis of right anterior cerebral artery: Secondary | ICD-10-CM

## 2020-09-25 NOTE — Assessment & Plan Note (Signed)
Discussed need to limit NSAID in setting of recent CVA.  Colchicine was unaffordable. Will price out mitigare to use PRN.

## 2020-09-25 NOTE — Assessment & Plan Note (Signed)
Continued smoker. Encouraged full smoking cessation.

## 2020-09-25 NOTE — Assessment & Plan Note (Addendum)
Recent recurrent stroke suffered earlier this month. Hospitalization records reviewed with patient. Discussed relation of uncontrolled hypertension and diabetes and increased stroke risk. Encouraged renewed efforts towards tighter blood pressure, glycemic and cholesterol control.  Plan is DAPT x 3 months then return to plavix monotherapy.  He declines outpatient PT referral at this time as states acute stroke symptoms of imbalance and L hemiparesis have largely resolved.  Does have upcoming neurology appt next month.

## 2020-09-25 NOTE — Assessment & Plan Note (Addendum)
Chronic, uncontrolled.  Continues metformin XR, jardiance 25mg , ozempic 0.5mg  weekly. Will work towards renewing patient assistance program for (he had run out of jardiance). He does not want insulin injections.  He is looking into further assistance through the [employer]. Discussed possible endo eval.

## 2020-09-25 NOTE — Assessment & Plan Note (Signed)
Chronic, uncontrolled despite full dose atorvastatin 80mg   - has been referred to lipid clinic to review additional agents.  The ASCVD Risk score DC Jr., et al., 2013) failed to calculate for the following reasons:   The patient has a prior MI or stroke diagnosis

## 2020-09-25 NOTE — Telephone Encounter (Signed)
Good morning Patient is in process of applying for medicare part D as well as looking into [place] medication assistance options. Can we reapply for patient assistance program for this patient's jardiance and ozempic?  Thank you

## 2020-09-25 NOTE — Assessment & Plan Note (Signed)
Chronic, uncontrolled despite compliance with amlodipine 10mg , hctz 25mg  daily, lisinopril 40mg  daily. Will add on metoprolol 25mg  bid for better BP control, I asked him to start monitoring blood pressures closely at home and let me know readings.

## 2020-09-25 NOTE — Telephone Encounter (Signed)
Patient needs assistance renewing his PAP applications for Jardiance and Ozempic. Please contact him to discuss proof of income and signature. See PCP message below.  Thomas Boyden, MD 1 hour ago (7:37 AM)     Good morning Patient is in process of applying for medicare part D as well as looking into [place] medication assistance options. Can we reapply for patient assistance program for this patient's jardiance and ozempic?  Thank you       Phil Dopp, PharmD Clinical Pharmacist [facility] [phone]

## 2020-09-25 NOTE — Assessment & Plan Note (Signed)
No longer using CPAP. Discussed relation of uncontrolled OSA to stroke risk.

## 2020-09-26 NOTE — Chronic Care Management (AMB) (Signed)
Contacted patient's daughter April to inform her that Mr. Martos patient assistance applications for Ozempic and London Pepper have been completed and are at the front desk at [employer]. She did not answer phone. Left message for return call.    Follow-Up:  Patient Assistance Coordination and Pharmacist Review  Phil Dopp, CPP notified  Jomarie Longs, [facility] Clinical Pharmacy Assistant [phone]

## 2020-09-27 LAB — METHYLMALONIC ACID, SERUM: Methylmalonic Acid, Quantitative: 124 nmol/L (ref 0–378)

## 2020-10-25 ENCOUNTER — Other Ambulatory Visit: Payer: Self-pay

## 2020-10-25 ENCOUNTER — Encounter: Payer: Self-pay | Admitting: Family Medicine

## 2020-10-25 ENCOUNTER — Ambulatory Visit (INDEPENDENT_AMBULATORY_CARE_PROVIDER_SITE_OTHER): Payer: Medicare Other | Admitting: Family Medicine

## 2020-10-25 VITALS — BP 134/82 | HR 77 | Temp 97.6°F | Ht 67.0 in | Wt 244.6 lb

## 2020-10-25 DIAGNOSIS — I6611 Occlusion and stenosis of right anterior cerebral artery: Secondary | ICD-10-CM

## 2020-10-25 DIAGNOSIS — I6930 Unspecified sequelae of cerebral infarction: Secondary | ICD-10-CM | POA: Diagnosis not present

## 2020-10-25 DIAGNOSIS — E1169 Type 2 diabetes mellitus with other specified complication: Secondary | ICD-10-CM | POA: Diagnosis not present

## 2020-10-25 DIAGNOSIS — I10 Essential (primary) hypertension: Secondary | ICD-10-CM | POA: Diagnosis not present

## 2020-10-25 DIAGNOSIS — E1165 Type 2 diabetes mellitus with hyperglycemia: Secondary | ICD-10-CM | POA: Diagnosis not present

## 2020-10-25 DIAGNOSIS — E785 Hyperlipidemia, unspecified: Secondary | ICD-10-CM | POA: Diagnosis not present

## 2020-10-25 DIAGNOSIS — E1149 Type 2 diabetes mellitus with other diabetic neurological complication: Secondary | ICD-10-CM

## 2020-10-25 DIAGNOSIS — IMO0002 Reserved for concepts with insufficient information to code with codable children: Secondary | ICD-10-CM

## 2020-10-25 LAB — BASIC METABOLIC PANEL
BUN: 14 mg/dL (ref 6–23)
CO2: 29 mEq/L (ref 19–32)
Calcium: 9.5 mg/dL (ref 8.4–10.5)
Chloride: 101 mEq/L (ref 96–112)
Creatinine, Ser: 1.24 mg/dL (ref 0.40–1.50)
GFR: 64.41 mL/min (ref >60.00)
Glucose, Bld: 167 mg/dL — ABNORMAL HIGH (ref 70–99)
Potassium: 3.9 mEq/L (ref 3.5–5.1)
Sodium: 138 mEq/L (ref 135–145)

## 2020-10-25 NOTE — Assessment & Plan Note (Signed)
Chronic, improved control. Continue 4 drug regimen. Advised metoprolol is twice daily dosing.

## 2020-10-25 NOTE — Assessment & Plan Note (Signed)
Uncontrolled despite full dose atorvastatin.  Lipid clinic appt (with Dr Rennis Golden) scheduled for 01/2021.

## 2020-10-25 NOTE — Assessment & Plan Note (Signed)
Chronic uncontrolled. Currently only taking metformin XR 1500mg  daily.  Will price out ozempic and/or jardiance with medicare part D benefits. I asked he and wife to notify me if med unaffordable to change regimen.

## 2020-10-25 NOTE — Patient Instructions (Addendum)
Blood pressures  are better controlled! We will look into patient assistance for jardiance or ozempic.  Labs today  Return as needed or in 3 months for physical/wellness visit

## 2020-10-25 NOTE — Progress Notes (Signed)
Patient ID: Thomas Howard, male    DOB: Dec 27, 1960, 60 y.o.   MRN: [id number]  This visit was conducted in person.  BP 134/82   Pulse 77   Temp 97.6 F (36.4 C) (Temporal)   Ht _0  (1.702 m)   Wt 244 lb 9 oz (110.9 kg)   SpO2 96%   BMI 38.30 kg/m    CC: 1 mo f/u visit  Subjective:   HPI: Thomas Howard is a 60 y.o. male presenting on 10/25/2020 for Follow-up (Here for 1 mo f/u.)   Scheduled neurology f/u next month ([place]).   Recurrent CVAs, latest last month 09/2020. Now on plavix and aspirin x90 days then transition to plavix alone.   HTN - continues amlodipine 16m daily, hctz 28mdaily (1/2 of 5057mab), lisinopril 42m33mily and metoprolol 25mg65m (he's only taking once daily). He continues potassium.   DM - on metformin XR 1500mg 1my. He is not taking ozempic or jardiance - just got medicare prescription benefit card and will price out. Doesn't check sugars at home. Doesn't have glucometer. Jut had pedicure. Doesn't want to return to podiatry.  Lab Results  Component Value Date   HGBA1C 9.8 (H) 09/15/2020   Diabetic Foot Exam - Simple   Simple Foot Form Diabetic Foot exam was performed with the following findings: Yes 10/25/2020 12:02 PM  Visual Inspection See comments: Yes Sensation Testing Intact to touch and monofilament testing bilaterally: Yes Pulse Check See comments: Yes Comments 1+ DP bilaterally Preulcerative callus to left lateral sole  Thickened elongated nails         Relevant past medical, surgical, family and social history reviewed and updated as indicated. Interim medical history since our last visit reviewed. Allergies and medications reviewed and updated. Outpatient Medications Prior to Visit  Medication Sig Dispense Refill   amLODipine (NORVASC) 10 MG tablet Take 1 tablet (10 mg total) by mouth daily. 90 tablet 3   aspirin EC 81 MG EC tablet Take 1 tablet (81 mg total) by mouth daily. Swallow whole. 90 tablet 0   atorvastatin  (LIPITOR) 80 MG tablet Take 1 tablet (80 mg total) by mouth daily. 90 tablet 3   Blood Glucose Monitoring Suppl (ONE TOUCH ULTRA 2) w/Device KIT Use as directed E11.49 1 each 0   clopidogrel (PLAVIX) 75 MG tablet Take 1 tablet (75 mg total) by mouth daily. 90 tablet 3   Colchicine (MITIGARE) 0.6 MG CAPS Take 1 capsule by mouth daily as needed (gout flare). 30 capsule 3   glucose blood (ONE TOUCH ULTRA TEST) test strip Use as instructed to check sugars BID E11.49 100 each 3   hydrochlorothiazide (HYDRODIURIL) 50 MG tablet Take 0.5 tablets (25 mg total) by mouth daily. 45 tablet 3   Lancets (ONETOUCH ULTRASOFT) lancets Use as instructed to check sugars BID E11.49 100 each 3   lisinopril (ZESTRIL) 40 MG tablet Take 1 tablet (40 mg total) by mouth daily. 90 tablet 3   metoprolol tartrate (LOPRESSOR) 25 MG tablet Take 1 tablet (25 mg total) by mouth 2 (two) times daily. 60 tablet 3   potassium chloride (KLOR-CON) 10 MEQ tablet Take 1 tablet (10 mEq total) by mouth daily. 90 tablet 3   Semaglutide,0.25 or 0.5MG/DOS, (OZEMPIC, 0.25 OR 0.5 MG/DOSE,) 2 MG/1.5ML SOPN Inject 0.5 mg into the skin once a week. 1.5 mL    metFORMIN (GLUCOPHAGE XR) 750 MG 24 hr tablet Take 1 tablet (750 mg total) by mouth daily with breakfast for  7 days, THEN 2 tablets (1,500 mg total) daily with breakfast. 180 tablet 3   No facility-administered medications prior to visit.     Per HPI unless specifically indicated in ROS section below Review of Systems Objective:  BP 134/82   Pulse 77   Temp 97.6 F (36.4 C) (Temporal)   Ht _0  (1.702 m)   Wt 244 lb 9 oz (110.9 kg)   SpO2 96%   BMI 38.30 kg/m   Wt Readings from Last 3 Encounters:  10/25/20 244 lb 9 oz (110.9 kg)  09/24/20 249 lb 9 oz (113.2 kg)  09/14/20 230 lb (104.3 kg)      Physical Exam Vitals and nursing note reviewed.  Constitutional:      Appearance: Normal appearance. He is not ill-appearing.  Eyes:     Extraocular Movements: Extraocular movements  intact.     Conjunctiva/sclera: Conjunctivae normal.     Pupils: Pupils are equal, round, and reactive to light.  Cardiovascular:     Rate and Rhythm: Normal rate and regular rhythm.     Pulses: Normal pulses.     Heart sounds: Normal heart sounds. No murmur heard. Pulmonary:     Effort: Pulmonary effort is normal. No respiratory distress.     Breath sounds: Normal breath sounds. No wheezing, rhonchi or rales.  Musculoskeletal:     Right lower leg: No edema.     Left lower leg: No edema.     Comments: See HPI for foot exam if done  Skin:    General: Skin is warm and dry.     Findings: No rash.  Neurological:     Mental Status: He is alert.  Psychiatric:        Mood and Affect: Mood normal.        Behavior: Behavior normal.      Lab Results  Component Value Date   CHOL 203 (H) 09/15/2020   HDL 28 (L) 09/15/2020   LDLCALC 139 (H) 09/15/2020   TRIG 180 (H) 09/15/2020   CHOLHDL 7.3 09/15/2020    Assessment & Plan:  This visit occurred during the SARS-CoV-2 public health emergency.  Safety protocols were in place, including screening questions prior to the visit, additional usage of staff PPE, and extensive cleaning of exam room while observing appropriate contact time as indicated for disinfecting solutions.   Problem List Items Addressed This Visit     Hypertension    Chronic, improved control. Continue 4 drug regimen. Advised metoprolol is twice daily dosing.        History of stroke with residual deficit   Uncontrolled type 2 diabetes mellitus with neurologic complication, without long-term current use of insulin (HCC) - Primary    Chronic uncontrolled. Currently only taking metformin XR 1555m daily.  Will price out ozempic and/or jardiance with medicare part D benefits. I asked he and wife to notify me if med unaffordable to change regimen.        Relevant Orders   Basic metabolic panel   Fructosamine   Occlusion and stenosis of right anterior cerebral artery    Hyperlipidemia associated with type 2 diabetes mellitus ([place]    Uncontrolled despite full dose atorvastatin.  Lipid clinic appt (with Dr HDebara Pickett scheduled for 01/2021.          No orders of the defined types were placed in this encounter.  Orders Placed This Encounter  Procedures   Basic metabolic panel   Fructosamine    Patient Instructions  Blood pressures  are better controlled! We will look into patient assistance for jardiance or ozempic.  Labs today  Return as needed or in 3 months for physical/wellness visit    Follow up plan: Return in about 3 months (around 01/25/2021) for annual exam, prior fasting for blood work, medicare wellness visit.  Ria Bush, MD

## 2020-10-27 LAB — FRUCTOSAMINE: Fructosamine: 381 umol/L — ABNORMAL HIGH (ref 205–285)

## 2020-11-04 ENCOUNTER — Other Ambulatory Visit: Payer: Self-pay | Admitting: Family Medicine

## 2020-11-04 NOTE — Telephone Encounter (Signed)
Looks like med was discontinued on 09/24/20.

## 2020-11-05 NOTE — Telephone Encounter (Signed)
Colchicine was unaffordable so mitigare was sent in its place last month, with refills. Plz check with pharmacy to see which is more affordable.

## 2020-11-08 NOTE — Telephone Encounter (Signed)
Noted. Plain colchicine was sent in.

## 2020-11-08 NOTE — Telephone Encounter (Signed)
Spoke [facility] asking about cost of meds.  States colchicine tabs seem to be the least expensive option at $36.87.

## 2020-11-26 ENCOUNTER — Other Ambulatory Visit: Payer: Self-pay | Admitting: Family Medicine

## 2020-11-27 ENCOUNTER — Other Ambulatory Visit: Payer: Self-pay | Admitting: Family Medicine

## 2020-11-27 DIAGNOSIS — E1165 Type 2 diabetes mellitus with hyperglycemia: Secondary | ICD-10-CM

## 2020-11-27 DIAGNOSIS — IMO0002 Reserved for concepts with insufficient information to code with codable children: Secondary | ICD-10-CM

## 2020-11-27 DIAGNOSIS — Z125 Encounter for screening for malignant neoplasm of prostate: Secondary | ICD-10-CM

## 2020-11-27 DIAGNOSIS — E1169 Type 2 diabetes mellitus with other specified complication: Secondary | ICD-10-CM

## 2020-11-27 DIAGNOSIS — M1A00X0 Idiopathic chronic gout, unspecified site, without tophus (tophi): Secondary | ICD-10-CM

## 2020-11-29 ENCOUNTER — Other Ambulatory Visit: Payer: Self-pay

## 2020-12-02 ENCOUNTER — Institutional Professional Consult (permissible substitution): Payer: Medicare Other | Admitting: Diagnostic Neuroimaging

## 2020-12-05 ENCOUNTER — Ambulatory Visit: Payer: Medicare Other

## 2020-12-05 NOTE — Progress Notes (Deleted)
Subjective:   Thomas Howard is a 60 y.o. male who presents for Medicare Annual/Subsequent preventive examination.  Review of Systems: N/A      I connected with the patient today by telephone and verified that I am speaking with the correct person using two identifiers. Location patient: home Location nurse: work Persons participating in the telephone visit: patient, nurse.   I discussed the limitations, risks, security and privacy concerns of performing an evaluation and management service by telephone and the availability of in person appointments. I also discussed with the patient that there may be a patient responsible charge related to this service. The patient expressed understanding and verbally consented to this telephonic visit.              Objective:    There were no vitals filed for this visit. There is no height or weight on file to calculate BMI.  Advanced Directives 09/14/2020 01/17/2018 11/18/2017 11/01/2017 10/20/2017 10/15/2017 10/12/2017  Does Patient Have a Medical Advance Directive? _0  No No  Would patient like information on creating a medical advance directive? No - Patient declined No - Patient declined No - Patient declined No - Patient declined No - Patient declined No - Patient declined No - Patient declined    Current Medications (verified) Outpatient Encounter Medications as of 12/05/2020  Medication Sig   amLODipine (NORVASC) 10 MG tablet Take 1 tablet (10 mg total) by mouth daily.   aspirin EC 81 MG EC tablet Take 1 tablet (81 mg total) by mouth daily. Swallow whole.   atorvastatin (LIPITOR) 80 MG tablet Take 1 tablet (80 mg total) by mouth daily.   Blood Glucose Monitoring Suppl (ONE TOUCH ULTRA 2) w/Device KIT Use as directed E11.49   clopidogrel (PLAVIX) 75 MG tablet Take 1 tablet (75 mg total) by mouth daily.   Colchicine (MITIGARE) 0.6 MG CAPS Take 1 capsule by mouth daily as needed (gout flare).   colchicine 0.6 MG tablet TAKE 1 TABLET (0.6  MG TOTAL) BY MOUTH DAILY AS NEEDED (GOUT FLARE).   glucose blood (ONE TOUCH ULTRA TEST) test strip Use as instructed to check sugars BID E11.49   hydrochlorothiazide (HYDRODIURIL) 50 MG tablet Take 0.5 tablets (25 mg total) by mouth daily.   Lancets (ONETOUCH ULTRASOFT) lancets Use as instructed to check sugars BID E11.49   lisinopril (ZESTRIL) 40 MG tablet Take 1 tablet (40 mg total) by mouth daily.   metFORMIN (GLUCOPHAGE XR) 750 MG 24 hr tablet Take 1 tablet (750 mg total) by mouth daily with breakfast for 7 days, THEN 2 tablets (1,500 mg total) daily with breakfast.   metoprolol tartrate (LOPRESSOR) 25 MG tablet TAKE 1 TABLET BY MOUTH TWICE A DAY   potassium chloride (KLOR-CON) 10 MEQ tablet Take 1 tablet (10 mEq total) by mouth daily.   Semaglutide,0.25 or 0.5MG/DOS, (OZEMPIC, 0.25 OR 0.5 MG/DOSE,) 2 MG/1.5ML SOPN Inject 0.5 mg into the skin once a week.   No facility-administered encounter medications on file as of 12/05/2020.    Allergies (verified) Patient has no known allergies.   History: Past Medical History:  Diagnosis Date   Diabetes mellitus without complication ([place])    Gout 05/12/2018   Endorses longstanding dx   History of smoking    HTN (hypertension)    Hyperlipemia    Lacunar stroke of right subthalamic region [facility]) 11/30/2017   Obesity    Sleep apnea    Stroke [facility])    Wears partial dentures  Past Surgical History:  Procedure Laterality Date   MOUTH SURGERY     WISDOM TOOTH EXTRACTION     Family History  Problem Relation Age of Onset   Diabetes Mother    Hypertension Mother    Diabetes Maternal Grandmother    Hypertension Maternal Grandmother    Coronary artery disease Neg Hx    Stroke Neg Hx    Cancer Neg Hx    Social History   Socioeconomic History   Marital status: Married    Spouse name: Not on file   Number of children: Not on file   Years of education: Not on file   Highest education level: Not on file  Occupational History   Not on file   Tobacco Use   Smoking status: Every Day    Packs/day: 1.00    Years: 20.00    Pack years: 20.00    Types: Cigarettes   Smokeless tobacco: Never   Tobacco comments:    vaped for 5 years  Substance and Sexual Activity   Alcohol use: Yes    Alcohol/week: 1.0 standard drink    Types: 1 Shots of liquor per week    Comment: Occasional - not on a weekly basis   Drug use: No   Sexual activity: Not on file  Other Topics Concern   Not on file  Social History Narrative   Caffeine: sodas - 6 pack/day (pepsi, mt dew, ginger ale)   Lives with GF (April Blackwell), 1 cat.  Divorced.  Children 2.   Occupation: [employer], maintenance (IT)   Activity: [employer]   Diet: some water, rare fruits/vegetables      Disability evaluation 06/2019 from [place] scanned in chart - severe limitations as per note   Social Determinants of Health   Financial Resource Strain: Not on file  Food Insecurity: Not on file  Transportation Needs: Not on file  Physical Activity: Not on file  Stress: Not on file  Social Connections: Not on file    Tobacco Counseling Ready to quit: Not Answered Counseling given: Not Answered Tobacco comments: vaped for 5 years   Clinical Intake:                 Diabetic: Yes Nutrition Risk Assessment:  Has the patient had any N/V/D within the last 2 months?  {YES/NO:21197} Does the patient have any non-healing wounds?  {YES/NO:21197} Has the patient had any unintentional weight loss or weight gain?  {YES/NO:21197}  Diabetes:  Is the patient diabetic?  Yes  If diabetic, was a CBG obtained today?  No  telephone visit  Did the patient bring in their glucometer from home?  No  telephone visit How often do you monitor your CBG's? ***.   Financial Strains and Diabetes Management:  Are you having any financial strains with the device, your supplies or your medication? No .  Does the patient want to be seen by Chronic Care Management for  management of their diabetes?  No  Would the patient like to be referred to a Nutritionist or for Diabetic Management?  No   Diabetic Exams:  {Diabetic Eye Exam:2101801} Diabetic Foot Exam: Completed 10/25/2020          Activities of Daily Living In your present state of health, do you have any difficulty performing the following activities: 09/15/2020  Hearing? N  Vision? N  Difficulty concentrating or making decisions? N  Walking or climbing stairs? Y  Dressing or bathing? N  Doing errands, shopping? Y  Some recent data might be hidden    Patient Care Team: Ria Bush, MD as PCP - General (Family Medicine)  Indicate any recent Medical Services you may have received from other than [facility] providers in the past year (date may be approximate).     Assessment:   This is a routine wellness examination for Alejandro.  Hearing/Vision screen No results found.  Dietary issues and exercise activities discussed:     Goals Addressed   None    Depression Screen PHQ 2/9 Scores 12/06/2019 11/28/2018 01/17/2018  PHQ - 2 Score 2 2 0  PHQ- 9 Score 15 15 -    Fall Risk Fall Risk  01/17/2018  Falls in the past year? Yes  Comment due to stroke  Number falls in past yr: 1  Injury with Fall? No  Follow up Falls evaluation completed;Education provided    FALL RISK PREVENTION PERTAINING TO THE HOME:  Any stairs in or around the home? Yes  If so, are there any without handrails? No  Home free of loose throw rugs in walkways, pet beds, electrical cords, etc? Yes  Adequate lighting in your home to reduce risk of falls? Yes   ASSISTIVE DEVICES UTILIZED TO PREVENT FALLS:  Life alert? {YES/NO:21197} Use of a cane, walker or w/c? {YES/NO:21197} Grab bars in the bathroom? {YES/NO:21197} Shower chair or bench in shower? {YES/NO:21197} Elevated toilet seat or a handicapped toilet? {YES/NO:21197}  TIMED UP AND GO:  Was the test performed?  N/A telephone visit .    Cognitive  Function:       Mini Cog  Mini-Cog screen was completed. Maximum score is 22. A value of 0 denotes this part of the MMSE was not completed or the patient failed this part of the Mini-Cog screening.  Immunizations Immunization History  Administered Date(s) Administered   Influenza,inj,Quad PF,6+ Mos 01/14/2018   Pneumococcal Polysaccharide-23 01/14/2018   Tdap 06/20/2013    TDAP status: Up to date  Flu Vaccine status: due Fall 2022  Pneumococcal vaccine status: Up to date  {Covid-19 vaccine status:2101808}  Qualifies for Shingles Vaccine? Yes   Zostavax completed No   Shingrix Completed?: No.    Education has been provided regarding the importance of this vaccine. Patient has been advised to call insurance company to determine out of pocket expense if they have not yet received this vaccine. Advised may also receive vaccine at local pharmacy or Health Dept. Verbalized acceptance and understanding.  Screening Tests Health Maintenance  Topic Date Due   COVID-19 Vaccine (1) Never done   COLONOSCOPY (Pts 45-81yr Insurance coverage will need to be confirmed)  Never done   Zoster Vaccines- Shingrix (1 of 2) Never done   Pneumococcal Vaccine 024692Years old (2 - PCV) 01/15/2019   OPHTHALMOLOGY EXAM  02/04/2019   INFLUENZA VACCINE  12/09/2020   HEMOGLOBIN A1C  03/18/2021   FOOT EXAM  10/25/2021   TETANUS/TDAP  06/21/2023   PNEUMOCOCCAL POLYSACCHARIDE VACCINE AGE 22-64 HIGH RISK  Completed   Hepatitis C Screening  Completed   HIV Screening  Completed   HPV VACCINES  Aged Out    Health Maintenance  Health Maintenance Due  Topic Date Due   COVID-19 Vaccine (1) Never done   COLONOSCOPY (Pts 45-423yrInsurance coverage will need to be confirmed)  Never done   Zoster Vaccines- Shingrix (1 of 2) Never done   Pneumococcal Vaccine 0-23494ears old (2 - PCV) 01/15/2019   OPHTHALMOLOGY EXAM  02/04/2019    {  Colorectal cancer screening:2101809}  Lung Cancer Screening: (Low Dose CT  Chest recommended if Age 28-80 years, 30 pack-year currently smoking OR have quit w/in 15years.) does qualify.   Lung Cancer Screening Referral: ***  Additional Screening:  Hepatitis C Screening: does qualify; Completed 02/22/2018  Vision Screening: Recommended annual ophthalmology exams for early detection of glaucoma and other disorders of the eye. Is the patient up to date with their annual eye exam?  {YES/NO:21197} Who is the provider or what is the name of the office in which the patient attends annual eye exams? *** If pt is not established with a provider, would they like to be referred to a provider to establish care? No .   Dental Screening: Recommended annual dental exams for proper oral hygiene  Community Resource Referral / Chronic Care Management: CRR required this visit?  No   CCM required this visit?  No      Plan:     I have personally reviewed and noted the following in the patient's chart:   Medical and social history Use of alcohol, tobacco or illicit drugs  Current medications and supplements including opioid prescriptions. Patient is not currently taking opioid prescriptions. Functional ability and status Nutritional status Physical activity Advanced directives List of other physicians Hospitalizations, surgeries, and ER visits in previous 12 months Vitals Screenings to include cognitive, depression, and falls Referrals and appointments  In addition, I have reviewed and discussed with patient certain preventive protocols, quality metrics, and best practice recommendations. A written personalized care plan for preventive services as well as general preventive health recommendations were provided to patient.   Due to this being a telephonic visit, the after visit summary with patients personalized plan was offered to patient via office or my-chart. Patient preferred to pick up at office at next visit or via mychart.   Andrez Grime, LPN   [id number]

## 2020-12-06 ENCOUNTER — Other Ambulatory Visit: Payer: Self-pay | Admitting: Family Medicine

## 2020-12-06 ENCOUNTER — Encounter: Payer: Self-pay | Admitting: Family Medicine

## 2020-12-09 ENCOUNTER — Institutional Professional Consult (permissible substitution): Payer: Medicare Other | Admitting: Diagnostic Neuroimaging

## 2020-12-13 ENCOUNTER — Ambulatory Visit (INDEPENDENT_AMBULATORY_CARE_PROVIDER_SITE_OTHER): Payer: Medicare Other | Admitting: Family Medicine

## 2020-12-13 ENCOUNTER — Other Ambulatory Visit: Payer: Self-pay

## 2020-12-13 ENCOUNTER — Encounter: Payer: Self-pay | Admitting: Family Medicine

## 2020-12-13 VITALS — BP 154/94 | HR 73 | Temp 97.4°F | Ht 67.0 in | Wt 242.4 lb

## 2020-12-13 DIAGNOSIS — I6930 Unspecified sequelae of cerebral infarction: Secondary | ICD-10-CM

## 2020-12-13 DIAGNOSIS — Z136 Encounter for screening for cardiovascular disorders: Secondary | ICD-10-CM

## 2020-12-13 DIAGNOSIS — Z1211 Encounter for screening for malignant neoplasm of colon: Secondary | ICD-10-CM

## 2020-12-13 DIAGNOSIS — Z7189 Other specified counseling: Secondary | ICD-10-CM

## 2020-12-13 DIAGNOSIS — E1165 Type 2 diabetes mellitus with hyperglycemia: Secondary | ICD-10-CM

## 2020-12-13 DIAGNOSIS — IMO0002 Reserved for concepts with insufficient information to code with codable children: Secondary | ICD-10-CM

## 2020-12-13 DIAGNOSIS — E1149 Type 2 diabetes mellitus with other diabetic neurological complication: Secondary | ICD-10-CM

## 2020-12-13 DIAGNOSIS — Z0000 Encounter for general adult medical examination without abnormal findings: Secondary | ICD-10-CM | POA: Diagnosis not present

## 2020-12-13 DIAGNOSIS — I69319 Unspecified symptoms and signs involving cognitive functions following cerebral infarction: Secondary | ICD-10-CM | POA: Diagnosis not present

## 2020-12-13 DIAGNOSIS — F321 Major depressive disorder, single episode, moderate: Secondary | ICD-10-CM

## 2020-12-13 DIAGNOSIS — E1136 Type 2 diabetes mellitus with diabetic cataract: Secondary | ICD-10-CM

## 2020-12-13 DIAGNOSIS — E785 Hyperlipidemia, unspecified: Secondary | ICD-10-CM

## 2020-12-13 DIAGNOSIS — I69354 Hemiplegia and hemiparesis following cerebral infarction affecting left non-dominant side: Secondary | ICD-10-CM | POA: Diagnosis not present

## 2020-12-13 DIAGNOSIS — I10 Essential (primary) hypertension: Secondary | ICD-10-CM

## 2020-12-13 DIAGNOSIS — F17200 Nicotine dependence, unspecified, uncomplicated: Secondary | ICD-10-CM

## 2020-12-13 DIAGNOSIS — E1169 Type 2 diabetes mellitus with other specified complication: Secondary | ICD-10-CM

## 2020-12-13 MED ORDER — OZEMPIC (0.25 OR 0.5 MG/DOSE) 2 MG/1.5ML ~~LOC~~ SOPN: PEN_INJECTOR | SUBCUTANEOUS | 6 refills | Status: AC

## 2020-12-13 MED ORDER — EMPAGLIFLOZIN 25 MG PO TABS: 25.0000 mg | ORAL_TABLET | Freq: Every day | ORAL | 11 refills | Status: AC

## 2020-12-13 MED ORDER — METOPROLOL TARTRATE 25 MG PO TABS: 25.0000 mg | ORAL_TABLET | Freq: Two times a day (BID) | ORAL | 3 refills | Status: AC

## 2020-12-13 MED ORDER — AMLODIPINE BESYLATE 10 MG PO TABS: 10.0000 mg | ORAL_TABLET | Freq: Every day | ORAL | 3 refills | Status: AC

## 2020-12-13 MED ORDER — POTASSIUM CHLORIDE ER 10 MEQ PO TBCR: 10.0000 meq | EXTENDED_RELEASE_TABLET | Freq: Every day | ORAL | 3 refills | Status: AC

## 2020-12-13 MED ORDER — LISINOPRIL 40 MG PO TABS: 40.0000 mg | ORAL_TABLET | Freq: Every day | ORAL | 3 refills | Status: AC

## 2020-12-13 MED ORDER — HYDROCHLOROTHIAZIDE 50 MG PO TABS: 25.0000 mg | ORAL_TABLET | Freq: Every day | ORAL | 3 refills | Status: AC

## 2020-12-13 MED ORDER — CITALOPRAM HYDROBROMIDE 10 MG PO TABS: 10.0000 mg | ORAL_TABLET | Freq: Every day | ORAL | 3 refills | Status: AC

## 2020-12-13 MED ORDER — CLOPIDOGREL BISULFATE 75 MG PO TABS: 75.0000 mg | ORAL_TABLET | Freq: Every day | ORAL | 3 refills | Status: AC

## 2020-12-13 MED ORDER — METFORMIN HCL ER 750 MG PO TB24: 750.0000 mg | ORAL_TABLET | Freq: Every day | ORAL | 3 refills | Status: AC

## 2020-12-13 MED ORDER — ATORVASTATIN CALCIUM 80 MG PO TABS: 80.0000 mg | ORAL_TABLET | Freq: Every day | ORAL | 3 refills | Status: AC

## 2020-12-13 NOTE — Assessment & Plan Note (Addendum)
I have personally reviewed the Medicare Annual Wellness questionnaire and have noted 1. The patient's medical and social history 2. Their use of alcohol, tobacco or illicit drugs 3. Their current medications and supplements 4. The patient's functional ability including ADL's, fall risks, home safety risks and hearing or visual impairment. Cognitive function has been assessed and addressed as indicated.  5. Diet and physical activity 6. Evidence for depression or mood disorders The patients weight, height, BMI have been recorded in the chart. I have made referrals, counseling and provided education to the patient based on review of the above and I have provided the pt with a written personalized care plan for preventive services. Provider list updated.. See scanned questionairre as needed for further documentation. Reviewed preventative protocols and updated unless pt declined.  AAA screen ordered today.  Update EKG today

## 2020-12-13 NOTE — Progress Notes (Signed)
Per Dr. Sharen Hones, start pt on sample of Dexcome CBG monitor and educate.  Educated pt concerning placement of dexcom, downloading app, and how to care of dexcom. Advised how to set high and low alarms. Advised to contact insurance to inquire if Dexcom or Freestyle Thomas Howard would be covered under his plan. Pt had no further questions. Pt verbalized understanding.

## 2020-12-13 NOTE — Progress Notes (Signed)
Patient ID: Thomas Howard, male    DOB: 1960-08-18, 60 y.o.   MRN: [id number]  This visit was conducted in person.  BP (!) 154/94   Pulse 73   Temp (!) 97.4 F (36.3 C) (Temporal)   Ht _0  (1.702 m)   Wt 242 lb 6 oz (109.9 kg)   SpO2 97%   BMI 37.96 kg/m    CC: welcome to medicare visit  Subjective:   HPI: Thomas Howard is a 60 y.o. male presenting on 12/13/2020 for Welcome to Medicare Exam (Pt accompanied by wife, April- temp 97.7.)   Did not see health advisor this year.   Hearing Screening   500Hz 1000Hz 2000Hz 4000Hz  Right ear _1 Left ear _2 Vision Screening   Right eye Left eye Both eyes  Without correction 20/25 20/20 20/20  With correction       [place] [facility] Visit from 12/13/2020 in [facility]  PHQ-2 Total Score 5       Fall Risk  12/13/2020 01/17/2018  Falls in the past year? 1 Yes  Comment - due to stroke  Number falls in past yr: 1 1  Injury with Fall? 0 No  Follow up - Falls evaluation completed;Education provided  R handed Falls 09/2020 during stroke. Slipped down stairs this morning without injury   Medicare started 03/2020. Disability after lacunar stroke 09/2017 with residual neurological deficits (L hemiparesis). Recurrent CVAs, latest 09/2020. Plan was plavix + aspirin x 3 months then transition to plavix alone. Wife April drove him today.   Upcoming cardiology appointment next month.  Missed neurology appt 12/09/2020 ([place]).   Struggles with cost of medications - Jardiance/Ozempic assistance - ozempic was not received by pharmacy - refilled today. Jardiance $50/month - they continue this.   Has not had labs yet - not fasting today. Needs to return for fasting labwork.  DM - doesn't check sugars given needle aversion. They ask about dexcom meter.  OSA - not using CPAP machine - unable to tolerate.   Previously participated in casting (fishing) - but now no interest/motivation to continue  this. Also notes difficulty with ongoing blurry vision.   CCM - difficulty connecting with patient. Best to call wife April at with appointments [phone].   Preventative:  Colon cancer screening - has not completed. Agrees to try Cologuard Prostate cancer screening - discussed, would like to screen. Lung cancer screening - 20+ PY hx. Interested - will refer Flu shot - yearly  COVID vaccine - discussed, declines Pneumovax 01/2018 Tdap 06/2013 Shingrix - discussed, will check with insurance  Advanced directive: has at home. Wife April is HCPOA. Wants full code.  Seat belt use discussed Sunscreen use discussed. No changing moles on skin. Smoking - down to 1/2 ppd from 2ppd  Alcohol - rare  Dentist - due  Eye exam - DUE ([facility]) - will sign up today with our mobile retinopathy screening  Bowel - no constipation, loose stools from metformin Bladder - no incontinence    Caffeine: has cut down on sodas  Lives with GF, 1 cat. 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 Interim medical history since our last visit reviewed. Allergies and medications reviewed and updated. Outpatient Medications Prior to Visit  Medication Sig Dispense Refill   aspirin EC 81 MG EC tablet Take 1 tablet (81 mg total) by mouth daily. Swallow whole. 90 tablet 0   Blood Glucose Monitoring Suppl (ONE TOUCH ULTRA 2) w/Device KIT Use as directed E11.49 1 each 0   Colchicine (MITIGARE) 0.6 MG CAPS Take 1 capsule by mouth daily as needed (gout flare). 30 capsule 3   colchicine 0.6 MG tablet TAKE 1 TABLET (0.6 MG TOTAL) BY MOUTH DAILY AS NEEDED (GOUT FLARE). 30 tablet 1   glucose blood (ONE TOUCH ULTRA TEST) test strip Use as instructed to check sugars BID E11.49 100 each 3   Lancets (ONETOUCH ULTRASOFT)  lancets Use as instructed to check sugars BID E11.49 100 each 3   amLODipine (NORVASC) 10 MG tablet Take 1 tablet (10 mg total) by mouth daily. 90 tablet 3   atorvastatin (LIPITOR) 80 MG tablet Take 1 tablet (80 mg total) by mouth daily. 90 tablet 3   clopidogrel (PLAVIX) 75 MG tablet Take 1 tablet (75 mg total) by mouth daily. 90 tablet 3   hydrochlorothiazide (HYDRODIURIL) 50 MG tablet Take 0.5 tablets (25 mg total) by mouth daily. 45 tablet 3   lisinopril (ZESTRIL) 40 MG tablet Take 1 tablet (40 mg total) by mouth daily. 90 tablet 3   metoprolol tartrate (LOPRESSOR) 25 MG tablet TAKE 1 TABLET BY MOUTH TWICE A DAY 60 tablet 0   potassium chloride (KLOR-CON) 10 MEQ tablet Take 1 tablet (10 mEq total) by mouth daily. 90 tablet 3   metFORMIN (GLUCOPHAGE XR) 750 MG 24 hr tablet Take 1 tablet (750 mg total) by mouth daily with breakfast for 7 days, THEN 2 tablets (1,500 mg total) daily with breakfast. 180 tablet 3   Semaglutide,0.25 or 0.5MG/DOS, (OZEMPIC, 0.25 OR 0.5 MG/DOSE,) 2 MG/1.5ML SOPN Inject 0.5 mg into the skin once a week. (Patient not taking: Reported on 12/13/2020) 1.5 mL    No facility-administered medications prior to visit.     Per HPI unless specifically indicated in ROS section below Review of Systems  Constitutional:  Negative for activity change, appetite change, chills, fatigue, fever and unexpected weight change.  HENT:  Negative for hearing loss.   Eyes:  Negative for visual disturbance.  Respiratory:  Positive for cough and choking (chronic since stroke). Negative for chest tightness, shortness of breath and wheezing.   Cardiovascular:  Negative for chest pain, palpitations and leg swelling.  Gastrointestinal:  Positive for diarrhea. Negative for abdominal distention, abdominal pain, blood in stool, constipation, nausea and vomiting.  Genitourinary:  Negative for difficulty urinating and hematuria.  Musculoskeletal:  Negative for arthralgias, myalgias and neck pain.  Skin:   Negative for rash.  Neurological:  Negative for dizziness, seizures, syncope and headaches.  Hematological:  Negative for adenopathy. Does not bruise/bleed easily.  Psychiatric/Behavioral:  Positive for dysphoric mood. The patient is nervous/anxious.    Objective:  BP (!) 154/94   Pulse 73   Temp (!) 97.4 F (36.3 C) (Temporal)   Ht _0  (1.702 m)   Wt 242 lb 6 oz (109.9 kg)   SpO2 97%   BMI 37.96 kg/m   Wt Readings from Last 3 Encounters:  12/13/20 242 lb 6 oz (109.9 kg)  10/25/20 244 lb 9 oz (110.9 kg)  09/24/20 249 lb 9 oz (113.2 kg)      Physical Exam Vitals and nursing note reviewed.  Constitutional:      General: He is not in acute distress.    Appearance: Normal appearance.  He is well-developed. He is not ill-appearing.  HENT:     Head: Normocephalic and atraumatic.     Right Ear: Hearing, tympanic membrane, ear canal and external ear normal.     Left Ear: Hearing, tympanic membrane, ear canal and external ear normal.  Eyes:     General: No scleral icterus.    Extraocular Movements: Extraocular movements intact.     Conjunctiva/sclera: Conjunctivae normal.     Pupils: Pupils are equal, round, and reactive to light.  Neck:     Thyroid: No thyroid mass or thyromegaly.     Vascular: No carotid bruit.  Cardiovascular:     Rate and Rhythm: Normal rate and regular rhythm.     Pulses: Normal pulses.          Radial pulses are 2+ on the right side and 2+ on the left side.     Heart sounds: Normal heart sounds. No murmur heard. Pulmonary:     Effort: Pulmonary effort is normal. No respiratory distress.     Breath sounds: Normal breath sounds. No wheezing, rhonchi or rales.  Abdominal:     General: Bowel sounds are normal. There is no distension.     Palpations: Abdomen is soft. There is no mass.     Tenderness: There is no abdominal tenderness. There is no guarding or rebound.     Hernia: No hernia is present.  Musculoskeletal:        General: Normal range of  motion.     Cervical back: Normal range of motion and neck supple.     Right lower leg: No edema.     Left lower leg: No edema.  Lymphadenopathy:     Cervical: No cervical adenopathy.  Skin:    General: Skin is warm and dry.     Findings: No rash.  Neurological:     General: No focal deficit present.     Mental Status: He is alert and oriented to person, place, and time.     Comments:  Recall 1/3, 2/3 with cue Calculation 1/5 DR  Psychiatric:        Mood and Affect: Mood normal.        Behavior: Behavior normal.        Thought Content: Thought content normal.        Judgment: Judgment normal.      Results for orders placed or performed in visit on 04/54/09  Basic metabolic panel  Result Value Ref Range   Sodium 138 135 - 145 mEq/L   Potassium 3.9 3.5 - 5.1 mEq/L   Chloride 101 96 - 112 mEq/L   CO2 29 19 - 32 mEq/L   Glucose, Bld 167 (H) 70 - 99 mg/dL   BUN 14 6 - 23 mg/dL   Creatinine, Ser 1.24 0.40 - 1.50 mg/dL   GFR 64.41 >60.00 mL/min   Calcium 9.5 8.4 - 10.5 mg/dL  Fructosamine  Result Value Ref Range   Fructosamine 381 (H) 205 - 285 umol/L   Lab Results  Component Value Date   HGBA1C 9.8 (H) 09/15/2020    EKG - NSR rate 60s, normal axis, intervals, LA abnormality with ?p mitrale, inverted T waves inferiorly with nonspecific ST changes diffusely, unchanged from prior EKG  Assessment & Plan:  This visit occurred during the SARS-CoV-2 public health emergency.  Safety protocols were in place, including screening questions prior to the visit, additional usage of staff PPE, and extensive cleaning of exam room while observing appropriate contact time  as indicated for disinfecting solutions.   Problem List Items Addressed This Visit     Welcome to Medicare preventive visit - Primary (Chronic)    I have personally reviewed the Medicare Annual Wellness questionnaire and have noted 1. The patient's medical and social history 2. Their use of alcohol, tobacco or illicit  drugs 3. Their current medications and supplements 4. The patient's functional ability including ADL's, fall risks, home safety risks and hearing or visual impairment. Cognitive function has been assessed and addressed as indicated.  5. Diet and physical activity 6. Evidence for depression or mood disorders The patients weight, height, BMI have been recorded in the chart. I have made referrals, counseling and provided education to the patient based on review of the above and I have provided the pt with a written personalized care plan for preventive services. Provider list updated.. See scanned questionairre as needed for further documentation. Reviewed preventative protocols and updated unless pt declined.  AAA screen ordered today.  Update EKG today       Relevant Orders   EKG 12-Lead (Completed)   Advanced directives, counseling/discussion (Chronic)    Advanced directive: has at home. Wife April is HCPOA. Wants full code.        Hypertension    Chronic, uncontrolled - no changes made today.  Continue amlodipine, hctz 34m, lisinopril 447mdaily and metoprolol 256mid.        Relevant Medications   hydrochlorothiazide (HYDRODIURIL) 50 MG tablet   atorvastatin (LIPITOR) 80 MG tablet   amLODipine (NORVASC) 10 MG tablet   lisinopril (ZESTRIL) 40 MG tablet   metoprolol tartrate (LOPRESSOR) 25 MG tablet   Severe obesity (BMI 35.0-39.9) with comorbidity ([place]CC[place]  Encourage ongoing weight loss efforts.        Relevant Medications   Semaglutide,0.25 or 0.5MG/DOS, (OZEMPIC, 0.25 OR 0.5 MG/DOSE,) 2 MG/1.5ML SOPN   metFORMIN (GLUCOPHAGE XR) 750 MG 24 hr tablet   empagliflozin (JARDIANCE) 25 MG TABS tablet   History of stroke with residual deficit    Recent recurrent stroke currently on aspirin + plavix plan was for 3 months which he should be completing now. Missed neuro f/u - I asked them to call and reschedule.        Smoker    Continues cutting down - to 1/2 ppd from 2ppd.   Interested in lung cancer screening - will refer.        Relevant Orders   Ambulatory Referral for Lung Cancer Scre   Uncontrolled type 2 diabetes mellitus with neurologic complication, without long-term current use of insulin (HCC[place]  Remaining uncontrolled despite metformin, jardiance.  Has not taken ozempic - refilled to pharmacy.  In process of receiving patient assistance on meds.  He doesn't check sugars at home - needle aversion.  He is interested in trial of continuous glucose monitor - I asked Sha[place]e [place] to discuss this and he was able to be sent home with Dexcom trial. Likely would not qualify for insurance coverage for CGM however.        Relevant Medications   Semaglutide,0.25 or 0.5MG/DOS, (OZEMPIC, 0.25 OR 0.5 MG/DOSE,) 2 MG/1.5ML SOPN   atorvastatin (LIPITOR) 80 MG tablet   lisinopril (ZESTRIL) 40 MG tablet   metFORMIN (GLUCOPHAGE XR) 750 MG 24 hr tablet   empagliflozin (JARDIANCE) 25 MG TABS tablet   Other Relevant Orders   Hemoglobin A1c   Hemiplegia and hemiparesis following cerebral infarction affecting left non-dominant side (HCC)    Residual L  hemiparesis Struggles with ongoing limitations       Cognitive deficit as late effect of cerebrovascular accident (CVA)   MDD (major depressive disorder), single episode, moderate ([place])    Ongoing depression/anhedonia due to limitations post stroke.  Agrees to restart celexa 10mg  daily.        Relevant Medications   citalopram (CELEXA) 10 MG tablet   Diabetic cataract of both eyes ([place])    Overdue for diabetic eye exam.  I asked him to get scheduled on one of our mobile retinopathy unit days.        Relevant Medications   Semaglutide,0.25 or 0.5MG /DOS, (OZEMPIC, 0.25 OR 0.5 MG/DOSE,) 2 MG/1.5ML SOPN   atorvastatin (LIPITOR) 80 MG tablet   lisinopril (ZESTRIL) 40 MG tablet   metFORMIN (GLUCOPHAGE XR) 750 MG 24 hr tablet   empagliflozin (JARDIANCE) 25 MG TABS tablet   Hyperlipidemia associated with type 2  diabetes mellitus (HCC)    Chronic, continue atorva 80mg  daily.  The ASCVD Risk score Mikey Bussing DC Jr., et al., 2013) failed to calculate for the following reasons:   The patient has a prior MI or stroke diagnosis        Relevant Medications   Semaglutide,0.25 or 0.5MG /DOS, (OZEMPIC, 0.25 OR 0.5 MG/DOSE,) 2 MG/1.5ML SOPN   atorvastatin (LIPITOR) 80 MG tablet   lisinopril (ZESTRIL) 40 MG tablet   metFORMIN (GLUCOPHAGE XR) 750 MG 24 hr tablet   empagliflozin (JARDIANCE) 25 MG TABS tablet   Other Visit Diagnoses     Special screening for malignant neoplasms, colon       Relevant Orders   Cologuard   Screening for abdominal aortic aneurysm       Relevant Orders   VAS US AORTA MEDICARE SCREEN        Meds ordered this encounter  Medications   hydrochlorothiazide (HYDRODIURIL) 50 MG tablet    Sig: Take 0.5 tablets (25 mg total) by mouth daily.    Dispense:  45 tablet    Refill:  3   Semaglutide,0.25 or 0.5MG /DOS, (OZEMPIC, 0.25 OR 0.5 MG/DOSE,) 2 MG/1.5ML SOPN    Sig: Inject 0.25 mg into the skin once a week for 14 days, THEN 0.5 mg once a week.    Dispense:  1.5 mL    Refill:  6   atorvastatin (LIPITOR) 80 MG tablet    Sig: Take 1 tablet (80 mg total) by mouth daily.    Dispense:  90 tablet    Refill:  3   amLODipine (NORVASC) 10 MG tablet    Sig: Take 1 tablet (10 mg total) by mouth daily.    Dispense:  90 tablet    Refill:  3   clopidogrel (PLAVIX) 75 MG tablet    Sig: Take 1 tablet (75 mg total) by mouth daily.    Dispense:  90 tablet    Refill:  3   lisinopril (ZESTRIL) 40 MG tablet    Sig: Take 1 tablet (40 mg total) by mouth daily.    Dispense:  90 tablet    Refill:  3   metoprolol tartrate (LOPRESSOR) 25 MG tablet    Sig: Take 1 tablet (25 mg total) by mouth 2 (two) times daily.    Dispense:  180 tablet    Refill:  3   metFORMIN (GLUCOPHAGE XR) 750 MG 24 hr tablet    Sig: Take 1 tablet (750 mg total) by mouth daily with breakfast.    Dispense:  90 tablet     Refill:  3  potassium chloride (KLOR-CON) 10 MEQ tablet    Sig: Take 1 tablet (10 mEq total) by mouth daily.    Dispense:  90 tablet    Refill:  3   empagliflozin (JARDIANCE) 25 MG TABS tablet    Sig: Take 1 tablet (25 mg total) by mouth daily.    Dispense:  30 tablet    Refill:  11   citalopram (CELEXA) 10 MG tablet    Sig: Take 1 tablet (10 mg total) by mouth daily.    Dispense:  90 tablet    Refill:  3   Orders Placed This Encounter  Procedures   Hemoglobin A1c    Standing Status:   Future    Standing Expiration Date:   12/13/2021   Cologuard   Ambulatory Referral for Lung Cancer Scre    Referral Priority:   Routine    Referral Type:   Consultation    Referral Reason:   Specialty Services Required    Number of Visits Requested:   1   EKG 12-Lead    Patient instructions: Return next week fasting for labwork.  EKG today  We will set you up for abdominal aneurysm screen.  I will ask out team to look into setting you up with trial home dexcom (continuous glucose monitor).  We will refer you for lung cancer screening program [facility])  If interested, check with pharmacy about new 2 shot shingles series (shingrix).  Stop up front to sign up for one of our upcoming diabetic eye exam days.  Bring [place] copy of your living will.  Start celexa 6m daily - this should help with mood.  Call Dr PGladstone Lighteroffice to see if you can reschedule your neurology appointment.   Follow up plan: Return in about 6 months (around 06/15/2021), or if symptoms worsen or fail to improve, for follow up visit.  JRia Bush MD

## 2020-12-13 NOTE — Patient Instructions (Addendum)
Return next week fasting for labwork.  EKG today  We will set you up for abdominal aneurysm screen.  I will ask out team to look into setting you up with trial home dexcom (continuous glucose monitor).  We will refer you for lung cancer screening program [facility])  If interested, check with pharmacy about new 2 shot shingles series (shingrix).  Stop up front to sign up for one of our upcoming diabetic eye exam days.  Bring [place] copy of your living will.  Start celexa 10mg  daily - this should help with mood.  Call Dr office to see if you can reschedule your neurology appointment.   Health Maintenance, Male Adopting a healthy lifestyle and getting preventive care are important in promoting health and wellness. Ask your health care provider about: The right schedule for you to have regular tests and exams. Things you can do on your own to prevent diseases and keep yourself healthy. What should I know about diet, weight, and exercise? Eat a healthy diet  Eat a diet that includes plenty of vegetables, fruits, low-fat dairy products, and lean protein. Do not eat a lot of foods that are high in solid fats, added sugars, or sodium.  Maintain a healthy weight Body mass index (BMI) is a measurement that can be used to identify possible weight problems. It estimates body fat based on height and weight. Your health care provider can help determine your BMI and help you achieve or maintain ahealthy weight. Get regular exercise Get regular exercise. This is one of the most important things you can do for your health. Most adults should: Exercise for at least 150 minutes each week. The exercise should increase your heart rate and make you sweat (moderate-intensity exercise). Do strengthening exercises at least twice a week. This is in addition to the moderate-intensity exercise. Spend less time sitting. Even light physical activity can be beneficial. Watch cholesterol and blood lipids Have  your blood tested for lipids and cholesterol at 58 years of age, then havethis test every 5 years. You may need to have your cholesterol levels checked more often if: Your lipid or cholesterol levels are high. You are older than 58 years of age. You are at high risk for heart disease. What should I know about cancer screening? Many types of cancers can be detected early and may often be prevented. Depending on your health history and family history, you may need to have cancer screening at various ages. This may include screening for: Colorectal cancer. Prostate cancer. Skin cancer. Lung cancer. What should I know about heart disease, diabetes, and high blood pressure? Blood pressure and heart disease High blood pressure causes heart disease and increases the risk of stroke. This is more likely to develop in people who have high blood pressure readings, are of African descent, or are overweight. Talk with your health care provider about your target blood pressure readings. Have your blood pressure checked: Every 3-5 years if you are 13-40 years of age. Every year if you are 40 years old or older. If you are between the ages of 49 and 27 and are a current or former smoker, ask your health care provider if you should have a one-time screening for abdominal aortic aneurysm (AAA). Diabetes Have regular diabetes screenings. This checks your fasting blood sugar level. Have the screening done: Once every three years after age 76 if you are at a normal weight and have a low risk for diabetes. More often and at a  younger age if you are overweight or have a high risk for diabetes. What should I know about preventing infection? Hepatitis B If you have a higher risk for hepatitis B, you should be screened for this virus. Talk with your health care provider to find out if you are at risk forhepatitis B infection. Hepatitis C Blood testing is recommended for: Everyone born from [birth year] through  1963. Anyone with known risk factors for hepatitis C. Sexually transmitted infections (STIs) You should be screened each year for STIs, including gonorrhea and chlamydia, if: You are sexually active and are younger than 58 years of age. You are older than 58 years of age and your health care provider tells you that you are at risk for this type of infection. Your sexual activity has changed since you were last screened, and you are at increased risk for chlamydia or gonorrhea. Ask your health care provider if you are at risk. Ask your health care provider about whether you are at high risk for HIV. Your health care provider may recommend a prescription medicine to help prevent HIV infection. If you choose to take medicine to prevent HIV, you should first get tested for HIV. You should then be tested every 3 months for as long as you are taking the medicine. Follow these instructions at home: Lifestyle Do not use any products that contain nicotine or tobacco, such as cigarettes, e-cigarettes, and chewing tobacco. If you need help quitting, ask your health care provider. Do not use street drugs. Do not share needles. Ask your health care provider for help if you need support or information about quitting drugs. Alcohol use Do not drink alcohol if your health care provider tells you not to drink. If you drink alcohol: Limit how much you have to 0-2 drinks a day. Be aware of how much alcohol is in your drink. In the [place] one drink equals one 12 oz bottle of beer (355 mL), one 5 oz glass of wine (148 mL), or one 1 oz glass of hard liquor (44 mL). General instructions Schedule regular health, dental, and eye exams. Stay current with your vaccines. Tell your health care provider if: You often feel depressed. You have ever been abused or do not feel safe at home. Summary Adopting a healthy lifestyle and getting preventive care are important in promoting health and wellness. Follow your health  care provider's instructions about healthy diet, exercising, and getting tested or screened for diseases. Follow your health care provider's instructions on monitoring your cholesterol and blood pressure. This information is not intended to replace advice given to you by your health care provider. Make sure you discuss any questions you have with your healthcare provider. Document Revised: 04/20/2018 Document Reviewed: 04/20/2018 Elsevier Patient Education  2022 [employer].

## 2020-12-16 DIAGNOSIS — Z7189 Other specified counseling: Secondary | ICD-10-CM | POA: Insufficient documentation

## 2020-12-16 NOTE — Assessment & Plan Note (Addendum)
Recent recurrent stroke currently on aspirin + plavix plan was for 3 months which he should be completing now. Missed neuro f/u - I asked them to call and reschedule.

## 2020-12-16 NOTE — Assessment & Plan Note (Signed)
Continues cutting down - to 1/2 ppd from 2ppd.  Interested in lung cancer screening - will refer.

## 2020-12-16 NOTE — Assessment & Plan Note (Signed)
Advanced directive: has at home. Wife April is HCPOA. Wants full code.

## 2020-12-16 NOTE — Assessment & Plan Note (Signed)
Ongoing depression/anhedonia due to limitations post stroke.  Agrees to restart celexa 10mg  daily.

## 2020-12-16 NOTE — Assessment & Plan Note (Addendum)
Residual L hemiparesis Struggles with ongoing limitations

## 2020-12-16 NOTE — Assessment & Plan Note (Addendum)
Remaining uncontrolled despite metformin, jardiance.  Has not taken ozempic - refilled to pharmacy.  In process of receiving patient assistance on meds.  He doesn't check sugars at home - needle aversion.  He is interested in trial of continuous glucose monitor - I asked Carollee Herter RN to discuss this and he was able to be sent home with Dexcom trial. Likely would not qualify for insurance coverage for CGM however.

## 2020-12-16 NOTE — Assessment & Plan Note (Signed)
Chronic, continue atorva 80mg  daily.  The ASCVD Risk score DC Jr., et al., 2013) failed to calculate for the following reasons:   The patient has a prior MI or stroke diagnosis

## 2020-12-16 NOTE — Assessment & Plan Note (Signed)
Overdue for diabetic eye exam.  I asked him to get scheduled on one of our mobile retinopathy unit days.

## 2020-12-16 NOTE — Assessment & Plan Note (Signed)
Encourage ongoing weight loss efforts.

## 2020-12-16 NOTE — Assessment & Plan Note (Signed)
Chronic, uncontrolled - no changes made today.  Continue amlodipine, hctz 50mg , lisinopril 40mg  daily and metoprolol 25mg  bid.

## 2021-01-08 LAB — HM DIABETES EYE EXAM

## 2021-01-16 ENCOUNTER — Encounter: Payer: Self-pay | Admitting: Family Medicine

## 2021-01-16 ENCOUNTER — Telehealth: Payer: Self-pay

## 2021-01-16 NOTE — Telephone Encounter (Signed)
Received faxed DM eye exam results showing no retinopathy.  Abstracted results and sent for scanning.   Spoke with pt/pt's wife, April, of results.  Verbalizes understanding.

## 2021-01-22 ENCOUNTER — Ambulatory Visit (HOSPITAL_BASED_OUTPATIENT_CLINIC_OR_DEPARTMENT_OTHER): Payer: Medicare Other | Admitting: Internal Medicine

## 2021-01-22 ENCOUNTER — Encounter (HOSPITAL_BASED_OUTPATIENT_CLINIC_OR_DEPARTMENT_OTHER): Payer: Self-pay

## 2021-01-27 ENCOUNTER — Encounter: Payer: Self-pay | Admitting: Internal Medicine

## 2021-02-24 ENCOUNTER — Encounter: Payer: Self-pay | Admitting: Diagnostic Neuroimaging

## 2021-02-24 ENCOUNTER — Institutional Professional Consult (permissible substitution): Payer: Medicare Other | Admitting: Diagnostic Neuroimaging

## 2021-06-13 ENCOUNTER — Other Ambulatory Visit: Payer: Medicare Other

## 2021-06-20 ENCOUNTER — Ambulatory Visit: Payer: Medicare Other | Admitting: Family Medicine

## 2021-08-07 ENCOUNTER — Telehealth: Payer: Self-pay | Admitting: *Deleted

## 2021-08-07 NOTE — Telephone Encounter (Signed)
I called patient to let him know about the new research study called Prevail to lower LDL.I left message for him and sent him e-mail. ?

## 2021-09-12 ENCOUNTER — Encounter: Payer: Self-pay | Admitting: Family Medicine

## 2021-09-12 NOTE — Progress Notes (Signed)
Unable to contact patient to schedule Medicare Aorta Screen, letter sent, order removed.

## 2021-09-26 IMAGING — MR MR HEAD W/O CM
10 of 12 series · 32 of 48 positions shown · non-contrast
Comparison: None.

CLINICAL DATA: Left-sided weakness and dizziness

EXAM:
MRI HEAD WITHOUT CONTRAST
MRA HEAD WITHOUT CONTRAST
TECHNIQUE: Multiplanar, multiecho pulse sequences of the brain and surrounding
structures were obtained without intravenous contrast. Angiographic
images of the head were obtained using MRA technique without
contrast.

[Series 5: ax dwi_tracew · axial · 3.0mm · 0.65mm/px · z∈[-69,+86]mm · 4 of 48 slices shown]
[im 1/48]
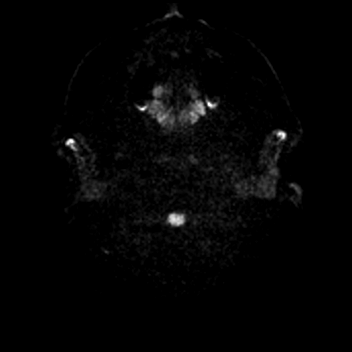
[im 16/48]
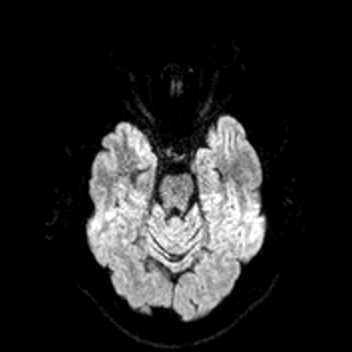
[im 32/48]
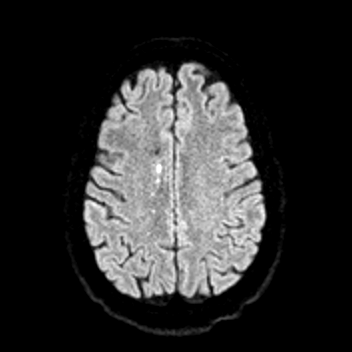
[im 48/48]
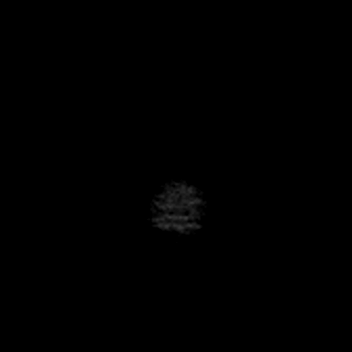

[Series 6: ax dwi_adc · axial · 3.0mm · 0.65mm/px · z∈[-69,+86]mm · 3 of 48 slices shown]
[im 1/48]
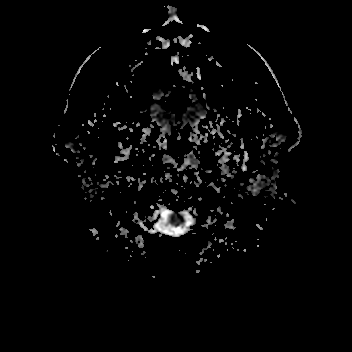
[im 24/48]
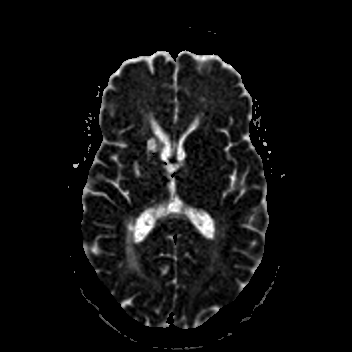
[im 48/48]
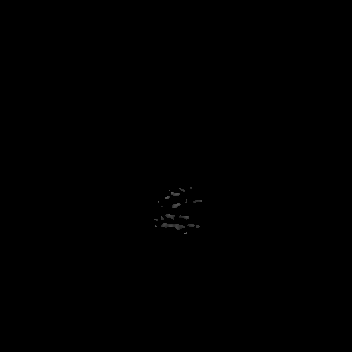

[Series 7: cor dwi_tracew · coronal · 5.0mm · 0.60mm/px · 2 of 40 slices shown]
[im 1/40]
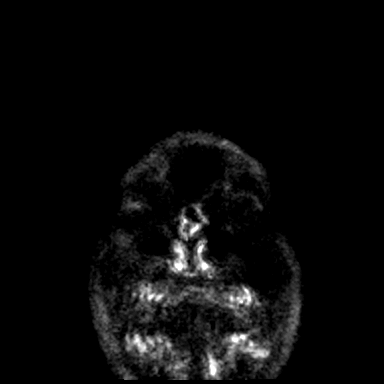
[im 40/40]
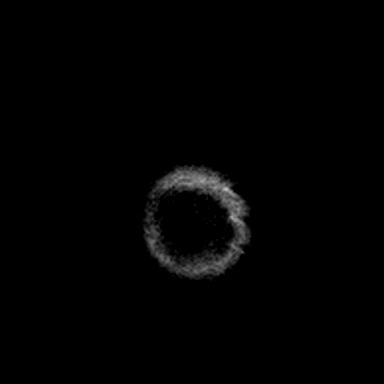

[Series 8: cor dwi_adc · coronal · 5.0mm · 0.60mm/px · 2 of 40 slices shown]
[im 1/40]
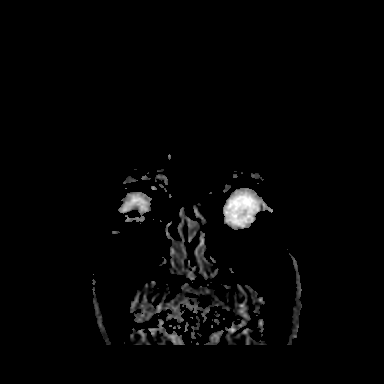
[im 40/40]
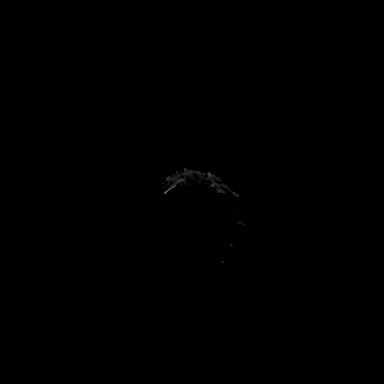

[Series 9: T1 · sagittal · 5.0mm · 0.62mm/px · 1 of 21 slices shown (1 of 2)]
[im 1/21]
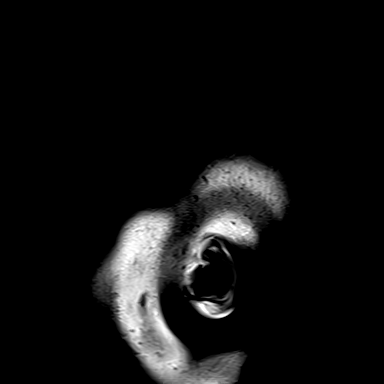

[Series 10: T2 · axial · 5.0mm · 0.45mm/px · z∈[-69,+87]mm · 2 of 27 slices shown (1 of 2)]
[im 1/27]
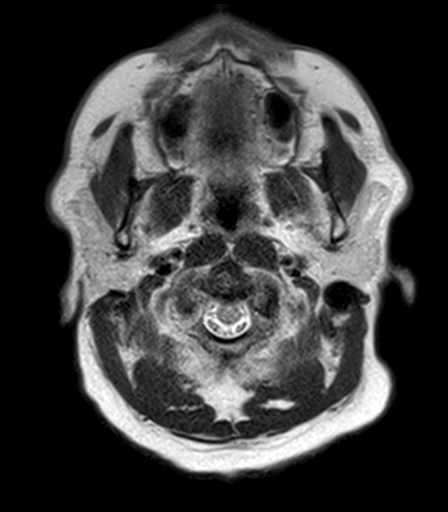
[im 27/27]
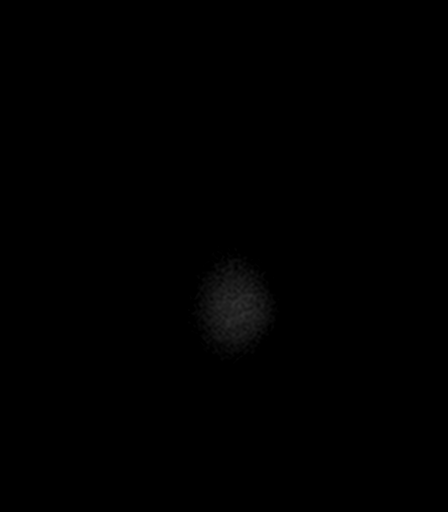

[Series 11: TOF · axial · 0.5mm · 0.41mm/px · z∈[-54,+5]mm · 5 of 205 slices shown]
[im 1/205]
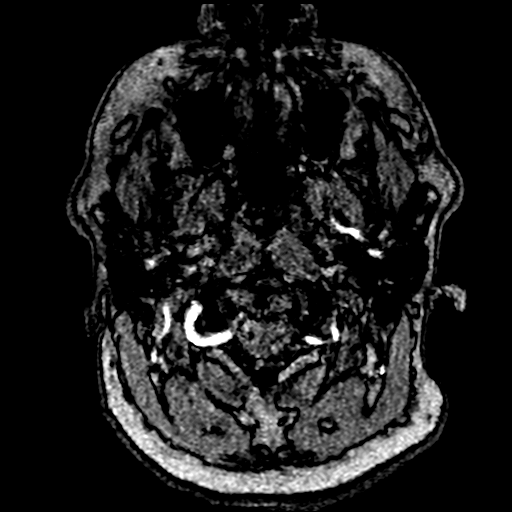
[im 35/205]
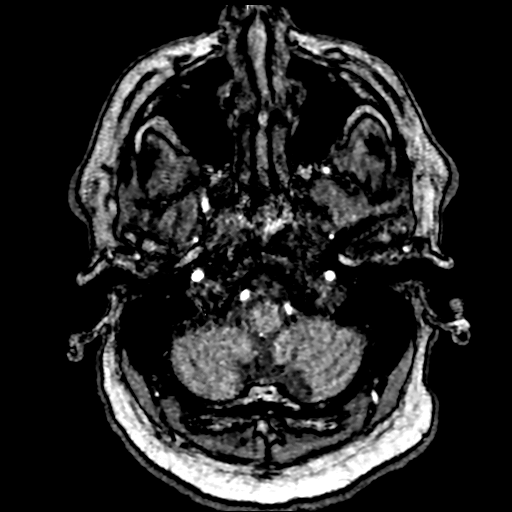
[im 69/205]
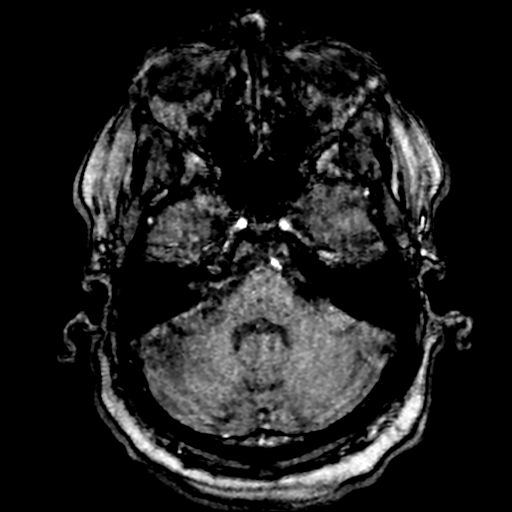
[im 86/205]
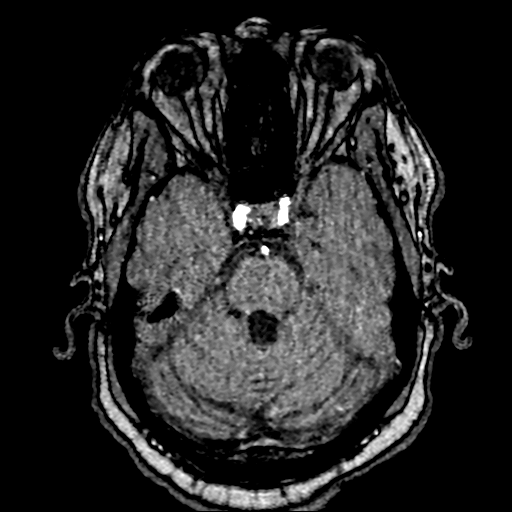
[im 120/205]
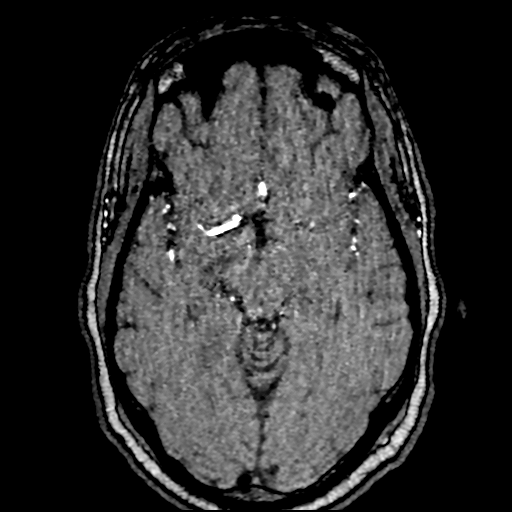

[Series 20: FLAIR · axial · 3.0mm · 0.53mm/px · z∈[-72,+90]mm · 3 of 55 slices shown]
[im 1/55]
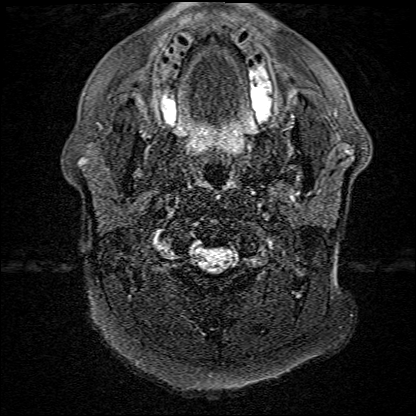
[im 28/55]
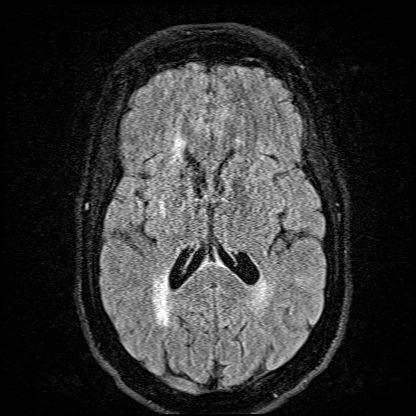
[im 55/55]
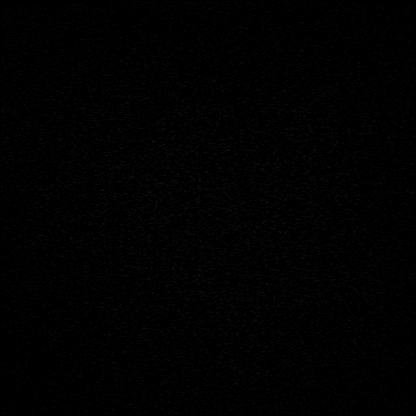

[Series 21: T1 · axial · 1.0mm · 0.98mm/px · z∈[-70,+89]mm · 8 of 160 slices shown (2 of 2)]
[im 1/160]
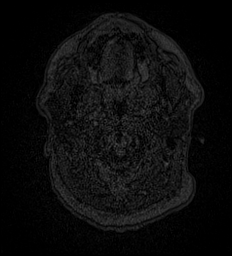
[im 18/160]
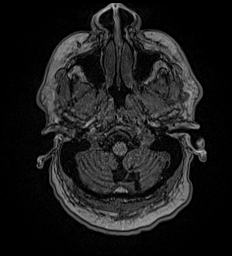
[im 54/160]
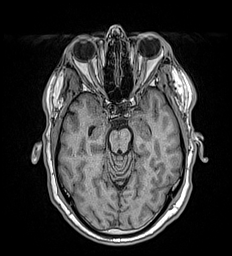
[im 71/160]
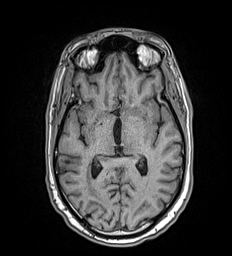
[im 89/160]
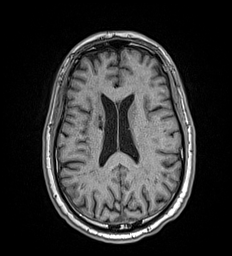
[im 107/160]
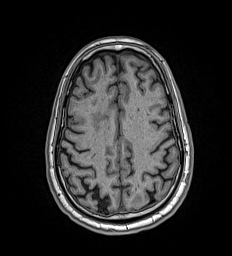
[im 142/160]
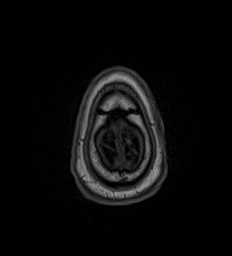
[im 160/160]
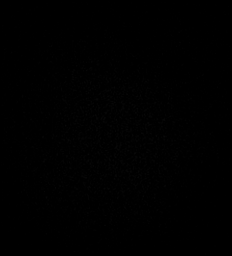

[Series 22: T2 · coronal · 5.0mm · 0.45mm/px · 2 of 33 slices shown (2 of 2)]
[im 1/33]
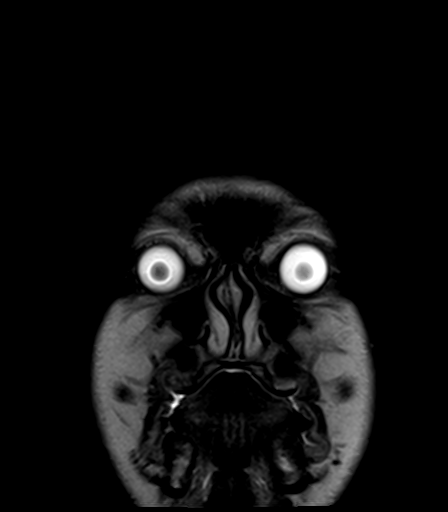
[im 33/33]
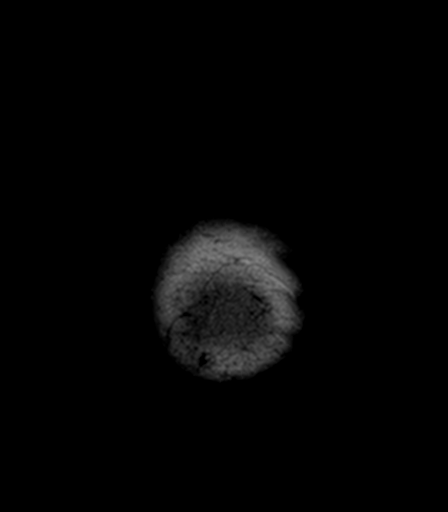

[32 of 48 positions shown; findings below may reference images not displayed]

FINDINGS: MRI HEAD FINDINGS

Brain: There is multifocal abnormal diffusion restriction in the
medial right hemisphere. No contralateral diffusion abnormality.
There are old infarcts of the left cerebellum and right basal
ganglia. No acute or chronic hemorrhage. There is multifocal
hyperintense T2-weighted signal within the white matter. Parenchymal
volume and CSF spaces are normal. The midline structures are normal.

Vascular: Major flow voids are preserved.

Skull and upper cervical spine: Normal calvarium and skull base.
Visualized upper cervical spine and soft tissues are normal.

Sinuses/Orbits:No paranasal sinus fluid levels or advanced mucosal
thickening. No mastoid or middle ear effusion. Normal orbits.

MRA HEAD FINDINGS

POSTERIOR CIRCULATION:

--Vertebral arteries: Normal

--Inferior cerebellar arteries: Normal.

--Basilar artery: Normal.

--Superior cerebellar arteries: Normal.

--Posterior cerebral arteries: Normal.

ANTERIOR CIRCULATION:

--Intracranial internal carotid arteries: Normal.

--Anterior cerebral arteries (ACA): Atherosclerotic irregularity of
the right ACA. Absent left A1 segment, normal variant

--Middle cerebral arteries (MCA): Normal.

ANATOMIC VARIANTS: None
IMPRESSION: 1. Multifocal acute ischemia within the medial right hemisphere. No
hemorrhage or mass effect.
2. Atherosclerotic irregularity of the right anterior cerebral
artery.
3. Old infarcts of the left cerebellum and right basal ganglia.

## 2021-12-16 ENCOUNTER — Telehealth: Payer: Self-pay | Admitting: Family Medicine

## 2021-12-16 NOTE — Telephone Encounter (Signed)
Copied from CRM [id number]. Topic: Medicare AWV >> Dec 16, 2021 11:07 AM Zannie Kehr wrote: Reason for CRM:  Left message for patient to call back and schedule Medicare Annual Wellness Visit (AWV).   Please offer to do virtually or by telephone.   Last AWV: 12/13/2020  Please schedule at anytime with [facility] Advisor schedule    45 minute appointent  If any questions, please contact me at [phone]

## 2021-12-22 DIAGNOSIS — I361 Nonrheumatic tricuspid (valve) insufficiency: Secondary | ICD-10-CM | POA: Diagnosis not present

## 2021-12-22 DIAGNOSIS — R4701 Aphasia: Secondary | ICD-10-CM | POA: Diagnosis not present

## 2021-12-22 DIAGNOSIS — R9431 Abnormal electrocardiogram [ECG] [EKG]: Secondary | ICD-10-CM | POA: Diagnosis not present

## 2021-12-22 DIAGNOSIS — R569 Unspecified convulsions: Secondary | ICD-10-CM | POA: Diagnosis not present

## 2021-12-22 DIAGNOSIS — E119 Type 2 diabetes mellitus without complications: Secondary | ICD-10-CM | POA: Diagnosis not present

## 2021-12-22 DIAGNOSIS — R29810 Facial weakness: Secondary | ICD-10-CM | POA: Diagnosis not present

## 2021-12-22 DIAGNOSIS — R531 Weakness: Secondary | ICD-10-CM | POA: Diagnosis not present

## 2021-12-22 DIAGNOSIS — I10 Essential (primary) hypertension: Secondary | ICD-10-CM | POA: Diagnosis not present

## 2021-12-22 DIAGNOSIS — R0602 Shortness of breath: Secondary | ICD-10-CM | POA: Diagnosis not present

## 2021-12-22 DIAGNOSIS — T17320A Food in larynx causing asphyxiation, initial encounter: Secondary | ICD-10-CM | POA: Diagnosis not present

## 2021-12-22 DIAGNOSIS — I161 Hypertensive emergency: Secondary | ICD-10-CM | POA: Diagnosis not present

## 2021-12-22 DIAGNOSIS — Z91199 Patient's noncompliance with other medical treatment and regimen due to unspecified reason: Secondary | ICD-10-CM | POA: Diagnosis not present

## 2021-12-22 DIAGNOSIS — F17210 Nicotine dependence, cigarettes, uncomplicated: Secondary | ICD-10-CM | POA: Diagnosis not present

## 2021-12-22 DIAGNOSIS — R042 Hemoptysis: Secondary | ICD-10-CM | POA: Diagnosis not present

## 2021-12-22 DIAGNOSIS — E785 Hyperlipidemia, unspecified: Secondary | ICD-10-CM | POA: Diagnosis not present

## 2021-12-22 DIAGNOSIS — J984 Other disorders of lung: Secondary | ICD-10-CM | POA: Diagnosis not present

## 2021-12-22 DIAGNOSIS — I639 Cerebral infarction, unspecified: Secondary | ICD-10-CM | POA: Diagnosis not present

## 2021-12-22 DIAGNOSIS — T45526A Underdosing of antithrombotic drugs, initial encounter: Secondary | ICD-10-CM | POA: Diagnosis not present

## 2021-12-22 DIAGNOSIS — R29705 NIHSS score 5: Secondary | ICD-10-CM | POA: Diagnosis not present

## 2021-12-22 DIAGNOSIS — I2510 Atherosclerotic heart disease of native coronary artery without angina pectoris: Secondary | ICD-10-CM | POA: Diagnosis not present

## 2021-12-22 DIAGNOSIS — Z8673 Personal history of transient ischemic attack (TIA), and cerebral infarction without residual deficits: Secondary | ICD-10-CM | POA: Diagnosis not present

## 2021-12-22 DIAGNOSIS — R29707 NIHSS score 7: Secondary | ICD-10-CM | POA: Diagnosis not present

## 2021-12-22 DIAGNOSIS — R911 Solitary pulmonary nodule: Secondary | ICD-10-CM | POA: Diagnosis not present

## 2021-12-22 DIAGNOSIS — Z79899 Other long term (current) drug therapy: Secondary | ICD-10-CM | POA: Diagnosis not present

## 2021-12-22 DIAGNOSIS — Z7984 Long term (current) use of oral hypoglycemic drugs: Secondary | ICD-10-CM | POA: Diagnosis not present

## 2021-12-22 DIAGNOSIS — I6349 Cerebral infarction due to embolism of other cerebral artery: Secondary | ICD-10-CM | POA: Diagnosis not present

## 2021-12-22 DIAGNOSIS — G4733 Obstructive sleep apnea (adult) (pediatric): Secondary | ICD-10-CM | POA: Diagnosis not present

## 2021-12-22 DIAGNOSIS — I63441 Cerebral infarction due to embolism of right cerebellar artery: Secondary | ICD-10-CM | POA: Diagnosis not present

## 2021-12-22 DIAGNOSIS — Z91128 Patient's intentional underdosing of medication regimen for other reason: Secondary | ICD-10-CM | POA: Diagnosis not present

## 2021-12-22 DIAGNOSIS — I6523 Occlusion and stenosis of bilateral carotid arteries: Secondary | ICD-10-CM | POA: Diagnosis not present

## 2021-12-22 DIAGNOSIS — R4789 Other speech disturbances: Secondary | ICD-10-CM | POA: Diagnosis not present

## 2021-12-22 DIAGNOSIS — R1310 Dysphagia, unspecified: Secondary | ICD-10-CM | POA: Diagnosis not present

## 2021-12-22 DIAGNOSIS — I69354 Hemiplegia and hemiparesis following cerebral infarction affecting left non-dominant side: Secondary | ICD-10-CM | POA: Diagnosis not present

## 2021-12-22 DIAGNOSIS — I3489 Other nonrheumatic mitral valve disorders: Secondary | ICD-10-CM | POA: Diagnosis not present

## 2021-12-22 DIAGNOSIS — E1165 Type 2 diabetes mellitus with hyperglycemia: Secondary | ICD-10-CM | POA: Diagnosis not present

## 2021-12-22 DIAGNOSIS — R479 Unspecified speech disturbances: Secondary | ICD-10-CM | POA: Diagnosis not present

## 2021-12-23 DIAGNOSIS — I3489 Other nonrheumatic mitral valve disorders: Secondary | ICD-10-CM | POA: Diagnosis not present

## 2021-12-23 DIAGNOSIS — Z91199 Patient's noncompliance with other medical treatment and regimen due to unspecified reason: Secondary | ICD-10-CM | POA: Diagnosis not present

## 2021-12-23 DIAGNOSIS — R042 Hemoptysis: Secondary | ICD-10-CM | POA: Diagnosis not present

## 2021-12-23 DIAGNOSIS — R911 Solitary pulmonary nodule: Secondary | ICD-10-CM | POA: Diagnosis not present

## 2021-12-23 DIAGNOSIS — I161 Hypertensive emergency: Secondary | ICD-10-CM | POA: Diagnosis not present

## 2021-12-23 DIAGNOSIS — E785 Hyperlipidemia, unspecified: Secondary | ICD-10-CM | POA: Diagnosis not present

## 2021-12-23 DIAGNOSIS — G4733 Obstructive sleep apnea (adult) (pediatric): Secondary | ICD-10-CM | POA: Diagnosis not present

## 2021-12-23 DIAGNOSIS — E1165 Type 2 diabetes mellitus with hyperglycemia: Secondary | ICD-10-CM | POA: Diagnosis not present

## 2021-12-23 DIAGNOSIS — J984 Other disorders of lung: Secondary | ICD-10-CM | POA: Diagnosis not present

## 2021-12-23 DIAGNOSIS — I639 Cerebral infarction, unspecified: Secondary | ICD-10-CM | POA: Diagnosis not present

## 2021-12-23 DIAGNOSIS — E119 Type 2 diabetes mellitus without complications: Secondary | ICD-10-CM | POA: Diagnosis not present

## 2021-12-23 DIAGNOSIS — Z7984 Long term (current) use of oral hypoglycemic drugs: Secondary | ICD-10-CM | POA: Diagnosis not present

## 2021-12-23 DIAGNOSIS — Z8673 Personal history of transient ischemic attack (TIA), and cerebral infarction without residual deficits: Secondary | ICD-10-CM | POA: Diagnosis not present

## 2021-12-23 DIAGNOSIS — R479 Unspecified speech disturbances: Secondary | ICD-10-CM | POA: Diagnosis not present

## 2021-12-23 DIAGNOSIS — I10 Essential (primary) hypertension: Secondary | ICD-10-CM | POA: Diagnosis not present

## 2021-12-23 DIAGNOSIS — R531 Weakness: Secondary | ICD-10-CM | POA: Diagnosis not present

## 2021-12-23 DIAGNOSIS — F17210 Nicotine dependence, cigarettes, uncomplicated: Secondary | ICD-10-CM | POA: Diagnosis not present

## 2021-12-23 DIAGNOSIS — R569 Unspecified convulsions: Secondary | ICD-10-CM | POA: Diagnosis not present

## 2021-12-24 DIAGNOSIS — Z8673 Personal history of transient ischemic attack (TIA), and cerebral infarction without residual deficits: Secondary | ICD-10-CM | POA: Diagnosis not present

## 2021-12-24 DIAGNOSIS — T17320A Food in larynx causing asphyxiation, initial encounter: Secondary | ICD-10-CM | POA: Diagnosis not present

## 2021-12-24 DIAGNOSIS — G4733 Obstructive sleep apnea (adult) (pediatric): Secondary | ICD-10-CM | POA: Diagnosis not present

## 2021-12-24 DIAGNOSIS — I639 Cerebral infarction, unspecified: Secondary | ICD-10-CM | POA: Diagnosis not present

## 2021-12-24 DIAGNOSIS — I161 Hypertensive emergency: Secondary | ICD-10-CM | POA: Diagnosis not present

## 2021-12-24 DIAGNOSIS — Z7984 Long term (current) use of oral hypoglycemic drugs: Secondary | ICD-10-CM | POA: Diagnosis not present

## 2021-12-24 DIAGNOSIS — E119 Type 2 diabetes mellitus without complications: Secondary | ICD-10-CM | POA: Diagnosis not present

## 2021-12-24 DIAGNOSIS — R1310 Dysphagia, unspecified: Secondary | ICD-10-CM | POA: Diagnosis not present

## 2021-12-24 DIAGNOSIS — F17210 Nicotine dependence, cigarettes, uncomplicated: Secondary | ICD-10-CM | POA: Diagnosis not present

## 2021-12-24 DIAGNOSIS — E1165 Type 2 diabetes mellitus with hyperglycemia: Secondary | ICD-10-CM | POA: Diagnosis not present

## 2021-12-24 DIAGNOSIS — E785 Hyperlipidemia, unspecified: Secondary | ICD-10-CM | POA: Diagnosis not present

## 2021-12-24 DIAGNOSIS — Z91199 Patient's noncompliance with other medical treatment and regimen due to unspecified reason: Secondary | ICD-10-CM | POA: Diagnosis not present

## 2021-12-24 DIAGNOSIS — R9431 Abnormal electrocardiogram [ECG] [EKG]: Secondary | ICD-10-CM | POA: Diagnosis not present

## 2021-12-24 DIAGNOSIS — I10 Essential (primary) hypertension: Secondary | ICD-10-CM | POA: Diagnosis not present

## 2021-12-25 DIAGNOSIS — Z7984 Long term (current) use of oral hypoglycemic drugs: Secondary | ICD-10-CM | POA: Diagnosis not present

## 2021-12-25 DIAGNOSIS — E785 Hyperlipidemia, unspecified: Secondary | ICD-10-CM | POA: Diagnosis not present

## 2021-12-25 DIAGNOSIS — I639 Cerebral infarction, unspecified: Secondary | ICD-10-CM | POA: Diagnosis not present

## 2021-12-25 DIAGNOSIS — E1165 Type 2 diabetes mellitus with hyperglycemia: Secondary | ICD-10-CM | POA: Diagnosis not present

## 2021-12-25 DIAGNOSIS — I161 Hypertensive emergency: Secondary | ICD-10-CM | POA: Diagnosis not present

## 2021-12-25 DIAGNOSIS — E119 Type 2 diabetes mellitus without complications: Secondary | ICD-10-CM | POA: Diagnosis not present

## 2021-12-25 DIAGNOSIS — I361 Nonrheumatic tricuspid (valve) insufficiency: Secondary | ICD-10-CM | POA: Diagnosis not present

## 2021-12-25 DIAGNOSIS — Z91199 Patient's noncompliance with other medical treatment and regimen due to unspecified reason: Secondary | ICD-10-CM | POA: Diagnosis not present

## 2021-12-25 DIAGNOSIS — Z8673 Personal history of transient ischemic attack (TIA), and cerebral infarction without residual deficits: Secondary | ICD-10-CM | POA: Diagnosis not present

## 2021-12-25 DIAGNOSIS — G4733 Obstructive sleep apnea (adult) (pediatric): Secondary | ICD-10-CM | POA: Diagnosis not present

## 2021-12-25 DIAGNOSIS — I10 Essential (primary) hypertension: Secondary | ICD-10-CM | POA: Diagnosis not present

## 2021-12-25 DIAGNOSIS — F17210 Nicotine dependence, cigarettes, uncomplicated: Secondary | ICD-10-CM | POA: Diagnosis not present

## 2021-12-26 DIAGNOSIS — E785 Hyperlipidemia, unspecified: Secondary | ICD-10-CM | POA: Diagnosis not present

## 2021-12-26 DIAGNOSIS — E1159 Type 2 diabetes mellitus with other circulatory complications: Secondary | ICD-10-CM | POA: Diagnosis not present

## 2021-12-26 DIAGNOSIS — Z7982 Long term (current) use of aspirin: Secondary | ICD-10-CM | POA: Diagnosis not present

## 2021-12-26 DIAGNOSIS — I6350 Cerebral infarction due to unspecified occlusion or stenosis of unspecified cerebral artery: Secondary | ICD-10-CM | POA: Diagnosis not present

## 2021-12-26 DIAGNOSIS — R4701 Aphasia: Secondary | ICD-10-CM | POA: Diagnosis not present

## 2021-12-26 DIAGNOSIS — Z7984 Long term (current) use of oral hypoglycemic drugs: Secondary | ICD-10-CM | POA: Diagnosis not present

## 2021-12-26 DIAGNOSIS — E782 Mixed hyperlipidemia: Secondary | ICD-10-CM | POA: Diagnosis not present

## 2021-12-26 DIAGNOSIS — I161 Hypertensive emergency: Secondary | ICD-10-CM | POA: Diagnosis not present

## 2021-12-26 DIAGNOSIS — I69320 Aphasia following cerebral infarction: Secondary | ICD-10-CM | POA: Diagnosis not present

## 2021-12-26 DIAGNOSIS — K117 Disturbances of salivary secretion: Secondary | ICD-10-CM | POA: Diagnosis not present

## 2021-12-26 DIAGNOSIS — E119 Type 2 diabetes mellitus without complications: Secondary | ICD-10-CM | POA: Diagnosis not present

## 2021-12-26 DIAGNOSIS — Z7409 Other reduced mobility: Secondary | ICD-10-CM | POA: Diagnosis not present

## 2021-12-26 DIAGNOSIS — Z7902 Long term (current) use of antithrombotics/antiplatelets: Secondary | ICD-10-CM | POA: Diagnosis not present

## 2021-12-26 DIAGNOSIS — K2100 Gastro-esophageal reflux disease with esophagitis, without bleeding: Secondary | ICD-10-CM | POA: Diagnosis not present

## 2021-12-26 DIAGNOSIS — K2970 Gastritis, unspecified, without bleeding: Secondary | ICD-10-CM | POA: Diagnosis not present

## 2021-12-26 DIAGNOSIS — I10 Essential (primary) hypertension: Secondary | ICD-10-CM | POA: Diagnosis not present

## 2021-12-26 DIAGNOSIS — K5900 Constipation, unspecified: Secondary | ICD-10-CM | POA: Diagnosis not present

## 2021-12-26 DIAGNOSIS — R1310 Dysphagia, unspecified: Secondary | ICD-10-CM | POA: Diagnosis not present

## 2021-12-26 DIAGNOSIS — I679 Cerebrovascular disease, unspecified: Secondary | ICD-10-CM | POA: Diagnosis not present

## 2021-12-26 DIAGNOSIS — I639 Cerebral infarction, unspecified: Secondary | ICD-10-CM | POA: Diagnosis not present

## 2021-12-26 DIAGNOSIS — I951 Orthostatic hypotension: Secondary | ICD-10-CM | POA: Diagnosis not present

## 2021-12-26 DIAGNOSIS — I69020 Aphasia following nontraumatic subarachnoid hemorrhage: Secondary | ICD-10-CM | POA: Diagnosis not present

## 2021-12-26 DIAGNOSIS — R531 Weakness: Secondary | ICD-10-CM | POA: Diagnosis not present

## 2021-12-26 DIAGNOSIS — G4733 Obstructive sleep apnea (adult) (pediatric): Secondary | ICD-10-CM | POA: Diagnosis not present

## 2021-12-26 DIAGNOSIS — E1165 Type 2 diabetes mellitus with hyperglycemia: Secondary | ICD-10-CM | POA: Diagnosis not present

## 2021-12-26 DIAGNOSIS — I69354 Hemiplegia and hemiparesis following cerebral infarction affecting left non-dominant side: Secondary | ICD-10-CM | POA: Diagnosis not present

## 2021-12-26 DIAGNOSIS — R627 Adult failure to thrive: Secondary | ICD-10-CM | POA: Diagnosis not present

## 2021-12-26 DIAGNOSIS — I6349 Cerebral infarction due to embolism of other cerebral artery: Secondary | ICD-10-CM | POA: Diagnosis not present

## 2021-12-26 DIAGNOSIS — E876 Hypokalemia: Secondary | ICD-10-CM | POA: Diagnosis not present

## 2021-12-26 DIAGNOSIS — Z8673 Personal history of transient ischemic attack (TIA), and cerebral infarction without residual deficits: Secondary | ICD-10-CM | POA: Diagnosis not present

## 2021-12-26 DIAGNOSIS — R6339 Other feeding difficulties: Secondary | ICD-10-CM | POA: Diagnosis not present

## 2021-12-26 DIAGNOSIS — Z91199 Patient's noncompliance with other medical treatment and regimen due to unspecified reason: Secondary | ICD-10-CM | POA: Diagnosis not present

## 2021-12-26 DIAGNOSIS — R911 Solitary pulmonary nodule: Secondary | ICD-10-CM | POA: Diagnosis not present

## 2021-12-26 DIAGNOSIS — Z7985 Long-term (current) use of injectable non-insulin antidiabetic drugs: Secondary | ICD-10-CM | POA: Diagnosis not present

## 2021-12-26 DIAGNOSIS — Z79899 Other long term (current) drug therapy: Secondary | ICD-10-CM | POA: Diagnosis not present

## 2021-12-26 DIAGNOSIS — F17210 Nicotine dependence, cigarettes, uncomplicated: Secondary | ICD-10-CM | POA: Diagnosis not present

## 2021-12-26 DIAGNOSIS — I69391 Dysphagia following cerebral infarction: Secondary | ICD-10-CM | POA: Diagnosis not present

## 2021-12-27 DIAGNOSIS — I639 Cerebral infarction, unspecified: Secondary | ICD-10-CM | POA: Diagnosis not present

## 2021-12-27 DIAGNOSIS — I10 Essential (primary) hypertension: Secondary | ICD-10-CM | POA: Diagnosis not present

## 2021-12-27 DIAGNOSIS — E782 Mixed hyperlipidemia: Secondary | ICD-10-CM | POA: Diagnosis not present

## 2021-12-27 DIAGNOSIS — E1159 Type 2 diabetes mellitus with other circulatory complications: Secondary | ICD-10-CM | POA: Diagnosis not present

## 2021-12-27 DIAGNOSIS — E1165 Type 2 diabetes mellitus with hyperglycemia: Secondary | ICD-10-CM | POA: Diagnosis not present

## 2021-12-28 DIAGNOSIS — I639 Cerebral infarction, unspecified: Secondary | ICD-10-CM | POA: Diagnosis not present

## 2021-12-28 DIAGNOSIS — E782 Mixed hyperlipidemia: Secondary | ICD-10-CM | POA: Diagnosis not present

## 2021-12-28 DIAGNOSIS — E1159 Type 2 diabetes mellitus with other circulatory complications: Secondary | ICD-10-CM | POA: Diagnosis not present

## 2021-12-28 DIAGNOSIS — I10 Essential (primary) hypertension: Secondary | ICD-10-CM | POA: Diagnosis not present

## 2021-12-28 DIAGNOSIS — E1165 Type 2 diabetes mellitus with hyperglycemia: Secondary | ICD-10-CM | POA: Diagnosis not present

## 2021-12-29 DIAGNOSIS — E1159 Type 2 diabetes mellitus with other circulatory complications: Secondary | ICD-10-CM | POA: Diagnosis not present

## 2021-12-29 DIAGNOSIS — E782 Mixed hyperlipidemia: Secondary | ICD-10-CM | POA: Diagnosis not present

## 2021-12-29 DIAGNOSIS — E1165 Type 2 diabetes mellitus with hyperglycemia: Secondary | ICD-10-CM | POA: Diagnosis not present

## 2021-12-29 DIAGNOSIS — I10 Essential (primary) hypertension: Secondary | ICD-10-CM | POA: Diagnosis not present

## 2021-12-30 DIAGNOSIS — E1159 Type 2 diabetes mellitus with other circulatory complications: Secondary | ICD-10-CM | POA: Diagnosis not present

## 2021-12-30 DIAGNOSIS — I10 Essential (primary) hypertension: Secondary | ICD-10-CM | POA: Diagnosis not present

## 2021-12-30 DIAGNOSIS — E1165 Type 2 diabetes mellitus with hyperglycemia: Secondary | ICD-10-CM | POA: Diagnosis not present

## 2021-12-30 DIAGNOSIS — E782 Mixed hyperlipidemia: Secondary | ICD-10-CM | POA: Diagnosis not present

## 2021-12-31 DIAGNOSIS — E782 Mixed hyperlipidemia: Secondary | ICD-10-CM | POA: Diagnosis not present

## 2021-12-31 DIAGNOSIS — E1159 Type 2 diabetes mellitus with other circulatory complications: Secondary | ICD-10-CM | POA: Diagnosis not present

## 2021-12-31 DIAGNOSIS — E1165 Type 2 diabetes mellitus with hyperglycemia: Secondary | ICD-10-CM | POA: Diagnosis not present

## 2021-12-31 DIAGNOSIS — I10 Essential (primary) hypertension: Secondary | ICD-10-CM | POA: Diagnosis not present

## 2021-12-31 DIAGNOSIS — I679 Cerebrovascular disease, unspecified: Secondary | ICD-10-CM | POA: Diagnosis not present

## 2022-01-01 DIAGNOSIS — I639 Cerebral infarction, unspecified: Secondary | ICD-10-CM | POA: Diagnosis not present

## 2022-01-01 DIAGNOSIS — E1165 Type 2 diabetes mellitus with hyperglycemia: Secondary | ICD-10-CM | POA: Diagnosis not present

## 2022-01-01 DIAGNOSIS — I10 Essential (primary) hypertension: Secondary | ICD-10-CM | POA: Diagnosis not present

## 2022-01-01 DIAGNOSIS — E1159 Type 2 diabetes mellitus with other circulatory complications: Secondary | ICD-10-CM | POA: Diagnosis not present

## 2022-01-01 DIAGNOSIS — E782 Mixed hyperlipidemia: Secondary | ICD-10-CM | POA: Diagnosis not present

## 2022-01-02 DIAGNOSIS — E1165 Type 2 diabetes mellitus with hyperglycemia: Secondary | ICD-10-CM | POA: Diagnosis not present

## 2022-01-02 DIAGNOSIS — I10 Essential (primary) hypertension: Secondary | ICD-10-CM | POA: Diagnosis not present

## 2022-01-02 DIAGNOSIS — E782 Mixed hyperlipidemia: Secondary | ICD-10-CM | POA: Diagnosis not present

## 2022-01-02 DIAGNOSIS — I639 Cerebral infarction, unspecified: Secondary | ICD-10-CM | POA: Diagnosis not present

## 2022-01-02 DIAGNOSIS — E1159 Type 2 diabetes mellitus with other circulatory complications: Secondary | ICD-10-CM | POA: Diagnosis not present

## 2022-01-03 DIAGNOSIS — E782 Mixed hyperlipidemia: Secondary | ICD-10-CM | POA: Diagnosis not present

## 2022-01-03 DIAGNOSIS — I639 Cerebral infarction, unspecified: Secondary | ICD-10-CM | POA: Diagnosis not present

## 2022-01-03 DIAGNOSIS — E1159 Type 2 diabetes mellitus with other circulatory complications: Secondary | ICD-10-CM | POA: Diagnosis not present

## 2022-01-03 DIAGNOSIS — E1165 Type 2 diabetes mellitus with hyperglycemia: Secondary | ICD-10-CM | POA: Diagnosis not present

## 2022-01-03 DIAGNOSIS — I10 Essential (primary) hypertension: Secondary | ICD-10-CM | POA: Diagnosis not present

## 2022-01-04 DIAGNOSIS — I639 Cerebral infarction, unspecified: Secondary | ICD-10-CM | POA: Diagnosis not present

## 2022-01-04 DIAGNOSIS — E782 Mixed hyperlipidemia: Secondary | ICD-10-CM | POA: Diagnosis not present

## 2022-01-04 DIAGNOSIS — I10 Essential (primary) hypertension: Secondary | ICD-10-CM | POA: Diagnosis not present

## 2022-01-04 DIAGNOSIS — E1165 Type 2 diabetes mellitus with hyperglycemia: Secondary | ICD-10-CM | POA: Diagnosis not present

## 2022-01-04 DIAGNOSIS — E1159 Type 2 diabetes mellitus with other circulatory complications: Secondary | ICD-10-CM | POA: Diagnosis not present

## 2022-01-05 DIAGNOSIS — E1165 Type 2 diabetes mellitus with hyperglycemia: Secondary | ICD-10-CM | POA: Diagnosis not present

## 2022-01-05 DIAGNOSIS — E1159 Type 2 diabetes mellitus with other circulatory complications: Secondary | ICD-10-CM | POA: Diagnosis not present

## 2022-01-05 DIAGNOSIS — E782 Mixed hyperlipidemia: Secondary | ICD-10-CM | POA: Diagnosis not present

## 2022-01-05 DIAGNOSIS — I10 Essential (primary) hypertension: Secondary | ICD-10-CM | POA: Diagnosis not present

## 2022-01-06 DIAGNOSIS — E782 Mixed hyperlipidemia: Secondary | ICD-10-CM | POA: Diagnosis not present

## 2022-01-06 DIAGNOSIS — E1165 Type 2 diabetes mellitus with hyperglycemia: Secondary | ICD-10-CM | POA: Diagnosis not present

## 2022-01-06 DIAGNOSIS — E1159 Type 2 diabetes mellitus with other circulatory complications: Secondary | ICD-10-CM | POA: Diagnosis not present

## 2022-01-06 DIAGNOSIS — I10 Essential (primary) hypertension: Secondary | ICD-10-CM | POA: Diagnosis not present

## 2022-01-07 DIAGNOSIS — E782 Mixed hyperlipidemia: Secondary | ICD-10-CM | POA: Diagnosis not present

## 2022-01-07 DIAGNOSIS — E1165 Type 2 diabetes mellitus with hyperglycemia: Secondary | ICD-10-CM | POA: Diagnosis not present

## 2022-01-07 DIAGNOSIS — I10 Essential (primary) hypertension: Secondary | ICD-10-CM | POA: Diagnosis not present

## 2022-01-07 DIAGNOSIS — E1159 Type 2 diabetes mellitus with other circulatory complications: Secondary | ICD-10-CM | POA: Diagnosis not present

## 2022-01-08 DIAGNOSIS — E1159 Type 2 diabetes mellitus with other circulatory complications: Secondary | ICD-10-CM | POA: Diagnosis not present

## 2022-01-08 DIAGNOSIS — E782 Mixed hyperlipidemia: Secondary | ICD-10-CM | POA: Diagnosis not present

## 2022-01-08 DIAGNOSIS — I10 Essential (primary) hypertension: Secondary | ICD-10-CM | POA: Diagnosis not present

## 2022-01-08 DIAGNOSIS — E1165 Type 2 diabetes mellitus with hyperglycemia: Secondary | ICD-10-CM | POA: Diagnosis not present

## 2022-01-16 DIAGNOSIS — R1319 Other dysphagia: Secondary | ICD-10-CM | POA: Diagnosis not present

## 2022-01-16 DIAGNOSIS — E1169 Type 2 diabetes mellitus with other specified complication: Secondary | ICD-10-CM | POA: Diagnosis not present

## 2022-01-16 DIAGNOSIS — I639 Cerebral infarction, unspecified: Secondary | ICD-10-CM | POA: Diagnosis not present

## 2022-01-20 DIAGNOSIS — I639 Cerebral infarction, unspecified: Secondary | ICD-10-CM | POA: Diagnosis not present

## 2022-01-20 DIAGNOSIS — R1319 Other dysphagia: Secondary | ICD-10-CM | POA: Diagnosis not present

## 2022-01-20 DIAGNOSIS — E1169 Type 2 diabetes mellitus with other specified complication: Secondary | ICD-10-CM | POA: Diagnosis not present

## 2022-01-22 DIAGNOSIS — R9089 Other abnormal findings on diagnostic imaging of central nervous system: Secondary | ICD-10-CM | POA: Diagnosis not present

## 2022-01-22 DIAGNOSIS — E1165 Type 2 diabetes mellitus with hyperglycemia: Secondary | ICD-10-CM | POA: Diagnosis not present

## 2022-01-22 DIAGNOSIS — I69391 Dysphagia following cerebral infarction: Secondary | ICD-10-CM | POA: Diagnosis not present

## 2022-01-22 DIAGNOSIS — E785 Hyperlipidemia, unspecified: Secondary | ICD-10-CM | POA: Diagnosis not present

## 2022-01-22 DIAGNOSIS — E162 Hypoglycemia, unspecified: Secondary | ICD-10-CM | POA: Diagnosis not present

## 2022-01-22 DIAGNOSIS — Z79899 Other long term (current) drug therapy: Secondary | ICD-10-CM | POA: Diagnosis not present

## 2022-01-22 DIAGNOSIS — R54 Age-related physical debility: Secondary | ICD-10-CM | POA: Diagnosis not present

## 2022-01-22 DIAGNOSIS — I6523 Occlusion and stenosis of bilateral carotid arteries: Secondary | ICD-10-CM | POA: Diagnosis not present

## 2022-01-22 DIAGNOSIS — I69352 Hemiplegia and hemiparesis following cerebral infarction affecting left dominant side: Secondary | ICD-10-CM | POA: Diagnosis not present

## 2022-01-22 DIAGNOSIS — Z7982 Long term (current) use of aspirin: Secondary | ICD-10-CM | POA: Diagnosis not present

## 2022-01-22 DIAGNOSIS — Z7985 Long-term (current) use of injectable non-insulin antidiabetic drugs: Secondary | ICD-10-CM | POA: Diagnosis not present

## 2022-01-22 DIAGNOSIS — R1319 Other dysphagia: Secondary | ICD-10-CM | POA: Diagnosis not present

## 2022-01-22 DIAGNOSIS — R1310 Dysphagia, unspecified: Secondary | ICD-10-CM | POA: Diagnosis not present

## 2022-01-22 DIAGNOSIS — R569 Unspecified convulsions: Secondary | ICD-10-CM | POA: Diagnosis not present

## 2022-01-22 DIAGNOSIS — I10 Essential (primary) hypertension: Secondary | ICD-10-CM | POA: Diagnosis not present

## 2022-01-22 DIAGNOSIS — I639 Cerebral infarction, unspecified: Secondary | ICD-10-CM | POA: Diagnosis not present

## 2022-01-22 DIAGNOSIS — G9341 Metabolic encephalopathy: Secondary | ICD-10-CM | POA: Diagnosis not present

## 2022-01-22 DIAGNOSIS — I6381 Other cerebral infarction due to occlusion or stenosis of small artery: Secondary | ICD-10-CM | POA: Diagnosis not present

## 2022-01-22 DIAGNOSIS — R29818 Other symptoms and signs involving the nervous system: Secondary | ICD-10-CM | POA: Diagnosis not present

## 2022-01-22 DIAGNOSIS — Z7902 Long term (current) use of antithrombotics/antiplatelets: Secondary | ICD-10-CM | POA: Diagnosis not present

## 2022-01-22 DIAGNOSIS — I69320 Aphasia following cerebral infarction: Secondary | ICD-10-CM | POA: Diagnosis not present

## 2022-01-22 DIAGNOSIS — I69354 Hemiplegia and hemiparesis following cerebral infarction affecting left non-dominant side: Secondary | ICD-10-CM | POA: Diagnosis not present

## 2022-01-22 DIAGNOSIS — R4701 Aphasia: Secondary | ICD-10-CM | POA: Diagnosis not present

## 2022-01-22 DIAGNOSIS — G9389 Other specified disorders of brain: Secondary | ICD-10-CM | POA: Diagnosis not present

## 2022-01-22 DIAGNOSIS — K9429 Other complications of gastrostomy: Secondary | ICD-10-CM | POA: Diagnosis not present

## 2022-01-22 DIAGNOSIS — Z931 Gastrostomy status: Secondary | ICD-10-CM | POA: Diagnosis not present

## 2022-01-22 DIAGNOSIS — I6782 Cerebral ischemia: Secondary | ICD-10-CM | POA: Diagnosis not present

## 2022-01-22 DIAGNOSIS — E1169 Type 2 diabetes mellitus with other specified complication: Secondary | ICD-10-CM | POA: Diagnosis not present

## 2022-01-22 DIAGNOSIS — M5030 Other cervical disc degeneration, unspecified cervical region: Secondary | ICD-10-CM | POA: Diagnosis not present

## 2022-01-22 DIAGNOSIS — I361 Nonrheumatic tricuspid (valve) insufficiency: Secondary | ICD-10-CM | POA: Diagnosis not present

## 2022-01-22 DIAGNOSIS — K9423 Gastrostomy malfunction: Secondary | ICD-10-CM | POA: Diagnosis not present

## 2022-01-22 DIAGNOSIS — R2990 Unspecified symptoms and signs involving the nervous system: Secondary | ICD-10-CM | POA: Diagnosis not present

## 2022-01-22 DIAGNOSIS — G936 Cerebral edema: Secondary | ICD-10-CM | POA: Diagnosis not present

## 2022-01-22 DIAGNOSIS — E119 Type 2 diabetes mellitus without complications: Secondary | ICD-10-CM | POA: Diagnosis not present

## 2022-01-22 DIAGNOSIS — Z7984 Long term (current) use of oral hypoglycemic drugs: Secondary | ICD-10-CM | POA: Diagnosis not present

## 2022-01-22 DIAGNOSIS — Z743 Need for continuous supervision: Secondary | ICD-10-CM | POA: Diagnosis not present

## 2022-01-23 DIAGNOSIS — I639 Cerebral infarction, unspecified: Secondary | ICD-10-CM | POA: Diagnosis not present

## 2022-01-23 DIAGNOSIS — R9089 Other abnormal findings on diagnostic imaging of central nervous system: Secondary | ICD-10-CM | POA: Diagnosis not present

## 2022-01-23 DIAGNOSIS — E119 Type 2 diabetes mellitus without complications: Secondary | ICD-10-CM | POA: Diagnosis not present

## 2022-01-23 DIAGNOSIS — I10 Essential (primary) hypertension: Secondary | ICD-10-CM | POA: Diagnosis not present

## 2022-01-23 DIAGNOSIS — G9341 Metabolic encephalopathy: Secondary | ICD-10-CM | POA: Diagnosis not present

## 2022-01-23 DIAGNOSIS — G9389 Other specified disorders of brain: Secondary | ICD-10-CM | POA: Diagnosis not present

## 2022-01-23 DIAGNOSIS — I69320 Aphasia following cerebral infarction: Secondary | ICD-10-CM | POA: Diagnosis not present

## 2022-01-23 DIAGNOSIS — I6782 Cerebral ischemia: Secondary | ICD-10-CM | POA: Diagnosis not present

## 2022-01-23 DIAGNOSIS — R569 Unspecified convulsions: Secondary | ICD-10-CM | POA: Diagnosis not present

## 2022-01-23 DIAGNOSIS — E162 Hypoglycemia, unspecified: Secondary | ICD-10-CM | POA: Diagnosis not present

## 2022-01-23 DIAGNOSIS — E785 Hyperlipidemia, unspecified: Secondary | ICD-10-CM | POA: Diagnosis not present

## 2022-01-23 DIAGNOSIS — G936 Cerebral edema: Secondary | ICD-10-CM | POA: Diagnosis not present

## 2022-01-24 DIAGNOSIS — E1165 Type 2 diabetes mellitus with hyperglycemia: Secondary | ICD-10-CM | POA: Diagnosis not present

## 2022-01-24 DIAGNOSIS — I361 Nonrheumatic tricuspid (valve) insufficiency: Secondary | ICD-10-CM | POA: Diagnosis not present

## 2022-01-24 DIAGNOSIS — Z7984 Long term (current) use of oral hypoglycemic drugs: Secondary | ICD-10-CM | POA: Diagnosis not present

## 2022-01-24 DIAGNOSIS — R2990 Unspecified symptoms and signs involving the nervous system: Secondary | ICD-10-CM | POA: Diagnosis not present

## 2022-01-24 DIAGNOSIS — R569 Unspecified convulsions: Secondary | ICD-10-CM | POA: Diagnosis not present

## 2022-01-25 DIAGNOSIS — Z7984 Long term (current) use of oral hypoglycemic drugs: Secondary | ICD-10-CM | POA: Diagnosis not present

## 2022-01-25 DIAGNOSIS — E119 Type 2 diabetes mellitus without complications: Secondary | ICD-10-CM | POA: Diagnosis not present

## 2022-01-25 DIAGNOSIS — E1165 Type 2 diabetes mellitus with hyperglycemia: Secondary | ICD-10-CM | POA: Diagnosis not present

## 2022-01-25 DIAGNOSIS — R2990 Unspecified symptoms and signs involving the nervous system: Secondary | ICD-10-CM | POA: Diagnosis not present

## 2022-01-25 DIAGNOSIS — I6381 Other cerebral infarction due to occlusion or stenosis of small artery: Secondary | ICD-10-CM | POA: Diagnosis not present

## 2022-01-26 DIAGNOSIS — R2990 Unspecified symptoms and signs involving the nervous system: Secondary | ICD-10-CM | POA: Diagnosis not present

## 2022-01-26 DIAGNOSIS — E1165 Type 2 diabetes mellitus with hyperglycemia: Secondary | ICD-10-CM | POA: Diagnosis not present

## 2022-01-26 DIAGNOSIS — Z7984 Long term (current) use of oral hypoglycemic drugs: Secondary | ICD-10-CM | POA: Diagnosis not present

## 2022-01-27 ENCOUNTER — Encounter: Payer: Self-pay | Admitting: Family Medicine

## 2022-01-27 DIAGNOSIS — I69391 Dysphagia following cerebral infarction: Secondary | ICD-10-CM | POA: Diagnosis not present

## 2022-01-27 DIAGNOSIS — I10 Essential (primary) hypertension: Secondary | ICD-10-CM | POA: Diagnosis not present

## 2022-01-27 DIAGNOSIS — G9341 Metabolic encephalopathy: Secondary | ICD-10-CM | POA: Diagnosis not present

## 2022-01-27 DIAGNOSIS — Z7984 Long term (current) use of oral hypoglycemic drugs: Secondary | ICD-10-CM | POA: Diagnosis not present

## 2022-01-27 DIAGNOSIS — E119 Type 2 diabetes mellitus without complications: Secondary | ICD-10-CM | POA: Diagnosis not present

## 2022-01-27 DIAGNOSIS — K9429 Other complications of gastrostomy: Secondary | ICD-10-CM | POA: Diagnosis not present

## 2022-01-27 DIAGNOSIS — Z931 Gastrostomy status: Secondary | ICD-10-CM | POA: Diagnosis not present

## 2022-01-27 DIAGNOSIS — E1165 Type 2 diabetes mellitus with hyperglycemia: Secondary | ICD-10-CM | POA: Diagnosis not present

## 2022-01-27 DIAGNOSIS — E785 Hyperlipidemia, unspecified: Secondary | ICD-10-CM | POA: Diagnosis not present

## 2022-01-27 DIAGNOSIS — I639 Cerebral infarction, unspecified: Secondary | ICD-10-CM | POA: Diagnosis not present

## 2022-01-27 DIAGNOSIS — I69352 Hemiplegia and hemiparesis following cerebral infarction affecting left dominant side: Secondary | ICD-10-CM | POA: Diagnosis not present

## 2022-01-27 DIAGNOSIS — I69320 Aphasia following cerebral infarction: Secondary | ICD-10-CM | POA: Diagnosis not present

## 2022-01-28 DIAGNOSIS — I639 Cerebral infarction, unspecified: Secondary | ICD-10-CM | POA: Diagnosis not present

## 2022-01-28 DIAGNOSIS — I69391 Dysphagia following cerebral infarction: Secondary | ICD-10-CM | POA: Diagnosis not present

## 2022-01-28 DIAGNOSIS — G9341 Metabolic encephalopathy: Secondary | ICD-10-CM | POA: Diagnosis not present

## 2022-01-28 DIAGNOSIS — Z931 Gastrostomy status: Secondary | ICD-10-CM | POA: Diagnosis not present

## 2022-01-28 DIAGNOSIS — I69320 Aphasia following cerebral infarction: Secondary | ICD-10-CM | POA: Diagnosis not present

## 2022-01-28 DIAGNOSIS — E119 Type 2 diabetes mellitus without complications: Secondary | ICD-10-CM | POA: Diagnosis not present

## 2022-01-28 DIAGNOSIS — E1165 Type 2 diabetes mellitus with hyperglycemia: Secondary | ICD-10-CM | POA: Diagnosis not present

## 2022-01-28 DIAGNOSIS — I69352 Hemiplegia and hemiparesis following cerebral infarction affecting left dominant side: Secondary | ICD-10-CM | POA: Diagnosis not present

## 2022-01-28 DIAGNOSIS — Z7984 Long term (current) use of oral hypoglycemic drugs: Secondary | ICD-10-CM | POA: Diagnosis not present

## 2022-01-28 DIAGNOSIS — E785 Hyperlipidemia, unspecified: Secondary | ICD-10-CM | POA: Diagnosis not present

## 2022-01-28 DIAGNOSIS — I10 Essential (primary) hypertension: Secondary | ICD-10-CM | POA: Diagnosis not present

## 2022-01-29 DIAGNOSIS — E1165 Type 2 diabetes mellitus with hyperglycemia: Secondary | ICD-10-CM | POA: Diagnosis not present

## 2022-01-29 DIAGNOSIS — Z7984 Long term (current) use of oral hypoglycemic drugs: Secondary | ICD-10-CM | POA: Diagnosis not present

## 2022-01-29 DIAGNOSIS — R2990 Unspecified symptoms and signs involving the nervous system: Secondary | ICD-10-CM | POA: Diagnosis not present

## 2022-01-30 DIAGNOSIS — Z7984 Long term (current) use of oral hypoglycemic drugs: Secondary | ICD-10-CM | POA: Diagnosis not present

## 2022-01-30 DIAGNOSIS — R2990 Unspecified symptoms and signs involving the nervous system: Secondary | ICD-10-CM | POA: Diagnosis not present

## 2022-01-30 DIAGNOSIS — E1165 Type 2 diabetes mellitus with hyperglycemia: Secondary | ICD-10-CM | POA: Diagnosis not present

## 2022-01-31 DIAGNOSIS — R2990 Unspecified symptoms and signs involving the nervous system: Secondary | ICD-10-CM | POA: Diagnosis not present

## 2022-01-31 DIAGNOSIS — E1165 Type 2 diabetes mellitus with hyperglycemia: Secondary | ICD-10-CM | POA: Diagnosis not present

## 2022-01-31 DIAGNOSIS — Z7984 Long term (current) use of oral hypoglycemic drugs: Secondary | ICD-10-CM | POA: Diagnosis not present

## 2022-02-01 DIAGNOSIS — I69322 Dysarthria following cerebral infarction: Secondary | ICD-10-CM | POA: Diagnosis not present

## 2022-02-01 DIAGNOSIS — I639 Cerebral infarction, unspecified: Secondary | ICD-10-CM | POA: Diagnosis not present

## 2022-02-01 DIAGNOSIS — I951 Orthostatic hypotension: Secondary | ICD-10-CM | POA: Diagnosis not present

## 2022-02-01 DIAGNOSIS — Z7401 Bed confinement status: Secondary | ICD-10-CM | POA: Diagnosis not present

## 2022-02-01 DIAGNOSIS — E119 Type 2 diabetes mellitus without complications: Secondary | ICD-10-CM | POA: Diagnosis not present

## 2022-02-01 DIAGNOSIS — I69311 Memory deficit following cerebral infarction: Secondary | ICD-10-CM | POA: Diagnosis not present

## 2022-02-01 DIAGNOSIS — R2990 Unspecified symptoms and signs involving the nervous system: Secondary | ICD-10-CM | POA: Diagnosis not present

## 2022-02-01 DIAGNOSIS — I69320 Aphasia following cerebral infarction: Secondary | ICD-10-CM | POA: Diagnosis not present

## 2022-02-01 DIAGNOSIS — L89611 Pressure ulcer of right heel, stage 1: Secondary | ICD-10-CM | POA: Diagnosis not present

## 2022-02-01 DIAGNOSIS — E1169 Type 2 diabetes mellitus with other specified complication: Secondary | ICD-10-CM | POA: Diagnosis not present

## 2022-02-01 DIAGNOSIS — I6350 Cerebral infarction due to unspecified occlusion or stenosis of unspecified cerebral artery: Secondary | ICD-10-CM | POA: Diagnosis not present

## 2022-02-01 DIAGNOSIS — I69391 Dysphagia following cerebral infarction: Secondary | ICD-10-CM | POA: Diagnosis not present

## 2022-02-01 DIAGNOSIS — I69354 Hemiplegia and hemiparesis following cerebral infarction affecting left non-dominant side: Secondary | ICD-10-CM | POA: Diagnosis not present

## 2022-02-01 DIAGNOSIS — I10 Essential (primary) hypertension: Secondary | ICD-10-CM | POA: Diagnosis not present

## 2022-02-01 DIAGNOSIS — I69991 Dysphagia following unspecified cerebrovascular disease: Secondary | ICD-10-CM | POA: Diagnosis not present

## 2022-02-01 DIAGNOSIS — H53462 Homonymous bilateral field defects, left side: Secondary | ICD-10-CM | POA: Diagnosis not present

## 2022-02-01 DIAGNOSIS — K219 Gastro-esophageal reflux disease without esophagitis: Secondary | ICD-10-CM | POA: Diagnosis not present

## 2022-02-01 DIAGNOSIS — R1310 Dysphagia, unspecified: Secondary | ICD-10-CM | POA: Diagnosis not present

## 2022-02-01 DIAGNOSIS — I69398 Other sequelae of cerebral infarction: Secondary | ICD-10-CM | POA: Diagnosis not present

## 2022-02-01 DIAGNOSIS — I6931 Attention and concentration deficit following cerebral infarction: Secondary | ICD-10-CM | POA: Diagnosis not present

## 2022-02-01 DIAGNOSIS — R4701 Aphasia: Secondary | ICD-10-CM | POA: Diagnosis not present

## 2022-02-01 DIAGNOSIS — R1319 Other dysphagia: Secondary | ICD-10-CM | POA: Diagnosis not present

## 2022-02-01 DIAGNOSIS — R531 Weakness: Secondary | ICD-10-CM | POA: Diagnosis not present

## 2022-02-01 DIAGNOSIS — I69392 Facial weakness following cerebral infarction: Secondary | ICD-10-CM | POA: Diagnosis not present

## 2022-02-01 DIAGNOSIS — R7401 Elevation of levels of liver transaminase levels: Secondary | ICD-10-CM | POA: Diagnosis not present

## 2022-02-01 DIAGNOSIS — E1165 Type 2 diabetes mellitus with hyperglycemia: Secondary | ICD-10-CM | POA: Diagnosis not present

## 2022-02-01 DIAGNOSIS — K5900 Constipation, unspecified: Secondary | ICD-10-CM | POA: Diagnosis not present

## 2022-02-01 DIAGNOSIS — E876 Hypokalemia: Secondary | ICD-10-CM | POA: Diagnosis not present

## 2022-02-01 DIAGNOSIS — I69321 Dysphasia following cerebral infarction: Secondary | ICD-10-CM | POA: Diagnosis not present

## 2022-02-01 DIAGNOSIS — F32A Depression, unspecified: Secondary | ICD-10-CM | POA: Diagnosis not present

## 2022-02-01 DIAGNOSIS — G8194 Hemiplegia, unspecified affecting left nondominant side: Secondary | ICD-10-CM | POA: Diagnosis not present

## 2022-02-01 DIAGNOSIS — I69314 Frontal lobe and executive function deficit following cerebral infarction: Secondary | ICD-10-CM | POA: Diagnosis not present

## 2022-02-01 DIAGNOSIS — L89626 Pressure-induced deep tissue damage of left heel: Secondary | ICD-10-CM | POA: Diagnosis not present

## 2022-02-01 DIAGNOSIS — I69921 Dysphasia following unspecified cerebrovascular disease: Secondary | ICD-10-CM | POA: Diagnosis not present

## 2022-02-01 DIAGNOSIS — E785 Hyperlipidemia, unspecified: Secondary | ICD-10-CM | POA: Diagnosis not present

## 2022-02-01 DIAGNOSIS — Z7984 Long term (current) use of oral hypoglycemic drugs: Secondary | ICD-10-CM | POA: Diagnosis not present

## 2022-02-01 DIAGNOSIS — I69020 Aphasia following nontraumatic subarachnoid hemorrhage: Secondary | ICD-10-CM | POA: Diagnosis not present

## 2022-02-01 DIAGNOSIS — R1312 Dysphagia, oropharyngeal phase: Secondary | ICD-10-CM | POA: Diagnosis not present

## 2022-02-02 DIAGNOSIS — I69991 Dysphagia following unspecified cerebrovascular disease: Secondary | ICD-10-CM | POA: Diagnosis not present

## 2022-02-02 DIAGNOSIS — R4701 Aphasia: Secondary | ICD-10-CM | POA: Diagnosis not present

## 2022-02-02 DIAGNOSIS — H53462 Homonymous bilateral field defects, left side: Secondary | ICD-10-CM | POA: Diagnosis not present

## 2022-02-02 DIAGNOSIS — I639 Cerebral infarction, unspecified: Secondary | ICD-10-CM | POA: Diagnosis not present

## 2022-02-02 DIAGNOSIS — G8194 Hemiplegia, unspecified affecting left nondominant side: Secondary | ICD-10-CM | POA: Diagnosis not present

## 2022-02-02 DIAGNOSIS — E119 Type 2 diabetes mellitus without complications: Secondary | ICD-10-CM | POA: Diagnosis not present

## 2022-02-03 DIAGNOSIS — I69991 Dysphagia following unspecified cerebrovascular disease: Secondary | ICD-10-CM | POA: Diagnosis not present

## 2022-02-03 DIAGNOSIS — G8194 Hemiplegia, unspecified affecting left nondominant side: Secondary | ICD-10-CM | POA: Diagnosis not present

## 2022-02-03 DIAGNOSIS — R4701 Aphasia: Secondary | ICD-10-CM | POA: Diagnosis not present

## 2022-02-03 DIAGNOSIS — I639 Cerebral infarction, unspecified: Secondary | ICD-10-CM | POA: Diagnosis not present

## 2022-02-05 DIAGNOSIS — I69991 Dysphagia following unspecified cerebrovascular disease: Secondary | ICD-10-CM | POA: Diagnosis not present

## 2022-02-05 DIAGNOSIS — R4701 Aphasia: Secondary | ICD-10-CM | POA: Diagnosis not present

## 2022-02-05 DIAGNOSIS — I639 Cerebral infarction, unspecified: Secondary | ICD-10-CM | POA: Diagnosis not present

## 2022-02-05 DIAGNOSIS — G8194 Hemiplegia, unspecified affecting left nondominant side: Secondary | ICD-10-CM | POA: Diagnosis not present

## 2022-02-23 DIAGNOSIS — I69354 Hemiplegia and hemiparesis following cerebral infarction affecting left non-dominant side: Secondary | ICD-10-CM | POA: Diagnosis not present

## 2022-02-23 DIAGNOSIS — R279 Unspecified lack of coordination: Secondary | ICD-10-CM | POA: Diagnosis not present

## 2022-02-26 DIAGNOSIS — R279 Unspecified lack of coordination: Secondary | ICD-10-CM | POA: Diagnosis not present

## 2022-02-26 DIAGNOSIS — I69354 Hemiplegia and hemiparesis following cerebral infarction affecting left non-dominant side: Secondary | ICD-10-CM | POA: Diagnosis not present

## 2022-02-27 DIAGNOSIS — R279 Unspecified lack of coordination: Secondary | ICD-10-CM | POA: Diagnosis not present

## 2022-02-27 DIAGNOSIS — I69354 Hemiplegia and hemiparesis following cerebral infarction affecting left non-dominant side: Secondary | ICD-10-CM | POA: Diagnosis not present

## 2022-03-05 DIAGNOSIS — R279 Unspecified lack of coordination: Secondary | ICD-10-CM | POA: Diagnosis not present

## 2022-03-05 DIAGNOSIS — I69354 Hemiplegia and hemiparesis following cerebral infarction affecting left non-dominant side: Secondary | ICD-10-CM | POA: Diagnosis not present

## 2022-03-06 DIAGNOSIS — I69354 Hemiplegia and hemiparesis following cerebral infarction affecting left non-dominant side: Secondary | ICD-10-CM | POA: Diagnosis not present

## 2022-03-06 DIAGNOSIS — R279 Unspecified lack of coordination: Secondary | ICD-10-CM | POA: Diagnosis not present

## 2022-03-29 ENCOUNTER — Other Ambulatory Visit: Payer: Self-pay | Admitting: Family Medicine

## 2022-03-31 NOTE — Telephone Encounter (Signed)
Please schedule for Medicare wellness and CPE and labs with Dr Reece Agar. Overdue now.

## 2022-03-31 NOTE — Telephone Encounter (Signed)
Called pt & spoke to Wife, Thomas Howard & she stated they recently moved to [place]. Thomas Howard stated pt is now seeing new provider to refill meds. Call back # [phone]
